# Patient Record
Sex: Male | Born: 1945
Health system: Southern US, Community
[De-identification: ages and names within clinical notes are randomized; demographics above are authoritative.]

## PROBLEM LIST (undated history)

## (undated) DIAGNOSIS — Z8719 Personal history of other diseases of the digestive system: Secondary | ICD-10-CM

## (undated) DIAGNOSIS — E662 Morbid (severe) obesity with alveolar hypoventilation: Principal | ICD-10-CM

## (undated) DIAGNOSIS — M1990 Unspecified osteoarthritis, unspecified site: Secondary | ICD-10-CM

## (undated) DIAGNOSIS — H269 Unspecified cataract: Secondary | ICD-10-CM

## (undated) DIAGNOSIS — K5900 Constipation, unspecified: Secondary | ICD-10-CM

## (undated) DIAGNOSIS — Z87898 Personal history of other specified conditions: Secondary | ICD-10-CM

## (undated) DIAGNOSIS — IMO0002 Reserved for concepts with insufficient information to code with codable children: Secondary | ICD-10-CM

## (undated) DIAGNOSIS — G8929 Other chronic pain: Secondary | ICD-10-CM

## (undated) DIAGNOSIS — D649 Anemia, unspecified: Secondary | ICD-10-CM

## (undated) DIAGNOSIS — T4145XA Adverse effect of unspecified anesthetic, initial encounter: Secondary | ICD-10-CM

## (undated) DIAGNOSIS — M5136 Other intervertebral disc degeneration, lumbar region: Secondary | ICD-10-CM

## (undated) DIAGNOSIS — I10 Essential (primary) hypertension: Secondary | ICD-10-CM

## (undated) DIAGNOSIS — M51369 Other intervertebral disc degeneration, lumbar region without mention of lumbar back pain or lower extremity pain: Secondary | ICD-10-CM

## (undated) DIAGNOSIS — K219 Gastro-esophageal reflux disease without esophagitis: Secondary | ICD-10-CM

## (undated) DIAGNOSIS — I4510 Unspecified right bundle-branch block: Secondary | ICD-10-CM

## (undated) DIAGNOSIS — E1140 Type 2 diabetes mellitus with diabetic neuropathy, unspecified: Secondary | ICD-10-CM

## (undated) DIAGNOSIS — C801 Malignant (primary) neoplasm, unspecified: Secondary | ICD-10-CM

## (undated) DIAGNOSIS — I440 Atrioventricular block, first degree: Secondary | ICD-10-CM

## (undated) DIAGNOSIS — R112 Nausea with vomiting, unspecified: Secondary | ICD-10-CM

## (undated) DIAGNOSIS — G4730 Sleep apnea, unspecified: Secondary | ICD-10-CM

## (undated) DIAGNOSIS — Z9989 Dependence on other enabling machines and devices: Secondary | ICD-10-CM

## (undated) DIAGNOSIS — T7840XA Allergy, unspecified, initial encounter: Secondary | ICD-10-CM

## (undated) DIAGNOSIS — N529 Male erectile dysfunction, unspecified: Secondary | ICD-10-CM

## (undated) DIAGNOSIS — R351 Nocturia: Secondary | ICD-10-CM

## (undated) DIAGNOSIS — M549 Dorsalgia, unspecified: Secondary | ICD-10-CM

## (undated) DIAGNOSIS — T8859XA Other complications of anesthesia, initial encounter: Secondary | ICD-10-CM

## (undated) DIAGNOSIS — K760 Fatty (change of) liver, not elsewhere classified: Secondary | ICD-10-CM

## (undated) DIAGNOSIS — M479 Spondylosis, unspecified: Secondary | ICD-10-CM

## (undated) DIAGNOSIS — H8110 Benign paroxysmal vertigo, unspecified ear: Secondary | ICD-10-CM

## (undated) DIAGNOSIS — R0683 Snoring: Secondary | ICD-10-CM

## (undated) DIAGNOSIS — G4733 Obstructive sleep apnea (adult) (pediatric): Secondary | ICD-10-CM

## (undated) DIAGNOSIS — E119 Type 2 diabetes mellitus without complications: Secondary | ICD-10-CM

## (undated) HISTORY — PX: TONSILLECTOMY: SUR1361

## (undated) HISTORY — DX: Sleep apnea, unspecified: G47.30

## (undated) HISTORY — PX: WISDOM TOOTH EXTRACTION: SHX21

## (undated) HISTORY — DX: Unspecified cataract: H26.9

## (undated) HISTORY — DX: Essential (primary) hypertension: I10

## (undated) HISTORY — DX: Dependence on other enabling machines and devices: Z99.89

## (undated) HISTORY — DX: Snoring: R06.83

## (undated) HISTORY — DX: Morbid (severe) obesity with alveolar hypoventilation: E66.2

## (undated) HISTORY — PX: CARDIOVASCULAR STRESS TEST: SHX262

## (undated) HISTORY — PX: OTHER SURGICAL HISTORY: SHX169

## (undated) HISTORY — DX: Nocturia: R35.1

## (undated) HISTORY — DX: Allergy, unspecified, initial encounter: T78.40XA

## (undated) HISTORY — DX: Dorsalgia, unspecified: M54.9

## (undated) HISTORY — DX: Unspecified osteoarthritis, unspecified site: M19.90

## (undated) HISTORY — PX: BACK SURGERY: SHX140

## (undated) HISTORY — DX: Morbid (severe) obesity due to excess calories: E66.01

## (undated) HISTORY — DX: Obstructive sleep apnea (adult) (pediatric): G47.33

## (undated) HISTORY — DX: Type 2 diabetes mellitus without complications: E11.9

---

## 2004-05-21 HISTORY — PX: COLONOSCOPY: SHX174

## 2005-03-05 ENCOUNTER — Ambulatory Visit: Payer: Self-pay | Admitting: Gastroenterology

## 2005-03-13 ENCOUNTER — Ambulatory Visit: Payer: Self-pay | Admitting: Gastroenterology

## 2005-03-13 ENCOUNTER — Encounter (INDEPENDENT_AMBULATORY_CARE_PROVIDER_SITE_OTHER): Payer: Self-pay | Admitting: Specialist

## 2005-08-20 ENCOUNTER — Encounter: Payer: Self-pay | Admitting: Vascular Surgery

## 2005-08-20 ENCOUNTER — Ambulatory Visit (HOSPITAL_COMMUNITY): Admission: RE | Admit: 2005-08-20 | Discharge: 2005-08-20 | Payer: Self-pay | Admitting: Internal Medicine

## 2010-01-18 ENCOUNTER — Encounter (INDEPENDENT_AMBULATORY_CARE_PROVIDER_SITE_OTHER): Payer: Self-pay | Admitting: *Deleted

## 2010-03-23 ENCOUNTER — Encounter (INDEPENDENT_AMBULATORY_CARE_PROVIDER_SITE_OTHER): Payer: Self-pay | Admitting: *Deleted

## 2010-05-01 ENCOUNTER — Encounter (INDEPENDENT_AMBULATORY_CARE_PROVIDER_SITE_OTHER): Payer: Self-pay | Admitting: *Deleted

## 2010-05-04 ENCOUNTER — Ambulatory Visit: Payer: Self-pay | Admitting: Gastroenterology

## 2010-05-25 ENCOUNTER — Ambulatory Visit
Admission: RE | Admit: 2010-05-25 | Discharge: 2010-05-25 | Payer: Self-pay | Source: Home / Self Care | Attending: Gastroenterology | Admitting: Gastroenterology

## 2010-05-25 ENCOUNTER — Encounter: Payer: Self-pay | Admitting: Gastroenterology

## 2010-05-28 ENCOUNTER — Encounter: Payer: Self-pay | Admitting: Gastroenterology

## 2010-06-22 NOTE — Miscellaneous (Signed)
Summary: [facility] previsit  Clinical Lists Changes  Medications: Added new medication of MOVIPREP 100 GM  SOLR (PEG-KCL-NACL-NASULF-NA ASC-C) As per prep instructions. - Signed Rx of MOVIPREP 100 GM  SOLR (PEG-KCL-NACL-NASULF-NA ASC-C) As per prep instructions.;  #1 x 0;  Signed;  Entered by: Karl Bales RN;  Authorized by: Meryl Dare MD [facility];  Method used: Electronically to [facility] [address], [place], [place], [place]  [address], Ph: [phone], Fax: [phone] Observations: Added new observation of NKA: T (05/04/2010 9:01)    Prescriptions: MOVIPREP 100 GM  SOLR (PEG-KCL-NACL-NASULF-NA ASC-C) As per prep instructions.  #1 x 0   Entered by:   Karl Bales RN   Authorized by:   Meryl Dare MD [facility]   Signed by:   Karl Bales RN on 05/04/2010   Method used:   Electronically to        [facility] (retail)       [address]       [place], [place]  [address]       Ph: [phone]       Fax: [phone]   RxID:   [id number]

## 2010-06-22 NOTE — Letter (Signed)
Summary: Colonoscopy Letter  [facility]  [address] [place], [place] [address]   Phone: [phone]  Fax: [phone]      January 18, 2010 MRN: [id number]   Tommy [name] [address] [place], [place]  [address]   Dear [name],   According to your medical record, it is time for you to schedule a Colonoscopy. The American Cancer Society recommends this procedure as a method to detect early colon cancer. Patients with a family history of colon cancer, or a personal history of colon polyps or inflammatory bowel disease are at increased risk.  This letter has beeen generated based on the recommendations made at the time of your procedure. If you feel that in your particular situation this may no longer apply, please contact our office.  Please call our office at [phone] to schedule this appointment or to update your records at your earliest convenience.  Thank you for cooperating with [place] to provide you with the very best care possible.   Sincerely,  Judie Petit T. Russella Dar, M.D.  [facility] Gastroenterology Division [phone]

## 2010-06-22 NOTE — Letter (Signed)
Summary: Patient Notice- Colon Biospy Results  [facility]  [address] [place], [place] [address]   Phone: [phone]  Fax: [phone]        May 28, 2010 MRN: [id number]    Tommy [name] [address] [place], [place]  [address]    Dear [name],  I am pleased to inform you that the biopsies taken during your recent colonoscopy did not show any evidence of cancer upon pathologic examination. The biopies showed reactive changes.  Continue with the treatment plan as outlined on the day of your      exam.  You should have a repeat colonoscopy examination in 5 years.  Please call us if you are having persistent problems or have questions about your condition that have not been fully answered at this time.  Sincerely,  Meryl Dare MD [facility]  This letter has been electronically signed by your physician.  Appended Document: Patient Notice- Colon Biospy Results Letter mailed

## 2010-06-22 NOTE — Procedures (Signed)
Summary: Colonoscopy  Patient: Tommy [name] Note: All result statuses are Final unless otherwise noted.  Tests: (1) Colonoscopy (COL)   COL Colonoscopy           DONE (C)     [facility]     [address]     [place], [place]  [address]           COLONOSCOPY PROCEDURE REPORT           PATIENT:  Tommy [name], Tommy [name]  MR#:  [id number]     BIRTHDATE:  30-Jul-1945, 64 yrs. old  GENDER:  male     ENDOSCOPIST:  Judie Petit T. Russella Dar, MD, [facility]           PROCEDURE DATE:  05/25/2010     PROCEDURE:  Colonoscopy with biopsy     ASA CLASS:  Class II     INDICATIONS:  1) surveillance and high-risk screening  2) history     of adenomatous colon polyps: 10/2001     MEDICATIONS:   Fentanyl 125 mcg IV, Versed 10 mg IV, Benadryl 25     mg IV           DESCRIPTION OF PROCEDURE:   After the risks benefits and     alternatives of the procedure were thoroughly explained, informed     consent was obtained.  Digital rectal exam was performed and     revealed no abnormalities.   The LB CF-[id number] endoscope     was introduced through the anus and advanced to the cecum, which     was identified by both the appendix and ileocecal valve, without     limitations.  The quality of the prep was good, using MoviPrep.     The instrument was then slowly withdrawn as the colon was fully     examined.     <<PROCEDUREIMAGES>>     FINDINGS:  Several small ulcers at the ileocecal valve. They were     benign appearing. Multiple biopsies were obtained and sent to     pathology.  Moderate diverticulosis was found in the sigmoid     colon. This was otherwise a normal examination of the colon.     Retroflexed views in the rectum revealed no abnormalities. The     time to cecum =  9  minutes. The scope was then withdrawn (time =     12.75  min) from the patient and the procedure completed.           COMPLICATIONS:  None           ENDOSCOPIC IMPRESSION:     1) Ulcers at the ileocecal valve     2)  Moderate diverticulosis in the sigmoid colon           RECOMMENDATIONS:     1) Await pathology results     2) High fiber diet with liberal fluid intake.     3) Repeat Colonoscopy in 5 years.     4) Consider MAC sedation for future procedures           Amira Podolak T. Russella Dar, MD, [facility]           CC: Creola Corn, MD           n.     REVISED:  05/25/2010 09:43 AM     eSIGNED:   Judie Petit T. Surie Suchocki at 05/25/2010 09:43 AM           Hyacinth Meeker, [id number]  Note:  An exclamation mark (!) indicates a result that was not dispersed into the flowsheet. Document Creation Date: 05/25/2010 9:43 AM _______________________________________________________________________  (1) Order result status: Final Collection or observation date-time: 05/25/2010 09:30 Requested date-time:  Receipt date-time:  Reported date-time:  Referring Physician:   Ordering Physician: Claudette Head [phone]) Specimen Source:  Source: Launa Grill Order Number: [id number] Lab site:   Appended Document: Colonoscopy     Procedures Next Due Date:    Colonoscopy: 05/2015

## 2010-06-22 NOTE — Letter (Signed)
Summary: [facility] Instructions  [facility]  [address] [place], [place] [address]   Phone: [phone]  Fax: [phone]       Tommy [name]    [birth year]    MRN: [id number]        Procedure Day /Date:  Thursday 05/25/2010     Arrival Time: 8:30 am     Procedure Time: 9:30 am     Location of Procedure:                    _x_  [facility] (4th Floor)                        PREPARATION FOR COLONOSCOPY WITH MOVIPREP   Starting 5 days prior to your procedure Saturday 12/31 do not eat nuts, seeds, popcorn, corn, beans, peas,  salads, or any raw vegetables.  Do not take any fiber supplements (e.g. Metamucil, Citrucel, and Benefiber).  THE DAY BEFORE YOUR PROCEDURE         DATE: Wednesday 1/4  1.  Drink clear liquids the entire day-NO SOLID FOOD  2.  Do not drink anything colored red or purple.  Avoid juices with pulp.  No orange juice.  3.  Drink at least 64 oz. (8 glasses) of fluid/clear liquids during the day to prevent dehydration and help the prep work efficiently.  CLEAR LIQUIDS INCLUDE: Water Jello Ice Popsicles Tea (sugar ok, no milk/cream) Powdered fruit flavored drinks Coffee (sugar ok, no milk/cream) Gatorade Juice: apple, white grape, white cranberry  Lemonade Clear bullion, consomm, broth Carbonated beverages (any kind) Strained chicken noodle soup Hard Candy                             4.  In the morning, mix first dose of MoviPrep solution:    Empty 1 Pouch A and 1 Pouch B into the disposable container    Add lukewarm drinking water to the top line of the container. Mix to dissolve    Refrigerate (mixed solution should be used within 24 hrs)  5.  Begin drinking the prep at 5:00 p.m. The MoviPrep container is divided by 4 marks.   Every 15 minutes drink the solution down to the next mark (approximately 8 oz) until the full liter is complete.   6.  Follow completed prep with 16 oz of clear liquid of your choice  (Nothing red or purple).  Continue to drink clear liquids until bedtime.  7.  Before going to bed, mix second dose of MoviPrep solution:    Empty 1 Pouch A and 1 Pouch B into the disposable container    Add lukewarm drinking water to the top line of the container. Mix to dissolve    Refrigerate  THE DAY OF YOUR PROCEDURE      DATE: Thursday 1/5  Beginning at 4:30 a.m. (5 hours before procedure):         1. Every 15 minutes, drink the solution down to the next mark (approx 8 oz) until the full liter is complete.  2. Follow completed prep with 16 oz. of clear liquid of your choice.    3. You may drink clear liquids until 7:30 am (2 HOURS BEFORE PROCEDURE).   MEDICATION INSTRUCTIONS  Unless otherwise instructed, you should take regular prescription medications with a small sip of water   as early as possible the morning of your procedure.  OTHER INSTRUCTIONS  You will need a responsible adult at least 65 years of age to accompany you and drive you home.   This person must remain in the waiting room during your procedure.  Wear loose fitting clothing that is easily removed.  Leave jewelry and other valuables at home.  However, you may wish to bring a book to read or  an iPod/MP3 player to listen to music as you wait for your procedure to start.  Remove all body piercing jewelry and leave at home.  Total time from sign-in until discharge is approximately 2-3 hours.  You should go home directly after your procedure and rest.  You can resume normal activities the  day after your procedure.  The day of your procedure you should not:   Drive   Make legal decisions   Operate machinery   Drink alcohol   Return to work  You will receive specific instructions about eating, activities and medications before you leave.    The above instructions have been reviewed and explained to me by   Karl Bales RN  May 04, 2010 9:24 AM    I fully understand  and can verbalize these instructions _____________________________ Date _________

## 2010-06-22 NOTE — Letter (Signed)
Summary: Pre Visit Letter Revised  [facility]  [address] [place], [place] [address]   Phone: [phone]  Fax: [phone]        03/23/2010 MRN: [id number] Tommy [name] [address] [place], [place]  [address]             Procedure Date:  05-25-10   Welcome to the Gastroenterology Division at [facility].    You are scheduled to see a nurse for your pre-procedure visit on 05-04-10 at 9:00a.m. on the 3rd floor at [facility], [address]  We ask that you try to arrive at our office 15 minutes prior to your appointment time to allow for check-in.  Please take a minute to review the attached form.  If you answer "Yes" to one or more of the questions on the first page, we ask that you call the person listed at your earliest opportunity.  If you answer "No" to all of the questions, please complete the rest of the form and bring it to your appointment.    Your nurse visit will consist of discussing your medical and surgical history, your immediate family medical history, and your medications.   If you are unable to list all of your medications on the form, please bring the medication bottles to your appointment and we will list them.  We will need to be aware of both prescribed and over the counter drugs.  We will need to know exact dosage information as well.    Please be prepared to read and sign documents such as consent forms, a financial agreement, and acknowledgement forms.  If necessary, and with your consent, a friend or relative is welcome to sit-in on the nurse visit with you.  Please bring your insurance card so that we may make a copy of it.  If your insurance requires a referral to see a specialist, please bring your referral form from your primary care physician.  No co-pay is required for this nurse visit.     If you cannot keep your appointment, please call [phone] to cancel or reschedule prior to your appointment date.  This allows [place]  the opportunity to schedule an appointment for another patient in need of care.    Thank you for choosing [facility] for your medical needs.  We appreciate the opportunity to care for you.  Please visit [place] at our website  to learn more about our practice.  Sincerely, The Gastroenterology Division

## 2010-06-29 ENCOUNTER — Encounter (HOSPITAL_BASED_OUTPATIENT_CLINIC_OR_DEPARTMENT_OTHER)
Admission: RE | Admit: 2010-06-29 | Discharge: 2010-06-29 | Disposition: A | Discharge status: Home / Self Care | Payer: Medicare Other | Source: Ambulatory Visit | Attending: General Surgery | Admitting: General Surgery

## 2010-06-29 ENCOUNTER — Other Ambulatory Visit: Payer: Self-pay | Admitting: General Surgery

## 2010-06-29 ENCOUNTER — Ambulatory Visit
Admission: RE | Admit: 2010-06-29 | Discharge: 2010-06-29 | Disposition: A | Discharge status: Home / Self Care | Payer: Medicare Other | Source: Ambulatory Visit | Attending: General Surgery | Admitting: General Surgery

## 2010-06-29 DIAGNOSIS — Z01811 Encounter for preprocedural respiratory examination: Secondary | ICD-10-CM

## 2010-06-29 DIAGNOSIS — Z01812 Encounter for preprocedural laboratory examination: Secondary | ICD-10-CM | POA: Insufficient documentation

## 2010-06-29 DIAGNOSIS — Z01810 Encounter for preprocedural cardiovascular examination: Secondary | ICD-10-CM | POA: Insufficient documentation

## 2010-06-29 LAB — BASIC METABOLIC PANEL
BUN: 13 mg/dL (ref 6–23)
Chloride: 103 mEq/L (ref 96–112)
GFR calc Af Amer: >60 mL/min (ref >60)
Potassium: 3.4 mEq/L — ABNORMAL LOW (ref 3.5–5.1)

## 2010-06-29 LAB — DIFFERENTIAL
Eosinophils Absolute: 0.2 10*3/uL (ref 0.0–0.7)
Lymphs Abs: 1.4 10*3/uL (ref 0.7–4.0)
Neutrophils Relative %: 66 % (ref 43–77)

## 2010-06-29 LAB — CBC
MCV: 88.6 fL (ref 78.0–100.0)
Platelets: 252 10*3/uL (ref 150–400)
RBC: 4.74 MIL/uL (ref 4.22–5.81)
WBC: 7.1 10*3/uL (ref 4.0–10.5)

## 2010-07-04 ENCOUNTER — Ambulatory Visit (HOSPITAL_BASED_OUTPATIENT_CLINIC_OR_DEPARTMENT_OTHER)
Admission: RE | Admit: 2010-07-04 | Discharge: 2010-07-04 | Disposition: A | Discharge status: Home / Self Care | Payer: Medicare Other | Attending: General Surgery | Admitting: General Surgery

## 2010-07-04 ENCOUNTER — Other Ambulatory Visit: Payer: Self-pay | Admitting: General Surgery

## 2010-07-04 DIAGNOSIS — I10 Essential (primary) hypertension: Secondary | ICD-10-CM | POA: Insufficient documentation

## 2010-07-04 DIAGNOSIS — Z01810 Encounter for preprocedural cardiovascular examination: Secondary | ICD-10-CM | POA: Insufficient documentation

## 2010-07-04 DIAGNOSIS — D1739 Benign lipomatous neoplasm of skin and subcutaneous tissue of other sites: Secondary | ICD-10-CM | POA: Insufficient documentation

## 2010-07-04 DIAGNOSIS — K219 Gastro-esophageal reflux disease without esophagitis: Secondary | ICD-10-CM | POA: Insufficient documentation

## 2010-07-04 DIAGNOSIS — Z01812 Encounter for preprocedural laboratory examination: Secondary | ICD-10-CM | POA: Insufficient documentation

## 2010-07-04 LAB — POCT HEMOGLOBIN-HEMACUE: Hemoglobin: 13.9 g/dL (ref 13.0–17.0)

## 2010-07-10 NOTE — Op Note (Addendum)
  NAME:  ODA, LANSDOWNE NO.:  [id number]  MEDICAL RECORD NO.:  [id number]          PATIENT TYPE:  AMB  LOCATION:  DSC                          FACILITY:  [facility]  PHYSICIAN:  Cherylynn Ridges, M.D.    DATE OF BIRTH:  11-28-1945  DATE OF PROCEDURE:  07/04/2010 DATE OF DISCHARGE:                              OPERATIVE REPORT   PREOPERATIVE DIAGNOSIS:  Large left posterior thigh mass.  POSTOPERATIVE DIAGNOSIS:  Interdigitating large greater 10 cm left posterior thigh lipoma.  PROCEDURE:  Excision of a left posterior thigh lipoma.  SURGEON:  Cherylynn Ridges, MD  ANESTHESIA:  General with laryngeal airway.  ESTIMATED BLOOD LOSS:  Less than 20 mL.  COMPLICATIONS:  None.  CONDITION:  Stable.  FINDINGS:  Interdigitating large greater than 10 cm posterior thigh lipoma.  OPERATION:  The patient was taken to the operating room, placed on table in supine position.  After an adequate general laryngeal airway anesthetic was administered, he was prepped and draped in usual sterile manner in the lithotomy position.  After proper time-out was done identifying the patient and the procedure to be performed, we went ahead and marked the area of the large lipoma that was lateral to the scrotum on the left side.  The scrotum was kept away with a scrotal sling.  Prep was adequate.  Once we marked the area, used a 15 blade to make an oval incision, sort of diagonally from the medial aspect of the thigh laterally and posteriorly.  This oval incision had a maximal width of about 2 cm.  We took with it a significant amount of redundant skin.  In the subcutaneous tissue, this large interdigitating multifaceted lipoma was excised from the surrounding tissue using hemostat clamps in order to dissect that of little pocket that went throughout the subcu.  We removed all the inner digitation there were obvious and noted.  We obtained hemostasis with electrocautery.  We removed the  specimen and sent it separately.  We then reapproximated the area using a deep subcutaneous stitch of 2-0 Vicryl, more superficial layer of 3-0 Vicryl then the skin was closed using stainless steel staples.  A sort of an S-shaped incision headed towards the scrotum, measured approximately 12 cm long. All counts were correct including needles, sponges, and instruments. Sterile dressing was applied.     Cherylynn Ridges, M.D.     JOW/MEDQ  D:  07/04/2010  T:  07/05/2010  Job:  [id number]  Electronically Signed by Jimmye Norman M.D. on 07/10/2010 12:50:14 PM

## 2012-05-21 DIAGNOSIS — M479 Spondylosis, unspecified: Secondary | ICD-10-CM

## 2012-05-21 HISTORY — DX: Spondylosis, unspecified: M47.9

## 2012-05-26 ENCOUNTER — Ambulatory Visit
Admission: RE | Admit: 2012-05-26 | Discharge: 2012-05-26 | Disposition: A | Discharge status: Home / Self Care | Payer: Medicare Other | Source: Ambulatory Visit | Attending: Orthopedic Surgery | Admitting: Orthopedic Surgery

## 2012-05-26 ENCOUNTER — Other Ambulatory Visit: Payer: Self-pay | Admitting: Orthopedic Surgery

## 2012-05-26 DIAGNOSIS — M545 Low back pain, unspecified: Secondary | ICD-10-CM

## 2012-05-26 MED ORDER — GADOBENATE DIMEGLUMINE 529 MG/ML IV SOLN
20.0000 mL | Freq: Once | INTRAVENOUS | Status: AC | PRN
  Administered 2012-05-26: 20 mL via INTRAVENOUS

## 2012-05-27 ENCOUNTER — Other Ambulatory Visit: Payer: Medicare Other

## 2012-06-10 ENCOUNTER — Other Ambulatory Visit: Payer: Self-pay

## 2012-06-13 ENCOUNTER — Encounter (HOSPITAL_COMMUNITY): Payer: Self-pay | Admitting: Pharmacy Technician

## 2012-06-13 DIAGNOSIS — M5116 Intervertebral disc disorders with radiculopathy, lumbar region: Secondary | ICD-10-CM | POA: Diagnosis present

## 2012-06-13 NOTE — H&P (Signed)
History of Present Illness The patient is a 67 year old male who presents today for follow up of their back. The patient is being followed for their left-sided (worse) back pain. The patient states that they are doing poorly. The following medication has been used for pain control: Hydrocodone (5/325 PCP). The patient presents today following MRI. Note for "Follow-up back": Fell to knees in therapy this morning.    Subjective Transcription  Tommy [name] returns today for follow up with his MRI. He continues to have significant back pain, numbness, tingling and severe left leg pain. He states that his symptoms have gotten worse since Thanksgiving. The remainder of his past medical, surgical, family, social history is unchanged.    Allergies No Known Drug Allergies.   Social History Tobacco use. never smoker   Medication History TraMADol HCl (50MG  Tablet, 1 (one) Tablet Oral q 8hrs prn pain, Taken starting 05/23/2012) Active. (ddb/lawl via escribe) Diovan HCT (320-25MG  Tablet, 1 Oral daily) Active. Norvasc (10MG  Tablet, 1 Oral daily) Active. Singulair (10MG  Tablet, Oral daily) Active. Nasonex (50MCG/ACT Suspension, Nasal) Active. Atrovent (0.06% Solution, Nasal as needed) Active. Naproxen DR (500MG  Tablet DR, Oral two times daily) Active. Aspirin (81MG  Tablet, 1 (one) Oral) Active. Glucosamine Complex (1 (one) Oral) Active. Fiber Capsule ( daily) Active. Multiple Vitamin (1 (one) Oral) Active. Methocarbamol (750MG  Tablet, 1 Oral q 6-8 hrs prn) Active. Hydrocodone-Acetaminophen (10-650MG  Tablet, 1 Oral q 6-8 hrs prn pain) Active.   Objective Transcription  He is a pleasant gentleman who appears his stated age in no acute distress. He is alert and oriented times three. He has no shortness of breath or chest pain. Lungs clear to auscultation. Heart regular rate and rhythm, no rubs, gallops or murmurs on my exam. Abdomen is soft, nontender. No history of incontinence of  bowel or bladder. Peripheral pulses are 2+ and intact. Compartments soft, nontender. He has significant left quad and hip flexor weakness both being about 3+ to 4-/5. Tibialis anterior, EHL, gastrocnemius are grossly intact. Right lower extremity is asymptomatic with 5/5 strength. He has no real hip, knee or ankle pain with joint range of motion.    RADIOGRAPHS:  MRI from 05/26/12 demonstrates significant degenerative discs at L4-5 with Modic endplate changes causing moderate spinal stenosis. He also has a disc herniation at L2-3 on the left side causing compression of the traversing L3 nerve roots.    Plans Transcription  At this point in time we have had a long discussion about his disc herniation and his degenerative disc disease. He states that the left thigh pain and weakness is his major source of problem. At this point while his degenerated disc can cause problems for him clinically he is having and I believe the L2-3 disc herniation to the left is his principle source of pathology. We have discussed performing a lumbar decompression discectomy at L2-3 to alleviate the pressure on the L3 nerve root which I think is the best treatment plan. He may require a fusion surgery later on at that L4-5 level but at this point I do think his principle source of disability is the new disc herniation at L2-3. We reviewed the risks which include infection, bleeding, nerve damage, death, stroke, paralysis, failure to heal, need for further surgery, ongoing or worse pain, need for fusion surgery, no improvements in his quality of life or pain. He has expressed an understanding of the risks and benefits and the surgical procedure itself and rationale for said procedure. We will proceed with this  in a timely fashion.  Medical Clearance provided by Dr Thornell Sartorius, MD

## 2012-06-17 ENCOUNTER — Ambulatory Visit (HOSPITAL_COMMUNITY)
Admission: RE | Admit: 2012-06-17 | Discharge: 2012-06-17 | Disposition: A | Discharge status: Home / Self Care | Payer: Medicare Other | Source: Ambulatory Visit | Attending: Anesthesiology | Admitting: Anesthesiology

## 2012-06-17 ENCOUNTER — Encounter (HOSPITAL_COMMUNITY)
Admission: RE | Admit: 2012-06-17 | Discharge: 2012-06-17 | Disposition: A | Discharge status: Home / Self Care | Payer: Medicare Other | Source: Ambulatory Visit | Attending: Orthopedic Surgery | Admitting: Orthopedic Surgery

## 2012-06-17 ENCOUNTER — Encounter (HOSPITAL_COMMUNITY): Payer: Self-pay

## 2012-06-17 HISTORY — DX: Anemia, unspecified: D64.9

## 2012-06-17 HISTORY — DX: Essential (primary) hypertension: I10

## 2012-06-17 HISTORY — DX: Personal history of other diseases of the digestive system: Z87.19

## 2012-06-17 HISTORY — DX: Malignant (primary) neoplasm, unspecified: C80.1

## 2012-06-17 HISTORY — DX: Reserved for concepts with insufficient information to code with codable children: IMO0002

## 2012-06-17 HISTORY — DX: Dorsalgia, unspecified: M54.9

## 2012-06-17 HISTORY — DX: Other chronic pain: G89.29

## 2012-06-17 HISTORY — DX: Gastro-esophageal reflux disease without esophagitis: K21.9

## 2012-06-17 LAB — BASIC METABOLIC PANEL
BUN: 17 mg/dL (ref 6–23)
Chloride: 100 mEq/L (ref 96–112)
Creatinine, Ser: 0.79 mg/dL (ref 0.50–1.35)
GFR calc Af Amer: >90 mL/min (ref >90)
GFR calc non Af Amer: >90 mL/min (ref >90)

## 2012-06-17 LAB — CBC
HCT: 41.8 % (ref 39.0–52.0)
MCHC: 33.3 g/dL (ref 30.0–36.0)
Platelets: 256 10*3/uL (ref 150–400)
RDW: 14.4 % (ref 11.5–15.5)
WBC: 10 10*3/uL (ref 4.0–10.5)

## 2012-06-17 LAB — SURGICAL PCR SCREEN
MRSA, PCR: NEGATIVE
Staphylococcus aureus: NEGATIVE

## 2012-06-17 NOTE — Progress Notes (Signed)
06/17/12 1421  OBSTRUCTIVE SLEEP APNEA  Have you ever been diagnosed with sleep apnea through a sleep study? No  Do you snore loudly (loud enough to be heard through closed doors)?  0  Do you often feel tired, fatigued, or sleepy during the daytime? 1  Has anyone observed you stop breathing during your sleep? 1  Do you have, or are you being treated for high blood pressure? 1  BMI more than 35 kg/m2? 1  Age over 67 years old? 1  Neck circumference greater than 40 cm/18 inches? 0 (20 inches)  Obstructive Sleep Apnea Score 5   Score 4 or greater  Results sent to PCP

## 2012-06-17 NOTE — Progress Notes (Signed)
Contacted Dr. Shon Baton office, left message with surgery scheduler, Lavonna Rua, unable to request order for TED hose, do not carry patients size.

## 2012-06-17 NOTE — Consult Note (Signed)
Anesthesia consult:  Patient is a 67 year old male scheduled for L2-3 left diskectomy on 06/19/12 by Dr. Shon Baton  History includes HTN, GERD, hiatal hernia, anemia, melanoma, morbid obesity (BMI 46), fatty liver, remote history of prior back surgery, excision of left thigh lipoma on 07/04/10.  OSA screening score was a 5. PCP is Dr. Timothy Lasso.  Patient reports that Dr. Timothy Lasso is aware of planned procedure.  EKG on 06/17/12 showed NSR, right BBB, LAFB, bifascicular block.  Right BBB appears new since 06/29/10.  Patient denies recent cardiac testing.  His last stress test was ~ 10 years ago. (No previous EKGs received from PCP - "no cardio" records per office.)  CXR showed no acute disease of the chest.  Pre-operative CBC, BMET noted.  Patient denies chest pain, SOB.  He has not been very active since Thanksgiving due to severe back and LLE pain.  Prior to that he was able to walk one mile and was able to due some digging and heavy lifting when enclosing his carport.    Exam shows a pleasant, obese male in no acute distress.  Heart RRR, no murmurs noted.  Lungs clear.  Trace pre-tibial edema.  No carotid bruits noted.  I reviewed history and EKG findings with anesthesiologist Dr. Michelle Piper.  Patient is currently asymptomatic from a CV standpoint and was walking up to a mile prior to his back pain two months ago.  Anticipate he can proceed if no significant change in his status.  Shonna Chock, PA-C 06/18/12 1555

## 2012-06-17 NOTE — Pre-Procedure Instructions (Signed)
NATTHEW MARLATT  06/17/2012   Your procedure is scheduled on:  Thursday June 19, 2012  Report to [facility] at 1:30PM.  Call this number if you have problems the morning of surgery: [phone]   Remember:   Do not eat food or drink liquids after midnight.   Take these medicines the morning of surgery with A SIP OF WATER: amlodipine, nasonex, singulair, oxycodone,    Do not wear jewelry, make-up or nail polish.  Do not wear lotions, powders, or perfumes.  Do not shave 48 hours prior to surgery. Men may shave face and neck.  Do not bring valuables to the hospital.  Contacts, dentures or bridgework may not be worn into surgery.  Leave suitcase in the car. After surgery it may be brought to your room.  For patients admitted to the hospital, checkout time is 11:00 AM the day of  discharge.   Patients discharged the day of surgery will not be allowed to drive  home.  Name and phone number of your driver: family / friend  Special Instructions: Shower using CHG 2 nights before surgery and the night before surgery.  If you shower the day of surgery use CHG.  Use special wash - you have one bottle of CHG for all showers.  You should use approximately 1/3 of the bottle for each shower.   Please read over the following fact sheets that you were given: Pain Booklet, Coughing and Deep Breathing, MRSA Information and Surgical Site Infection Prevention

## 2012-06-18 MED ORDER — DEXTROSE 5 % IV SOLN
3.0000 g | INTRAVENOUS | Status: DC
  Filled 2012-06-18: qty 3000

## 2012-06-18 MED ORDER — ACETAMINOPHEN 10 MG/ML IV SOLN
1000.0000 mg | Freq: Once | INTRAVENOUS | Status: DC
  Filled 2012-06-18: qty 100

## 2012-06-19 ENCOUNTER — Encounter (HOSPITAL_COMMUNITY): Payer: Self-pay | Admitting: Vascular Surgery

## 2012-06-19 ENCOUNTER — Encounter (HOSPITAL_COMMUNITY)
Admission: RE | Disposition: A | Discharge status: Home / Self Care | Payer: Self-pay | Source: Ambulatory Visit | Attending: Orthopedic Surgery

## 2012-06-19 ENCOUNTER — Encounter (HOSPITAL_COMMUNITY): Payer: Self-pay | Admitting: *Deleted

## 2012-06-19 ENCOUNTER — Observation Stay (HOSPITAL_COMMUNITY)
Admission: RE | Admit: 2012-06-19 | Discharge: 2012-06-20 | DRG: 490 | Disposition: A | Discharge status: Home / Self Care | Payer: Medicare Other | Source: Ambulatory Visit | Attending: Orthopedic Surgery | Admitting: Orthopedic Surgery

## 2012-06-19 ENCOUNTER — Ambulatory Visit (HOSPITAL_COMMUNITY): Payer: Medicare Other

## 2012-06-19 ENCOUNTER — Ambulatory Visit (HOSPITAL_COMMUNITY): Payer: Medicare Other | Admitting: Vascular Surgery

## 2012-06-19 DIAGNOSIS — M51379 Other intervertebral disc degeneration, lumbosacral region without mention of lumbar back pain or lower extremity pain: Secondary | ICD-10-CM | POA: Insufficient documentation

## 2012-06-19 DIAGNOSIS — Z01810 Encounter for preprocedural cardiovascular examination: Secondary | ICD-10-CM | POA: Insufficient documentation

## 2012-06-19 DIAGNOSIS — M5137 Other intervertebral disc degeneration, lumbosacral region: Secondary | ICD-10-CM | POA: Insufficient documentation

## 2012-06-19 DIAGNOSIS — Z01818 Encounter for other preprocedural examination: Secondary | ICD-10-CM | POA: Insufficient documentation

## 2012-06-19 DIAGNOSIS — M5126 Other intervertebral disc displacement, lumbar region: Principal | ICD-10-CM | POA: Insufficient documentation

## 2012-06-19 DIAGNOSIS — Z01812 Encounter for preprocedural laboratory examination: Secondary | ICD-10-CM | POA: Insufficient documentation

## 2012-06-19 DIAGNOSIS — M5116 Intervertebral disc disorders with radiculopathy, lumbar region: Secondary | ICD-10-CM | POA: Diagnosis present

## 2012-06-19 DIAGNOSIS — I10 Essential (primary) hypertension: Secondary | ICD-10-CM | POA: Insufficient documentation

## 2012-06-19 HISTORY — PX: LUMBAR LAMINECTOMY/DECOMPRESSION MICRODISCECTOMY: SHX5026

## 2012-06-19 SURGERY — LUMBAR LAMINECTOMY/DECOMPRESSION MICRODISCECTOMY 1 LEVEL
Anesthesia: General | Site: Spine Lumbar | Laterality: Left | Wound class: Clean

## 2012-06-19 MED ORDER — PROPOFOL 10 MG/ML IV BOLUS
INTRAVENOUS | Status: DC | PRN
  Administered 2012-06-19: 280 mg via INTRAVENOUS
  Administered 2012-06-19: 50 mg via INTRAVENOUS

## 2012-06-19 MED ORDER — HEMOSTATIC AGENTS (NO CHARGE) OPTIME
TOPICAL | Status: DC | PRN
  Administered 2012-06-19: 1 via TOPICAL

## 2012-06-19 MED ORDER — VALSARTAN 160 MG PO TABS
320.0000 mg | ORAL_TABLET | Freq: Every day | ORAL | Status: DC
  Administered 2012-06-20: 320 mg via ORAL
  Filled 2012-06-19: qty 2

## 2012-06-19 MED ORDER — SODIUM CHLORIDE 0.9 % IV SOLN
10.0000 mg | INTRAVENOUS | Status: DC | PRN
  Administered 2012-06-19: 50 ug/min via INTRAVENOUS

## 2012-06-19 MED ORDER — ACETAMINOPHEN 10 MG/ML IV SOLN
INTRAVENOUS | Status: AC
  Administered 2012-06-19: 1000 mg via INTRAVENOUS
  Filled 2012-06-19: qty 100

## 2012-06-19 MED ORDER — BUPIVACAINE-EPINEPHRINE PF 0.25-1:200000 % IJ SOLN
INTRAMUSCULAR | Status: AC
  Filled 2012-06-19: qty 30

## 2012-06-19 MED ORDER — METHOCARBAMOL 500 MG PO TABS
500.0000 mg | ORAL_TABLET | Freq: Four times a day (QID) | ORAL | Status: DC | PRN
  Administered 2012-06-20: 500 mg via ORAL
  Filled 2012-06-19 (×2): qty 1

## 2012-06-19 MED ORDER — SODIUM CHLORIDE 0.9 % IJ SOLN: 3.0000 mL | INTRAMUSCULAR | Status: DC | PRN

## 2012-06-19 MED ORDER — MIDAZOLAM HCL 2 MG/2ML IJ SOLN
INTRAMUSCULAR | Status: AC
  Filled 2012-06-19: qty 2

## 2012-06-19 MED ORDER — THROMBIN 20000 UNITS EX SOLR
CUTANEOUS | Status: DC | PRN
  Administered 2012-06-19: 18:00:00

## 2012-06-19 MED ORDER — DEXAMETHASONE SODIUM PHOSPHATE 4 MG/ML IJ SOLN
4.0000 mg | Freq: Four times a day (QID) | INTRAMUSCULAR | Status: DC
  Filled 2012-06-19 (×6): qty 1

## 2012-06-19 MED ORDER — BUPIVACAINE-EPINEPHRINE 0.25% -1:200000 IJ SOLN
INTRAMUSCULAR | Status: DC | PRN
  Administered 2012-06-19: 10 mL

## 2012-06-19 MED ORDER — GLYCOPYRROLATE 0.2 MG/ML IJ SOLN
INTRAMUSCULAR | Status: DC | PRN
  Administered 2012-06-19: .8 mg via INTRAVENOUS

## 2012-06-19 MED ORDER — MIDAZOLAM HCL 2 MG/2ML IJ SOLN
2.0000 mg | Freq: Once | INTRAMUSCULAR | Status: AC
  Administered 2012-06-19: 2 mg via INTRAVENOUS

## 2012-06-19 MED ORDER — OXYCODONE HCL 5 MG/5ML PO SOLN: 5.0000 mg | Freq: Once | ORAL | Status: DC | PRN

## 2012-06-19 MED ORDER — ONDANSETRON HCL 4 MG/2ML IJ SOLN
INTRAMUSCULAR | Status: AC
  Filled 2012-06-19: qty 2

## 2012-06-19 MED ORDER — MORPHINE SULFATE 2 MG/ML IJ SOLN
1.0000 mg | INTRAMUSCULAR | Status: DC | PRN
  Administered 2012-06-20: 2 mg via INTRAVENOUS
  Filled 2012-06-19: qty 1

## 2012-06-19 MED ORDER — FENTANYL CITRATE 0.05 MG/ML IJ SOLN
50.0000 ug | Freq: Once | INTRAMUSCULAR | Status: AC
  Administered 2012-06-19: 100 ug via INTRAVENOUS

## 2012-06-19 MED ORDER — THROMBIN 20000 UNITS EX SOLR
CUTANEOUS | Status: AC
  Filled 2012-06-19: qty 20000

## 2012-06-19 MED ORDER — 0.9 % SODIUM CHLORIDE (POUR BTL) OPTIME
TOPICAL | Status: DC | PRN
  Administered 2012-06-19: 1000 mL

## 2012-06-19 MED ORDER — DEXTROSE 5 % IV SOLN
3.0000 g | INTRAVENOUS | Status: DC
  Filled 2012-06-19: qty 3000

## 2012-06-19 MED ORDER — OXYCODONE HCL 5 MG PO TABS: 5.0000 mg | ORAL_TABLET | Freq: Once | ORAL | Status: DC | PRN

## 2012-06-19 MED ORDER — OXYCODONE HCL 5 MG PO TABS: 10.0000 mg | ORAL_TABLET | ORAL | Status: DC | PRN

## 2012-06-19 MED ORDER — METHOCARBAMOL 100 MG/ML IJ SOLN
500.0000 mg | Freq: Four times a day (QID) | INTRAMUSCULAR | Status: DC | PRN
  Filled 2012-06-19: qty 5

## 2012-06-19 MED ORDER — HYDROMORPHONE HCL PF 1 MG/ML IJ SOLN
0.2500 mg | INTRAMUSCULAR | Status: DC | PRN
  Administered 2012-06-19 (×2): 0.5 mg via INTRAVENOUS

## 2012-06-19 MED ORDER — AMLODIPINE BESYLATE 10 MG PO TABS
10.0000 mg | ORAL_TABLET | Freq: Every morning | ORAL | Status: DC
  Administered 2012-06-20: 10 mg via ORAL
  Filled 2012-06-19: qty 1

## 2012-06-19 MED ORDER — PHENYLEPHRINE HCL 10 MG/ML IJ SOLN
INTRAMUSCULAR | Status: DC | PRN
  Administered 2012-06-19 (×2): 120 ug via INTRAVENOUS
  Administered 2012-06-19: 160 ug via INTRAVENOUS

## 2012-06-19 MED ORDER — FENTANYL CITRATE 0.05 MG/ML IJ SOLN
INTRAMUSCULAR | Status: AC
  Administered 2012-06-19: 100 ug via INTRAVENOUS
  Filled 2012-06-19: qty 2

## 2012-06-19 MED ORDER — ACETAMINOPHEN 10 MG/ML IV SOLN
1000.0000 mg | Freq: Four times a day (QID) | INTRAVENOUS | Status: DC
  Administered 2012-06-19 – 2012-06-20 (×3): 1000 mg via INTRAVENOUS
  Filled 2012-06-19 (×4): qty 100

## 2012-06-19 MED ORDER — DEXAMETHASONE 4 MG PO TABS
4.0000 mg | ORAL_TABLET | Freq: Four times a day (QID) | ORAL | Status: DC
  Administered 2012-06-19 – 2012-06-20 (×3): 4 mg via ORAL
  Filled 2012-06-19 (×6): qty 1

## 2012-06-19 MED ORDER — LACTATED RINGERS IV SOLN: INTRAVENOUS | Status: DC

## 2012-06-19 MED ORDER — HYDROMORPHONE HCL PF 1 MG/ML IJ SOLN
INTRAMUSCULAR | Status: AC
  Administered 2012-06-19: 0.5 mg via INTRAVENOUS
  Filled 2012-06-19: qty 1

## 2012-06-19 MED ORDER — HYDROCHLOROTHIAZIDE 25 MG PO TABS
25.0000 mg | ORAL_TABLET | Freq: Every day | ORAL | Status: DC
  Administered 2012-06-20: 25 mg via ORAL
  Filled 2012-06-19: qty 1

## 2012-06-19 MED ORDER — NEOSTIGMINE METHYLSULFATE 1 MG/ML IJ SOLN
INTRAMUSCULAR | Status: DC | PRN
  Administered 2012-06-19: 5 mg via INTRAVENOUS

## 2012-06-19 MED ORDER — PROMETHAZINE HCL 25 MG/ML IJ SOLN: 6.2500 mg | INTRAMUSCULAR | Status: DC | PRN

## 2012-06-19 MED ORDER — CEFAZOLIN SODIUM 1-5 GM-% IV SOLN
1.0000 g | Freq: Three times a day (TID) | INTRAVENOUS | Status: AC
  Administered 2012-06-19 – 2012-06-20 (×2): 1 g via INTRAVENOUS
  Filled 2012-06-19 (×3): qty 50

## 2012-06-19 MED ORDER — MIDAZOLAM HCL 5 MG/5ML IJ SOLN
INTRAMUSCULAR | Status: DC | PRN
  Administered 2012-06-19: 2 mg via INTRAVENOUS

## 2012-06-19 MED ORDER — FENTANYL CITRATE 0.05 MG/ML IJ SOLN
INTRAMUSCULAR | Status: DC | PRN
  Administered 2012-06-19: 100 ug via INTRAVENOUS
  Administered 2012-06-19 (×2): 50 ug via INTRAVENOUS
  Administered 2012-06-19: 100 ug via INTRAVENOUS
  Administered 2012-06-19: 50 ug via INTRAVENOUS

## 2012-06-19 MED ORDER — ZOLPIDEM TARTRATE 5 MG PO TABS: 5.0000 mg | ORAL_TABLET | Freq: Every evening | ORAL | Status: DC | PRN

## 2012-06-19 MED ORDER — MIDAZOLAM HCL 2 MG/2ML IJ SOLN
1.0000 mg | INTRAMUSCULAR | Status: DC | PRN
  Administered 2012-06-19: 2 mg via INTRAVENOUS

## 2012-06-19 MED ORDER — ONDANSETRON HCL 4 MG/2ML IJ SOLN: 4.0000 mg | INTRAMUSCULAR | Status: DC | PRN

## 2012-06-19 MED ORDER — ARTIFICIAL TEARS OP OINT
TOPICAL_OINTMENT | OPHTHALMIC | Status: DC | PRN
  Administered 2012-06-19: 1 via OPHTHALMIC

## 2012-06-19 MED ORDER — SUCCINYLCHOLINE CHLORIDE 20 MG/ML IJ SOLN
INTRAMUSCULAR | Status: DC | PRN
  Administered 2012-06-19: 160 mg via INTRAVENOUS

## 2012-06-19 MED ORDER — ONDANSETRON HCL 4 MG/2ML IJ SOLN
INTRAMUSCULAR | Status: DC | PRN
  Administered 2012-06-19: 4 mg via INTRAVENOUS

## 2012-06-19 MED ORDER — SODIUM CHLORIDE 0.9 % IV SOLN: 250.0000 mL | INTRAVENOUS | Status: DC

## 2012-06-19 MED ORDER — MENTHOL 3 MG MT LOZG: 1.0000 | LOZENGE | OROMUCOSAL | Status: DC | PRN

## 2012-06-19 MED ORDER — DOCUSATE SODIUM 100 MG PO CAPS
100.0000 mg | ORAL_CAPSULE | Freq: Two times a day (BID) | ORAL | Status: DC
  Administered 2012-06-19 – 2012-06-20 (×2): 100 mg via ORAL
  Filled 2012-06-19 (×2): qty 1

## 2012-06-19 MED ORDER — ROCURONIUM BROMIDE 100 MG/10ML IV SOLN
INTRAVENOUS | Status: DC | PRN
  Administered 2012-06-19: 10 mg via INTRAVENOUS
  Administered 2012-06-19: 50 mg via INTRAVENOUS
  Administered 2012-06-19 (×2): 10 mg via INTRAVENOUS

## 2012-06-19 MED ORDER — SODIUM CHLORIDE 0.9 % IJ SOLN: 3.0000 mL | Freq: Two times a day (BID) | INTRAMUSCULAR | Status: DC

## 2012-06-19 MED ORDER — VALSARTAN-HYDROCHLOROTHIAZIDE 320-25 MG PO TABS: 1.0000 | ORAL_TABLET | Freq: Every morning | ORAL | Status: DC

## 2012-06-19 MED ORDER — LIDOCAINE HCL (CARDIAC) 20 MG/ML IV SOLN
INTRAVENOUS | Status: DC | PRN
  Administered 2012-06-19: 100 mg via INTRAVENOUS

## 2012-06-19 MED ORDER — PHENOL 1.4 % MT LIQD: 1.0000 | OROMUCOSAL | Status: DC | PRN

## 2012-06-19 MED ORDER — LACTATED RINGERS IV SOLN
INTRAVENOUS | Status: DC
  Administered 2012-06-19 (×3): via INTRAVENOUS

## 2012-06-19 SURGICAL SUPPLY — 59 items
BUR EGG ELITE 4.0 (BURR) IMPLANT
BUR MATCHSTICK NEURO 3.0 LAGG (BURR) ×2 IMPLANT
CANISTER SUCTION 2500CC (MISCELLANEOUS) ×2 IMPLANT
CLOTH BEACON ORANGE TIMEOUT ST (SAFETY) ×2 IMPLANT
CLSR STERI-STRIP ANTIMIC 1/2X4 (GAUZE/BANDAGES/DRESSINGS) ×2 IMPLANT
CORDS BIPOLAR (ELECTRODE) ×2 IMPLANT
COVER SURGICAL LIGHT HANDLE (MISCELLANEOUS) ×2 IMPLANT
DERMABOND ADVANCED (GAUZE/BANDAGES/DRESSINGS)
DERMABOND ADVANCED .7 DNX12 (GAUZE/BANDAGES/DRESSINGS) IMPLANT
DRAIN CHANNEL 15F RND FF W/TCR (WOUND CARE) ×2 IMPLANT
DRAPE POUCH INSTRU U-SHP 10X18 (DRAPES) ×2 IMPLANT
DRAPE SURG 17X23 STRL (DRAPES) ×2 IMPLANT
DRAPE U-SHAPE 47X51 STRL (DRAPES) ×2 IMPLANT
DRSG MEPILEX BORDER 4X4 (GAUZE/BANDAGES/DRESSINGS) ×2 IMPLANT
DRSG MEPILEX BORDER 4X8 (GAUZE/BANDAGES/DRESSINGS) ×2 IMPLANT
DURAPREP 26ML APPLICATOR (WOUND CARE) ×2 IMPLANT
ELECT BLADE 4.0 EZ CLEAN MEGAD (MISCELLANEOUS) ×2
ELECT CAUTERY BLADE 6.4 (BLADE) ×2 IMPLANT
ELECT REM PT RETURN 9FT ADLT (ELECTROSURGICAL) ×2
ELECTRODE BLDE 4.0 EZ CLN MEGD (MISCELLANEOUS) ×1 IMPLANT
ELECTRODE REM PT RTRN 9FT ADLT (ELECTROSURGICAL) ×1 IMPLANT
EVACUATOR 1/8 PVC DRAIN (DRAIN) IMPLANT
EVACUATOR SILICONE 100CC (DRAIN) ×2 IMPLANT
GLOVE BIOGEL PI IND STRL 6.5 (GLOVE) IMPLANT
GLOVE BIOGEL PI IND STRL 8.5 (GLOVE) ×1 IMPLANT
GLOVE BIOGEL PI INDICATOR 6.5 (GLOVE)
GLOVE BIOGEL PI INDICATOR 8.5 (GLOVE) ×1
GLOVE ECLIPSE 6.0 STRL STRAW (GLOVE) IMPLANT
GLOVE ECLIPSE 8.5 STRL (GLOVE) ×2 IMPLANT
GOWN PREVENTION PLUS XXLARGE (GOWN DISPOSABLE) ×2 IMPLANT
GOWN STRL NON-REIN LRG LVL3 (GOWN DISPOSABLE) ×4 IMPLANT
KIT BASIN OR (CUSTOM PROCEDURE TRAY) ×2 IMPLANT
KIT ROOM TURNOVER OR (KITS) ×2 IMPLANT
NEEDLE 22X1 1/2 (OR ONLY) (NEEDLE) ×2 IMPLANT
NEEDLE SPNL 18GX3.5 QUINCKE PK (NEEDLE) ×4 IMPLANT
NS IRRIG 1000ML POUR BTL (IV SOLUTION) ×2 IMPLANT
PACK LAMINECTOMY ORTHO (CUSTOM PROCEDURE TRAY) ×2 IMPLANT
PACK UNIVERSAL I (CUSTOM PROCEDURE TRAY) ×2 IMPLANT
PAD ARMBOARD 7.5X6 YLW CONV (MISCELLANEOUS) ×4 IMPLANT
PATTIES SURGICAL .5 X.5 (GAUZE/BANDAGES/DRESSINGS) IMPLANT
PATTIES SURGICAL .5 X1 (DISPOSABLE) ×2 IMPLANT
SPONGE GAUZE 4X4 12PLY (GAUZE/BANDAGES/DRESSINGS) ×2 IMPLANT
SPONGE SURGIFOAM ABS GEL 100 (HEMOSTASIS) ×2 IMPLANT
STRIP CLOSURE SKIN 1/2X4 (GAUZE/BANDAGES/DRESSINGS) ×2 IMPLANT
SURGIFLO TRUKIT (HEMOSTASIS) IMPLANT
SUT ETHILON 2 0 FS 18 (SUTURE) ×2 IMPLANT
SUT MNCRL AB 3-0 PS2 18 (SUTURE) ×2 IMPLANT
SUT VIC AB 0 CT1 27 (SUTURE) ×1
SUT VIC AB 0 CT1 27XBRD ANBCTR (SUTURE) ×1 IMPLANT
SUT VIC AB 1 CTX 36 (SUTURE) ×2
SUT VIC AB 1 CTX36XBRD ANBCTR (SUTURE) ×2 IMPLANT
SUT VIC AB 2-0 CT1 18 (SUTURE) ×2 IMPLANT
SYR BULB IRRIGATION 50ML (SYRINGE) ×2 IMPLANT
SYR CONTROL 10ML LL (SYRINGE) ×2 IMPLANT
TAPE CLOTH SURG 6X10 WHT LF (GAUZE/BANDAGES/DRESSINGS) ×2 IMPLANT
TOWEL OR 17X24 6PK STRL BLUE (TOWEL DISPOSABLE) ×2 IMPLANT
TOWEL OR 17X26 10 PK STRL BLUE (TOWEL DISPOSABLE) ×2 IMPLANT
WATER STERILE IRR 1000ML POUR (IV SOLUTION) IMPLANT
YANKAUER SUCT BULB TIP NO VENT (SUCTIONS) ×2 IMPLANT

## 2012-06-19 NOTE — Preoperative (Signed)
Beta Blockers   Reason not to administer Beta Blockers:Not Applicable 

## 2012-06-19 NOTE — Brief Op Note (Signed)
06/19/2012  7:30 PM  PATIENT:  Tommy [name]  68 y.o. male  PRE-OPERATIVE DIAGNOSIS:  L2-3 LEFT DISC HERNEATION  POST-OPERATIVE DIAGNOSIS:  L2-3 LEFT DISC HERNEATION  PROCEDURE:  Procedure(s) (LRB) with comments: LUMBAR LAMINECTOMY/DECOMPRESSION MICRODISCECTOMY 1 LEVEL (Left) - L2-3 LEFT MICRODISCECTOMY  SURGEON:  Surgeon(s) and Role:    * Venita Lick, MD - Primary  PHYSICIAN ASSISTANT:   ASSISTANTS: none   ANESTHESIA:   general  EBL:  Total I/O In: 1000 [I.V.:1000] Out: 150 [Blood:150]  BLOOD ADMINISTERED:none  DRAINS: Penrose drain in the back   LOCAL MEDICATIONS USED:  MARCAINE     SPECIMEN:  No Specimen  DISPOSITION OF SPECIMEN:  N/A  COUNTS:  YES  TOURNIQUET:  * No tourniquets in log *  DICTATION: .Other Dictation: Dictation Number [id number]  PLAN OF CARE: Admit to inpatient   PATIENT DISPOSITION:  PACU - hemodynamically stable.

## 2012-06-19 NOTE — Anesthesia Procedure Notes (Signed)
Procedure Name: Intubation Date/Time: 06/19/2012 5:03 PM Performed by: Jefm Miles E Pre-anesthesia Checklist: Patient identified, Timeout performed, Emergency Drugs available, Suction available and Patient being monitored Patient Re-evaluated:Patient Re-evaluated prior to inductionOxygen Delivery Method: Circle system utilized Preoxygenation: Pre-oxygenation with 100% oxygen Intubation Type: IV induction and Rapid sequence Laryngoscope Size: Mac Tube type: Oral Tube size: 7.5 mm Airway Equipment and Method: Video-laryngoscopy Placement Confirmation: ETT inserted through vocal cords under direct vision,  breath sounds checked- equal and bilateral and positive ETCO2 Secured at: 23 cm Tube secured with: Tape Dental Injury: Teeth and Oropharynx as per pre-operative assessment  Difficulty Due To: Difficulty was anticipated and Difficult Airway- due to large tongue Future Recommendations: Recommend- induction with short-acting agent, and alternative techniques readily available

## 2012-06-19 NOTE — Anesthesia Preprocedure Evaluation (Addendum)
Anesthesia Evaluation  Patient identified by MRN, date of birth, ID band Patient awake    Reviewed: Allergy & Precautions, H&P , NPO status , Patient's Chart, lab work & pertinent test results  Airway Mallampati: II TM Distance: >3 FB Neck ROM: Full    Dental  (+) Teeth Intact and Dental Advisory Given   Pulmonary  breath sounds clear to auscultation        Cardiovascular hypertension, Rhythm:Regular Rate:Normal     Neuro/Psych  Neuromuscular disease    GI/Hepatic hiatal hernia, GERD-  ,  Endo/Other  Morbid obesity  Renal/GU      Musculoskeletal   Abdominal (+) + obese,   Peds  Hematology   Anesthesia Other Findings   Reproductive/Obstetrics                          Anesthesia Physical Anesthesia Plan  ASA: III  Anesthesia Plan: General   Post-op Pain Management:    Induction: Intravenous  Airway Management Planned: Oral ETT and Video Laryngoscope Planned  Additional Equipment:   Intra-op Plan:   Post-operative Plan: Extubation in OR  Informed Consent: I have reviewed the patients History and Physical, chart, labs and discussed the procedure including the risks, benefits and alternatives for the proposed anesthesia with the patient or authorized representative who has indicated his/her understanding and acceptance.     Plan Discussed with: CRNA and Surgeon  Anesthesia Plan Comments:         Anesthesia Quick Evaluation

## 2012-06-19 NOTE — Anesthesia Postprocedure Evaluation (Signed)
  Anesthesia Post-op Note  Patient: Tommy [name]  Procedure(s) Performed: Procedure(s) (LRB) with comments: LUMBAR LAMINECTOMY/DECOMPRESSION MICRODISCECTOMY 1 LEVEL (Left) - L2-3 LEFT MICRODISCECTOMY  Patient Location: PACU  Anesthesia Type:General  Level of Consciousness: awake and alert   Airway and Oxygen Therapy: Patient Spontanous Breathing and Patient connected to face mask oxygen  Post-op Pain: mild  Post-op Assessment: Post-op Vital signs reviewed  Post-op Vital Signs: Reviewed  Complications: No apparent anesthesia complications

## 2012-06-19 NOTE — Transfer of Care (Signed)
Immediate Anesthesia Transfer of Care Note  Patient: Tommy [name]  Procedure(s) Performed: Procedure(s) (LRB) with comments: LUMBAR LAMINECTOMY/DECOMPRESSION MICRODISCECTOMY 1 LEVEL (Left) - L2-3 LEFT MICRODISCECTOMY  Patient Location: PACU  Anesthesia Type:General  Level of Consciousness: awake and patient cooperative  Airway & Oxygen Therapy: Patient Spontanous Breathing and Patient connected to face mask oxygen  Post-op Assessment: Report given to PACU RN, Post -op Vital signs reviewed and stable and Patient moving all extremities X 4  Post vital signs: Reviewed and stable  Complications: No apparent anesthesia complications

## 2012-06-19 NOTE — Progress Notes (Signed)
Pt remains dystonic and confused after Versed.  Pt. Uncooperative and will not keep on O2. Combative at times.  Family called and will come from waiting room.  Discussed with Dr. Ivin Booty. Time needed for anesthesia effects to wear off. No new orders.

## 2012-06-19 NOTE — H&P (Signed)
No change in clinical exam H+P reviewed  

## 2012-06-19 NOTE — Op Note (Signed)
NAMETRAVEZ, STANCIL NO.:  [id number]  MEDICAL RECORD NO.:  [id number]  LOCATION:  5N01C                        FACILITY:  [facility]  PHYSICIAN:  Alvy Beal, MD    DATE OF BIRTH:  [birth year]  DATE OF PROCEDURE:  06/19/2012 DATE OF DISCHARGE:                              OPERATIVE REPORT   PREOPERATIVE DIAGNOSIS:  L2-3 left-sided lumbar disk herniation.  POSTOPERATIVE DIAGNOSIS:  L2-3 left-sided lumbar disk herniation.  OPERATIVE PROCEDURE:  Lumbar laminectomy for diskectomy.  COMPLICATIONS:  None.  CONDITION:  Stable.  HISTORY:  This is a very pleasant gentleman who presented to my office with an ongoing distending low back pain with acute onset of severe left thigh pain.  Clinical and radiographic analysis confirmed with diagnosis of a lumbar disk herniation at L2-3.  Attempts at conservative management have failed to alleviate his symptoms.  As a result, the patient was taken to the operating room for a lumbar diskectomy.  All appropriate risks, benefits, and alternatives were discussed with the patient and consent was obtained.  OPERATIVE NOTE:  The patient was brought to the operating room, placed supine on the operating table.  After successful induction of general anesthesia and endotracheal intubation, TEDs, SCDs were applied.  He was turned prone onto the [place] frame.  All bony prominences were well padded.  The back was prepped and draped in a standard fashion.  Time- out was done, confirming patient, procedure, and all other pertinent important data.  At this point in time, a fluoro was brought into the field sterilely and we identified the L2-L3 disk space.  I infiltrated the skin incision site, and I made a midline skin incision.  Sharp dissection was carried out down to the deep fascia.  Deep fascia was sharply incised, and I stripped the paraspinal muscles to expose the L2 and L3 spinous process and lamina.  I then placed a  self-retaining retractor device (Thompson retractor and extended to the appropriate width).  At this point, I could clearly visualize the left side of the posterior aspect.  I placed a Penfield 4 underneath the L2 lamina and took an intraoperative x-ray and confirm that I was at the appropriate level.  Once this was done, I then used a 2 and 3 mm Kerrison to perform a laminotomy of L2.  I then released the ligamentum flavum from the leading edge of the L3 lamina and removed the ligamentum flavum and exposed the underlying thecal sac.  I then protected the thecal sac with neural patty, and then resected some of the superior aspect of the L3 lamina as well.  At this point, I had a good indwelling to the thecal sac.  I then dissected laterally sweeping the nerve root and thecal sac medially.  I then dissected in the axilla between the thecal sac in the L2 nerve root and used a nerve hook to palpate.  I then delivered the fragmented disk material and removed it with a micro pituitary rongeur. At this point, I had a significant disk fragment removed consistent with what was seen on the preoperative MRI.  I then took my [facility] and palpated inferiorly to the lateral recess superiorly and circumferentially at the  level of the disk space.  There was no other free fragments of disk and I had excellent decompression of the thecal sac and neuro elements.  I then irrigated copiously with normal saline, obtained hemostasis using bipolar electrocautery and Floseal. Once this was done, I then placed a thrombin-soaked Gelfoam patty over the exposed thecal sac.  Because of his morbid obesity, I was concerned about postoperative bleeding from the soft tissue from the musculature so I placed a drain.  This was brought out through a stab incision through the skin.  I then closed the deep fascia with a #1 Vicryl suture over the drain superficial 2-0 Vicryl sutures, and 3-0 Monocryl.  Steri-Strips and  dry dressing were applied.  The patient was extubated and transferred to the PACU without incident.  At the end of the case, all needle and sponge counts were correct.  There was no adverse intraoperative events.     Alvy Beal, MD     DDB/MEDQ  D:  06/19/2012  T:  06/19/2012  Job:  [id number]

## 2012-06-20 ENCOUNTER — Encounter (HOSPITAL_COMMUNITY): Payer: Self-pay | Admitting: Orthopedic Surgery

## 2012-06-20 MED ORDER — POLYETHYLENE GLYCOL 3350 17 GM/SCOOP PO POWD: 17.0000 g | Freq: Every day | ORAL | Status: DC

## 2012-06-20 MED ORDER — HYDROCODONE-ACETAMINOPHEN 10-325 MG PO TABS: 1.0000 | ORAL_TABLET | Freq: Four times a day (QID) | ORAL | Status: DC | PRN

## 2012-06-20 MED ORDER — ONDANSETRON HCL 4 MG PO TABS: 4.0000 mg | ORAL_TABLET | Freq: Three times a day (TID) | ORAL | Status: DC | PRN

## 2012-06-20 MED ORDER — DOCUSATE SODIUM 100 MG PO CAPS: 100.0000 mg | ORAL_CAPSULE | Freq: Three times a day (TID) | ORAL | Status: DC | PRN

## 2012-06-20 MED ORDER — CEFAZOLIN SODIUM 1-5 GM-% IV SOLN
1.0000 g | Freq: Three times a day (TID) | INTRAVENOUS | Status: DC
  Administered 2012-06-20: 1 g via INTRAVENOUS
  Filled 2012-06-20 (×2): qty 50

## 2012-06-20 MED ORDER — OXYCODONE-ACETAMINOPHEN 10-325 MG PO TABS: 1.0000 | ORAL_TABLET | ORAL | Status: DC | PRN

## 2012-06-20 MED ORDER — METHOCARBAMOL 500 MG PO TABS: 500.0000 mg | ORAL_TABLET | Freq: Three times a day (TID) | ORAL | Status: DC | PRN

## 2012-06-20 NOTE — Evaluation (Signed)
Physical Therapy Evaluation Patient Details Name: Tommy [name] MRN: [id number] DOB: [birth year] Today's Date: 06/20/2012 Time: 4098-1191 PT Time Calculation (min): 26 min  PT Assessment / Plan / Recommendation Clinical Impression  67 y.o. male POD#1 for L2-L3 lumbar laminectomy with decompression microdiscectomy. Pt doing very well with mobility. He ambulated 200' with RW independently. Instructed pt in back precautions. Pt has needed DME, no further acute PT needs. OK to DC home from PT standpoint.    PT Assessment  Patent does not need any further PT services    Follow Up Recommendations  No PT follow up    Does the patient have the potential to tolerate intense rehabilitation      Barriers to Discharge        Equipment Recommendations  None recommended by PT    Recommendations for Other Services OT consult   Frequency      Precautions / Restrictions Precautions Precautions: Back Required Braces or Orthoses: Spinal Brace Spinal Brace: Lumbar corset Restrictions Weight Bearing Restrictions: No   Pertinent Vitals/Pain **pt reports "just pulling" at incision site, no pain SaO2 89-91% on RA at rest and with walking*      Mobility  Bed Mobility Bed Mobility: Rolling Right;Right Sidelying to Sit Rolling Right: 7: Independent Right Sidelying to Sit: 7: Independent;HOB flat Transfers Transfers: Sit to Stand;Stand to Sit Sit to Stand: 6: Modified independent (Device/Increase time);With upper extremity assist;From bed Stand to Sit: 6: Modified independent (Device/Increase time);With upper extremity assist;To chair/3-in-1 Ambulation/Gait Ambulation/Gait Assistance: 6: Modified independent (Device/Increase time) Ambulation Distance (Feet): 200 Feet Assistive device: Rolling walker Gait Pattern: Within Functional Limits General Gait Details: no LOB    Shoulder Instructions     Exercises     PT Diagnosis:    PT Problem List:   PT Treatment Interventions:      PT Goals    Visit Information  Last PT Received On: 06/20/12 Assistance Needed: +1    Subjective Data  Subjective: I'm really ready to get out of bed. I love to walk.  Patient Stated Goal: to return to walking several miles daily   Prior Functioning  Home Living Lives With: Spouse Available Help at Discharge: Family Type of Home: House Home Access: Level entry Home Layout: One level Home Adaptive Equipment: Bedside commode/3-in-1;Walker - rolling;Raised toilet seat with rails Prior Function Level of Independence: Independent Vocation: Retired Musician: No difficulties    Cognition  Overall Cognitive Status: Appears within functional limits for tasks assessed/performed Arousal/Alertness: Awake/alert Orientation Level: Appears intact for tasks assessed Behavior During Session: [facility] for tasks performed    Extremity/Trunk Assessment Right Upper Extremity Assessment RUE ROM/Strength/Tone: [facility] for tasks assessed Left Upper Extremity Assessment LUE ROM/Strength/Tone: WFL for tasks assessed Right Lower Extremity Assessment RLE ROM/Strength/Tone: Within functional levels RLE Sensation: WFL - Light Touch RLE Coordination: WFL - gross/fine motor Left Lower Extremity Assessment LLE ROM/Strength/Tone: Within functional levels LLE Sensation: Deficits LLE Sensation Deficits: decreased to light touch L knee LLE Coordination: WFL - gross/fine motor Trunk Assessment Trunk Assessment: Normal   Balance Balance Balance Assessed: Yes Static Sitting Balance Static Sitting - Balance Support: No upper extremity supported;Feet supported Static Sitting - Comment/# of Minutes: 2  End of Session PT - End of Session Equipment Utilized During Treatment: Back brace Patient left: in chair;with call bell/phone within reach;with family/visitor present  GP     Tamala Ser 06/20/2012, 12:03 PM  [phone]

## 2012-06-20 NOTE — Progress Notes (Signed)
Utilization review completed. Amantha Sklar, RN, BSN. 

## 2012-06-20 NOTE — Discharge Summary (Signed)
Patient ID: Tommy [name] MRN: [id number] DOB/AGE: 67-Oct-1947 67 y.o.  Admit date: 06/19/2012 Discharge date: 06/20/2012  Admission Diagnoses:  Principal Problem:  *Lumbar disc herniation with radiculopathy   Discharge Diagnoses:  Principal Problem:  *Lumbar disc herniation with radiculopathy  status post Procedure(s): LUMBAR LAMINECTOMY/DECOMPRESSION MICRODISCECTOMY 1 LEVEL  Past Medical History  Diagnosis Date  . Hypertension     sees Dr. Timothy Lasso  . GERD (gastroesophageal reflux disease)   . H/O hiatal hernia   . Cancer     "melanoma removed from leg years ago"  . Anemia     "as teenager"  . Ulcer     stomach ulcer "as teenager"  . Chronic back pain     Surgeries: Procedure(s): LUMBAR LAMINECTOMY/DECOMPRESSION MICRODISCECTOMY 1 LEVEL on 06/19/2012   Consultants:  none Discharged Condition: Improved  Hospital Course: Tommy [name] is an 67 y.o. male who was admitted 06/19/2012 for operative treatment of Lumbar disc herniation with radiculopathy. Patient failed conservative treatments (please see the history and physical for the specifics) and had severe unremitting pain that affects sleep, daily activities and work/hobbies. After pre-op clearance, the patient was taken to the operating room on 06/19/2012 and underwent  Procedure(s): LUMBAR LAMINECTOMY/DECOMPRESSION MICRODISCECTOMY 1 LEVEL.    Patient was given perioperative antibiotics: Anti-infectives     Start     Dose/Rate Route Frequency Ordered Stop   06/20/12 0715   ceFAZolin (ANCEF) IVPB 1 g/50 mL premix        1 g 100 mL/hr over 30 Minutes Intravenous Every 8 hours 06/20/12 0712 06/20/12 2314   06/19/12 2245   ceFAZolin (ANCEF) IVPB 1 g/50 mL premix        1 g 100 mL/hr over 30 Minutes Intravenous Every 8 hours 06/19/12 2238 06/20/12 0700   06/19/12 1700   ceFAZolin (ANCEF) 3 g in dextrose 5 % 50 mL IVPB  Status:  Discontinued        3 g 160 mL/hr over 30 Minutes Intravenous To Surgery  06/19/12 1650 06/19/12 2233   06/18/12 1258   ceFAZolin (ANCEF) 3 g in dextrose 5 % 50 mL IVPB  Status:  Discontinued        3 g 160 mL/hr over 30 Minutes Intravenous 30 min pre-op 06/18/12 1258 06/19/12 1650           Patient was given sequential compression devices and early ambulation to prevent DVT.   Patient benefited maximally from hospital stay and there were no complications. At the time of discharge, the patient was urinating/moving their bowels without difficulty, tolerating a regular diet, pain is controlled with oral pain medications and they have been cleared by PT/OT.   Recent vital signs: Patient Vitals for the past 24 hrs:  BP Temp Temp src Pulse Resp SpO2  06/20/12 0716 120/76 mmHg 97.6 F (36.4 C) - 70  20  99 %  06/19/12 2253 112/68 mmHg 98.2 F (36.8 C) - 69  20  99 %  06/19/12 2215 108/70 mmHg 97.7 F (36.5 C) - 79  22  91 %  06/19/12 2200 109/65 mmHg - - 82  20  86 %  06/19/12 2145 115/68 mmHg - - 84  18  87 %  06/19/12 2130 115/66 mmHg - - 78  19  89 %  06/19/12 2115 98/65 mmHg - - 85  17  95 %  06/19/12 2100 98/55 mmHg - - 81  11  73 %  06/19/12 2045 130/112 mmHg - - 82  18  79 %  06/19/12 2030 - - - 97  18  94 %  06/19/12 2015 107/60 mmHg - - 89  16  100 %  06/19/12 2000 95/64 mmHg 98.1 F (36.7 C) - 83  13  94 %  06/19/12 1500 - - - - - 88 %  06/19/12 1318 123/87 mmHg 98 F (36.7 C) Oral 89  18  93 %     Recent laboratory studies:  Basename 06/17/12 1439  WBC 10.0  HGB 13.9  HCT 41.8  PLT 256  NA 141  K 3.9  CL 100  CO2 31  BUN 17  CREATININE 0.79  GLUCOSE 106*  INR --  CALCIUM 9.7     Discharge Medications:     Medication List     As of 06/20/2012  7:17 AM    ASK your doctor about these medications         amLODipine 10 MG tablet   Commonly known as: NORVASC   Take 10 mg by mouth every morning.      aspirin 325 MG EC tablet   Take 325 mg by mouth daily.      FIBER PO   Take 2 tablets by mouth every morning.       Glucosamine-Chondroitin 750-600 MG Tabs   Take 2 tablets by mouth every morning.      mometasone 50 MCG/ACT nasal spray   Commonly known as: NASONEX   Place 2 sprays into the nose daily.      montelukast 10 MG tablet   Commonly known as: SINGULAIR   Take 10 mg by mouth every morning.      oxycodone 5 MG capsule   Commonly known as: OXY-IR   Take 10 mg by mouth every 6 (six) hours as needed. As needed for pain.      valsartan-hydrochlorothiazide 320-25 MG per tablet   Commonly known as: DIOVAN-HCT   Take 1 tablet by mouth every morning.        Diagnostic Studies: Dg Chest 2 View  06/17/2012  *RADIOLOGY REPORT*  Clinical Data: Lumbar disc protrusion.  Preoperative respiratory exam.  CHEST - 2 VIEW  Comparison: 06/29/2010  Findings: Heart size and pulmonary vascularity are normal.  There is tortuosity and calcification of the thoracic aorta.  The lungs are clear.  Chronic slight elevation of the right hemidiaphragm, unchanged.  Chronic slight prominence of the superior mediastinum, most likely due to tortuous brachiocephalic vessels.  No acute osseous abnormality.  IMPRESSION: No acute disease in the chest.   Original Report Authenticated By: Francene Boyers, M.D.    Dg Lumbar Spine 2-3 Views  06/17/2012  *RADIOLOGY REPORT*  Clinical Data: Low back pain  LUMBAR SPINE - 2-3 VIEW  Comparison: MRI 05/26/2012.  Findings: Five lumbar type vertebral bodies are present.  There is advanced disc space narrowing at L4-L5.  No worrisome osseous lesions.  IMPRESSION: As above.   Original Report Authenticated By: Davonna Belling, M.D.    Mr Lumbar Spine W Wo Contrast  05/27/2012  *RADIOLOGY REPORT*  Clinical Data: Low back pain and left hip and leg pain.  MRI LUMBAR SPINE WITHOUT AND WITH CONTRAST  Technique:  Multiplanar and multiecho pulse sequences of the lumbar spine were obtained without and with intravenous contrast.  Contrast: 20mL MULTIHANCE GADOBENATE DIMEGLUMINE 529 MG/ML IV SOLN  Comparison: None.   Findings: Tip of the conus is at T12-L1 and appears normal.  Normal paraspinal soft tissues.  T11-12 and T12-L1:  No significant  abnormalities.  L1-2:  Small disc protrusion into the left lateral recess without impingement.  L2-3: 18 x 8 x 6 mm extruded disc fragment extending inferiorly behind the body of L3 in the left lateral recess severely compressing the left L3 nerve and the left ventral aspect of the thecal sac.  Small broad-based bulge of the remainder of the disc.  L3-4:  1.5 mm retrolisthesis.  Small disc bulge and osteophytes extend far laterally to the right immediately adjacent to the L3 and nerve after it has exited the neural foramen.  Slight narrowing of the spinal canal.  L4-5:  Marked disc space narrowing with prominent endplate osteophytes as well as hypertrophy of the facet joints and ligamenta flava create mild spinal stenosis and narrowing of both lateral recesses. Previous right laminotomy.  Minimal enhancing scar tissue around the thecal sac and nerve roots after contrast administration.  L5-S1:  Tiny broad-based disc bulge with no neural impingement. Moderate bilateral facet arthritis.  IMPRESSION: 1.  Prominent soft disc extrusion at L2-3 on the left extending inferiorly behind the body of L3 markedly compressing the left L3 nerve. 2.  Tiny of focal disc protrusion in the left lateral recess at L1- 2 without impingement. 3.  Mild narrowing of the spinal canal L3-4 and L4-5 without focal impingement.   Original Report Authenticated By: Francene Boyers, M.D.    Dg Lumbar Spine 1 View  06/19/2012  *RADIOLOGY REPORT*  Clinical Data: L2-3 decompression.  DG C-ARM 1-60 MIN,LUMBAR SPINE - 1 VIEW  Comparison: 06/17/2012.  Findings: Single intraoperative C-arm views submitted for review after surgery.  Metallic probe posterior to the L2-3 disc space.  IMPRESSION: Localization L2-3.   Original Report Authenticated By: Lacy Duverney, M.D.    Dg C-arm 1-60 Min  06/19/2012  *RADIOLOGY REPORT*   Clinical Data: L2-3 decompression.  DG C-ARM 1-60 MIN,LUMBAR SPINE - 1 VIEW  Comparison: 06/17/2012.  Findings: Single intraoperative C-arm views submitted for review after surgery.  Metallic probe posterior to the L2-3 disc space.  IMPRESSION: Localization L2-3.   Original Report Authenticated By: Lacy Duverney, M.D.           Follow-up Information    Follow up with Alvy Beal, MD. Call in 2 weeks. (As needed if symptoms worsen)    Contact information:   [address] [place] [address] [phone]          Discharge Plan:  discharge to home  Disposition:  Drain output low - cleared by PT Ok for d/c to home F/u 2 weeks   Signed: Venita Lick D for Dr. Venita Lick [facility] [phone] 06/20/2012, 7:17 AM

## 2012-06-20 NOTE — Progress Notes (Signed)
CARE MANAGEMENT NOTE 06/20/2012  Patient:  Tommy [name], Tommy [name]   Account Number:  [id number]  Date Initiated:  06/20/2012  Documentation initiated by:  Vance Peper  Subjective/Objective Assessment:   67 yr old male s/p decompression microdiscectomy.     Action/Plan:   No home health needs identified by Physical therapist.  No DME needs.   Anticipated DC Date:  06/20/2012   Anticipated DC Plan:  HOME/SELF CARE      DC Planning Services  CM consult      Choice offered to / List presented to:             Status of service:  Completed, signed off Medicare Important Message given?   (If response is "NO", the following Medicare IM given date fields will be blank) Date Medicare IM given:   Date Additional Medicare IM given:    Discharge Disposition:  HOME/SELF CARE  Per UR Regulation:    If discussed at Long Length of Stay Meetings, dates discussed:    Comments:

## 2012-06-20 NOTE — Progress Notes (Signed)
    Subjective: Procedure(s) (LRB): LUMBAR LAMINECTOMY/DECOMPRESSION MICRODISCECTOMY 1 LEVEL (Left) 1 Day Post-Op  Patient reports pain as 2 on 0-10 scale.  Reports decreased leg pain reports incisional back pain   Positive void Negative bowel movement Positive flatus Negative chest pain or shortness of breath  Objective: Vital signs in last 24 hours: Temp:  [97.7 F (36.5 C)-98.2 F (36.8 C)] 98.2 F (36.8 C) (01/30 2253) Pulse Rate:  [69-97] 69  (01/30 2253) Resp:  [11-22] 20  (01/30 2253) BP: (95-130)/(55-112) 112/68 mmHg (01/30 2253) SpO2:  [73 %-100 %] 99 % (01/30 2253)  Intake/Output from previous day: 01/30 0701 - 01/31 0700 In: 3040 [P.O.:240; I.V.:2800] Out: 450 [Urine:200; Drains:100; Blood:150]  Labs:  [facility] 06/17/12 1439  WBC 10.0  RBC 4.63  HCT 41.8  PLT 256    Basename 06/17/12 1439  NA 141  K 3.9  CL 100  CO2 31  BUN 17  CREATININE 0.79  GLUCOSE 106*  CALCIUM 9.7   No results found for this basename: LABPT:2,INR:2 in the last 72 hours  Physical Exam: Neurologically intact ABD soft Neurovascular intact Intact pulses distally Dorsiflexion/Plantar flexion intact Incision: dressing C/D/I and no drainage No cellulitis present Compartment soft  Assessment/Plan: Patient stable  xrays n/a Continue mobilization with physical therapy Continue care  Advance diet Up with therapy Plan for discharge tomorrow  Maybe today Continue ABX as drain will remain in.  If drainage remains low over AM shift then will remove.  If cleared by PT and drain output low will consider late afternoon d/c to home. Otherwise plan on d/c Saturday.  Venita Lick, MD [facility] [phone]

## 2012-07-05 ENCOUNTER — Other Ambulatory Visit: Payer: Self-pay

## 2012-10-24 ENCOUNTER — Other Ambulatory Visit: Payer: Self-pay | Admitting: Orthopedic Surgery

## 2012-10-24 DIAGNOSIS — Z9889 Other specified postprocedural states: Secondary | ICD-10-CM

## 2012-11-03 ENCOUNTER — Ambulatory Visit
Admission: RE | Admit: 2012-11-03 | Discharge: 2012-11-03 | Disposition: A | Discharge status: Home / Self Care | Payer: Medicare Other | Source: Ambulatory Visit | Attending: Orthopedic Surgery | Admitting: Orthopedic Surgery

## 2012-11-03 DIAGNOSIS — Z9889 Other specified postprocedural states: Secondary | ICD-10-CM

## 2012-11-03 MED ORDER — GADOBENATE DIMEGLUMINE 529 MG/ML IV SOLN
20.0000 mL | Freq: Once | INTRAVENOUS | Status: AC | PRN
  Administered 2012-11-03: 20 mL via INTRAVENOUS

## 2012-12-24 ENCOUNTER — Other Ambulatory Visit: Payer: Self-pay

## 2013-03-26 ENCOUNTER — Other Ambulatory Visit: Payer: Self-pay

## 2013-12-21 ENCOUNTER — Encounter: Payer: Self-pay | Admitting: Neurology

## 2013-12-21 ENCOUNTER — Ambulatory Visit (INDEPENDENT_AMBULATORY_CARE_PROVIDER_SITE_OTHER): Payer: Medicare Other | Admitting: Neurology

## 2013-12-21 VITALS — BP 139/83 | HR 65 | Resp 16 | Ht 72.5 in | Wt 362.0 lb

## 2013-12-21 DIAGNOSIS — R0609 Other forms of dyspnea: Secondary | ICD-10-CM

## 2013-12-21 DIAGNOSIS — R0683 Snoring: Secondary | ICD-10-CM

## 2013-12-21 DIAGNOSIS — J45909 Unspecified asthma, uncomplicated: Secondary | ICD-10-CM | POA: Insufficient documentation

## 2013-12-21 DIAGNOSIS — R0602 Shortness of breath: Secondary | ICD-10-CM

## 2013-12-21 DIAGNOSIS — R0989 Other specified symptoms and signs involving the circulatory and respiratory systems: Secondary | ICD-10-CM

## 2013-12-21 DIAGNOSIS — R351 Nocturia: Secondary | ICD-10-CM | POA: Insufficient documentation

## 2013-12-21 HISTORY — DX: Morbid (severe) obesity due to excess calories: E66.01

## 2013-12-21 HISTORY — DX: Snoring: R06.83

## 2013-12-21 HISTORY — DX: Nocturia: R35.1

## 2013-12-21 NOTE — Patient Instructions (Signed)
Sleep Apnea  Sleep apnea is a sleep disorder characterized by abnormal pauses in breathing while you sleep. When your breathing pauses, the level of oxygen in your blood decreases. This causes you to move out of deep sleep and into light sleep. As a result, your quality of sleep is poor, and the system that carries your blood throughout your body (cardiovascular system) experiences stress. If sleep apnea remains untreated, the following conditions can develop:  High blood pressure (hypertension).  Coronary artery disease.  Inability to achieve or maintain an erection (impotence).  Impairment of your thought process (cognitive dysfunction). There are three types of sleep apnea: 1. Obstructive sleep apnea--Pauses in breathing during sleep because of a blocked airway. 2. Central sleep apnea--Pauses in breathing during sleep because the area of the brain that controls your breathing does not send the correct signals to the muscles that control breathing. 3. Mixed sleep apnea--A combination of both obstructive and central sleep apnea. RISK FACTORS The following risk factors can increase your risk of developing sleep apnea:  Being overweight.  Smoking.  Having narrow passages in your nose and throat.  Being of older age.  Being male.  Alcohol use.  Sedative and tranquilizer use.  Ethnicity. Among individuals younger than 35 years, African Americans are at increased risk of sleep apnea. SYMPTOMS   Difficulty staying asleep.  Daytime sleepiness and fatigue.  Loss of energy.  Irritability.  Loud, heavy snoring.  Morning headaches.  Trouble concentrating.  Forgetfulness.  Decreased interest in sex. DIAGNOSIS  In order to diagnose sleep apnea, your caregiver will perform a physical examination. Your caregiver may suggest that you take a home sleep test. Your caregiver may also recommend that you spend the night in a sleep lab. In the sleep lab, several monitors record  information about your heart, lungs, and brain while you sleep. Your leg and arm movements and blood oxygen level are also recorded. TREATMENT The following actions may help to resolve mild sleep apnea:  Sleeping on your side.   Using a decongestant if you have nasal congestion.   Avoiding the use of depressants, including alcohol, sedatives, and narcotics.   Losing weight and modifying your diet if you are overweight. There also are devices and treatments to help open your airway:  Oral appliances. These are custom-made mouthpieces that shift your lower jaw forward and slightly open your bite. This opens your airway.  Devices that create positive airway pressure. This positive pressure "splints" your airway open to help you breathe better during sleep. The following devices create positive airway pressure:  Continuous positive airway pressure (CPAP) device. The CPAP device creates a continuous level of air pressure with an air pump. The air is delivered to your airway through a mask while you sleep. This continuous pressure keeps your airway open.  Nasal expiratory positive airway pressure (EPAP) device. The EPAP device creates positive air pressure as you exhale. The device consists of single-use valves, which are inserted into each nostril and held in place by adhesive. The valves create very little resistance when you inhale but create much more resistance when you exhale. That increased resistance creates the positive airway pressure. This positive pressure while you exhale keeps your airway open, making it easier to breath when you inhale again.  Bilevel positive airway pressure (BPAP) device. The BPAP device is used mainly in patients with central sleep apnea. This device is similar to the CPAP device because it also uses an air pump to deliver continuous air pressure   through a mask. However, with the BPAP machine, the pressure is set at two different levels. The pressure when you  exhale is lower than the pressure when you inhale.  Surgery. Typically, surgery is only done if you cannot comply with less invasive treatments or if the less invasive treatments do not improve your condition. Surgery involves removing excess tissue in your airway to create a wider passage way. Document Released: 04/27/2002 Document Revised: 09/01/2012 Document Reviewed: 09/13/2011 ExitCare Patient Information 2015 [employer]. This information is not intended to replace advice given to you by your health care provider. Make sure you discuss any questions you have with your health care provider.  

## 2013-12-21 NOTE — Progress Notes (Signed)
[facility] SLEEP MEDICINE CONSULT  Provider:  Larey Seat, M D  Referring Provider: Precious Reel, MD Primary Care Physician:  Precious Reel, MD    HPI:  Tommy [name] is a 68 y.o.caucasian , right handed  male , who is seen here as a referral from Dr. Virgina Jock for sleep evaluation.   The patient  Is a retired Designer, jewellery, Music therapist. He has sleep problems of nocturia, snoring and GERD in sleep, EDS . He also has a history of osteoarthritis allergic rhinitis, GERD, hypertension, fatty liver disease. He has a family history of heart disease or hypertension.   Mr. Fischel reports that she had back surgery ( laminectomy in  level L4-L5  ) and had reported to his orthopedists that he was excessively daytime sleepy and had trouble staying awake when driving longer distances. Dr. Rolena Infante asked him to make his primary care physician aware of this problem.  Prior to his back surgery in 2014 the patient was used to sleep in a recliner mainly because he could not find a comfortable position in bed. Since his back surgery this has improved tremendously and he is now sleeping in a regular bed. He usually goes to bed around 11:30 PM until fall asleep within 10 minutes. He'll stay asleep for 3-4 hours until his first bathroom break. He sleeps on the side, but his shoulder hurts , and he needs a pillow beneath the knee. He wakes up , rarely finding himself on the back.  He likes prone sleep, but had GERD in that position. He is a light sleeper. He shares a bathroom with his wife, or is concerned about a hearing and snoring and to hearing and stopping to breathe at night. The patient wakes up spontaneously with alarm at about 6:30 AM and feels usually that his sleep duration is  6 hours or more. He is not usually refreshed and restored in the morning but she also cannot get more sleep even if he would like to. He quit drinking sodas. He has one cup of coffee in AM , diet green tea,  6-8 a day.   He states that his aches and pains are over his if he sleeps longer.  He has aches and pain when exercising, gained weight even after back surgery, he gained 60 pounds before surgery in only 3 years. He is frequently short of breath when just speaking, not exercising.    He has a sister with heart disease, a brother with obstructive sleep apnea ,  mother but was diagnosed as obstructive sleep apnea and his father is deceased with congestive heart failure.   Review of Systems: Out of a complete 14 system review, the patient complains of only the following symptoms, and all other reviewed systems are negative. He endorsed today an Epworth sleepiness score of 16 points, which is highly elevated and her fatigue severity of 47 points which is equally elevated. His geriatric depression scale was endorsed at 2 points.  This is normal.   History   Social History  . Marital Status: Married    Spouse Name: Tommy [name]    Number of Children: 2  . Years of Education: Masters   Occupational History  .     Social History Main Topics  . Smoking status: Never Smoker   . Smokeless tobacco: Never Used  . Alcohol Use: No  . Drug Use: No  . Sexual Activity: Not on file   Other Topics Concern  . Not  on file   Social History Narrative   Patient is married Tommy [name]) and lives at home with his wife.   Patient has two adult children.   Patient is retired.   Patient has a Master's degree.   Patient is right-handed   Patient does not drink any caffeine.    Family History  Problem Relation Age of Onset  . Breast cancer Sister   . Heart attack Sister   . Dementia Mother   . Thyroid disease Sister     Past Medical History  Diagnosis Date  . Hypertension     sees Dr. Virgina Jock  . GERD (gastroesophageal reflux disease)   . H/O hiatal hernia   . Cancer     "melanoma removed from leg years ago"  . Anemia     "as teenager"  . Ulcer     stomach ulcer "as teenager"  . Chronic  back pain     Past Surgical History  Procedure Laterality Date  . Cardiovascular stress test      "done approx 10 years ago, Dr. Virgina Jock"  . Lipoma removed      from leg  . Back surgery      "done approx. 24 years ago"  . Tonsillectomy    . Colonoscopy      hx of "with removal of polyps"  . Lumbar laminectomy/decompression microdiscectomy  06/19/2012    Procedure: LUMBAR LAMINECTOMY/DECOMPRESSION MICRODISCECTOMY 1 LEVEL;  Surgeon: Melina Schools, MD;  Location: [facility];  Service: Orthopedics;  Laterality: Left;  L2-3 LEFT MICRODISCECTOMY    Current Outpatient Prescriptions  Medication Sig Dispense Refill  . amLODipine (NORVASC) 10 MG tablet Take 10 mg by mouth every morning.      Marland Kitchen FIBER PO Take 2 tablets by mouth every morning.      . Glucosamine-Chondroitin 750-600 MG TABS Take 2 tablets by mouth every morning.      Marland Kitchen HYDROcodone-acetaminophen (NORCO) 10-325 MG per tablet Take 1 tablet by mouth every 6 (six) hours as needed for pain.  60 tablet  0  . ipratropium (ATROVENT) 0.03 % nasal spray As needed      . mometasone (NASONEX) 50 MCG/ACT nasal spray Place 2 sprays into the nose daily.      . montelukast (SINGULAIR) 10 MG tablet Take 10 mg by mouth every morning.      . naproxen (NAPROSYN) 500 MG tablet As needed      . oxyCODONE-acetaminophen (PERCOCET) 10-325 MG per tablet Take 1 tablet by mouth every 4 (four) hours as needed for pain.  60 tablet  0  . polyethylene glycol powder (GLYCOLAX) powder Take 17 g by mouth daily.  255 g  1  . valsartan-hydrochlorothiazide (DIOVAN-HCT) 320-25 MG per tablet Take 1 tablet by mouth every morning.       No current facility-administered medications for this visit.    Allergies as of 12/21/2013 - Review Complete 12/21/2013  Allergen Reaction Noted  . Micardis [telmisartan] Hives 06/19/2012    Vitals: BP 139/83  Pulse 65  Resp 16  Ht 6' 0.5" (1.842 m)  Wt 362 lb (164.202 kg)  BMI 48.39 kg/m2 Last Weight:  Wt Readings from Last 1  Encounters:  12/21/13 362 lb (164.202 kg)   Last Height:   Ht Readings from Last 1 Encounters:  12/21/13 6' 0.5" (1.842 m)    Physical exam:  General: The patient is awake, alert and appears not in acute distress. The patient is well groomed. Head: Normocephalic, atraumatic. Neck is supple. Mallampati  3 , neck circumference: 20 , full facial hair . Cardiovascular:  Regular rate and rhythm , without  murmurs or carotid bruit, and without distended neck veins. Respiratory: Lungs are clear to auscultation. Tachypnea. Skin:  Without evidence of edema, or rash Trunk: BMI is  elevated and patient  has normal posture.  Neurologic exam : The patient is awake and alert, oriented to place and time.  Memory subjective described as intact. There is a normal attention span & concentration ability.  Speech is fluent without dysarthria, dysphonia or aphasia. Mood and affect are appropriate.  Cranial nerves: Pupils are equal and briskly reactive to light. Funduscopic exam without  evidence of pallor or edema.  Extraocular movements  in vertical and horizontal planes intact and without nystagmus. Visual fields by finger perimetry are intact. Hearing to finger rub intact.  Facial sensation intact to fine touch. Facial motor strength is symmetric and tongue and uvula move midline.  Motor exam: Normal tone ,muscle bulk and symmetric normal strength in all extremities.  Sensory:  Fine touch, pinprick and vibration were tested in all extremities.  Proprioception is normal.  Coordination: Rapid alternating movements in the fingers/hands is tested and normal.  Finger-to-nose maneuver tested and normal without evidence of ataxia, dysmetria or tremor.  Gait and station: Patient walks without assistive device . Deep tendon reflexes: in the  upper and lower extremities are symmetric and intact. Babinski maneuver response is  downgoing.   Assessment:  After physical and neurologic examination, review of  laboratory studies, imaging, neurophysiology testing and pre-existing records, assessment is :   Sleep apnea is a risk factor for CVA and MI, atrial fibrillation, discussed REM dependent forms of apnea and treatment with CPAP or mandibular devices.     1) strong suspicion of OSA in morbidly obese and inactive elderly male, with snoring, fragmented sleep , nocturia 3-4 times at night. Naps in a recliner for 30 - 49 minutes daily . Epworth 16. Lots of aches , OA related. Weight related. Needs to be checked for obesity hypoventilation.    Plan:  Treatment plan and additional workup : CO2. Split  at  AHI 20 , 4% score,  [employer] insurance.

## 2014-02-01 ENCOUNTER — Encounter: Payer: Self-pay | Admitting: Neurology

## 2014-02-01 ENCOUNTER — Ambulatory Visit (INDEPENDENT_AMBULATORY_CARE_PROVIDER_SITE_OTHER): Payer: Medicare Other

## 2014-02-01 DIAGNOSIS — R0683 Snoring: Secondary | ICD-10-CM

## 2014-02-01 DIAGNOSIS — G4733 Obstructive sleep apnea (adult) (pediatric): Secondary | ICD-10-CM

## 2014-02-01 DIAGNOSIS — R0902 Hypoxemia: Secondary | ICD-10-CM

## 2014-02-01 DIAGNOSIS — R0602 Shortness of breath: Secondary | ICD-10-CM

## 2014-02-01 DIAGNOSIS — R0989 Other specified symptoms and signs involving the circulatory and respiratory systems: Secondary | ICD-10-CM

## 2014-02-01 DIAGNOSIS — R0609 Other forms of dyspnea: Secondary | ICD-10-CM

## 2014-02-10 ENCOUNTER — Encounter: Payer: Self-pay | Admitting: Neurology

## 2014-02-10 ENCOUNTER — Other Ambulatory Visit: Payer: Self-pay | Admitting: Neurology

## 2014-02-10 ENCOUNTER — Telehealth: Payer: Self-pay | Admitting: *Deleted

## 2014-02-10 DIAGNOSIS — G4733 Obstructive sleep apnea (adult) (pediatric): Secondary | ICD-10-CM

## 2014-02-10 NOTE — Telephone Encounter (Signed)
Patient was contacted and informed of the results of his overnight split night sleep study.  Patient was informed that his test revealed severe obstructive sleep apnea and that CPAP therapy was recommended by the doctor.  Patient was informed that [facility] had been selected to be his DME and they would be in contact with him to schedule a time to set him up on CPAP and speak with regards to any out of pocket expenses.  Patient was informed of the importance of CPAP therapy and  Compliance given the severity of his case.  Patient was informed that a copy would be mailed to him and a copy would be sent to the referring provider; Dr. Shon Baton.

## 2014-02-22 ENCOUNTER — Encounter: Payer: Self-pay | Admitting: Neurology

## 2014-03-04 ENCOUNTER — Encounter: Payer: Self-pay | Admitting: Neurology

## 2014-03-04 ENCOUNTER — Telehealth: Payer: Self-pay | Admitting: Neurology

## 2014-03-04 DIAGNOSIS — R0902 Hypoxemia: Secondary | ICD-10-CM

## 2014-03-04 DIAGNOSIS — Z9989 Dependence on other enabling machines and devices: Principal | ICD-10-CM

## 2014-03-04 DIAGNOSIS — G4733 Obstructive sleep apnea (adult) (pediatric): Secondary | ICD-10-CM

## 2014-03-05 ENCOUNTER — Other Ambulatory Visit: Payer: Self-pay | Admitting: Neurology

## 2014-03-05 DIAGNOSIS — R0902 Hypoxemia: Secondary | ICD-10-CM

## 2014-03-05 DIAGNOSIS — G4733 Obstructive sleep apnea (adult) (pediatric): Secondary | ICD-10-CM

## 2014-03-05 NOTE — Telephone Encounter (Signed)
I called the patient to review the results from his recent overnight pulse oximetry.  He is aware that his test results showed the need for nocturnal O2.  Pt needed additional understanding as he was under the impression that there would be a change to his current usage for CPAP.  An order has been submitted to [facility] for processing.

## 2014-03-05 NOTE — Telephone Encounter (Signed)
   Dear Mr. Mccleod,   It looks that you had a long time of low oxygen on the study from October 6th. I understand your disire to get more comfy with the CPAP first- but h your CPAP set up would not be changed, same machine , same mask , only oxygen would be pumped into CPAP and that mix of room air enriched with O2 would be delivered  by the same tubing and mask-  Let's hold off for 14 days - because your test results "expire " in the eyes of your insurance once older than 30 days.   Are you OK with that plan? Your, Larey Seat, MD

## 2014-03-19 IMAGING — CR DG CHEST 2V
3 series · 3 of 3 positions shown · non-contrast
Comparison: 06/29/2010

CLINICAL DATA: Lumbar disc protrusion.  Preoperative respiratory
exam.

CHEST - 2 VIEW

[view not recorded (1 of 3)]
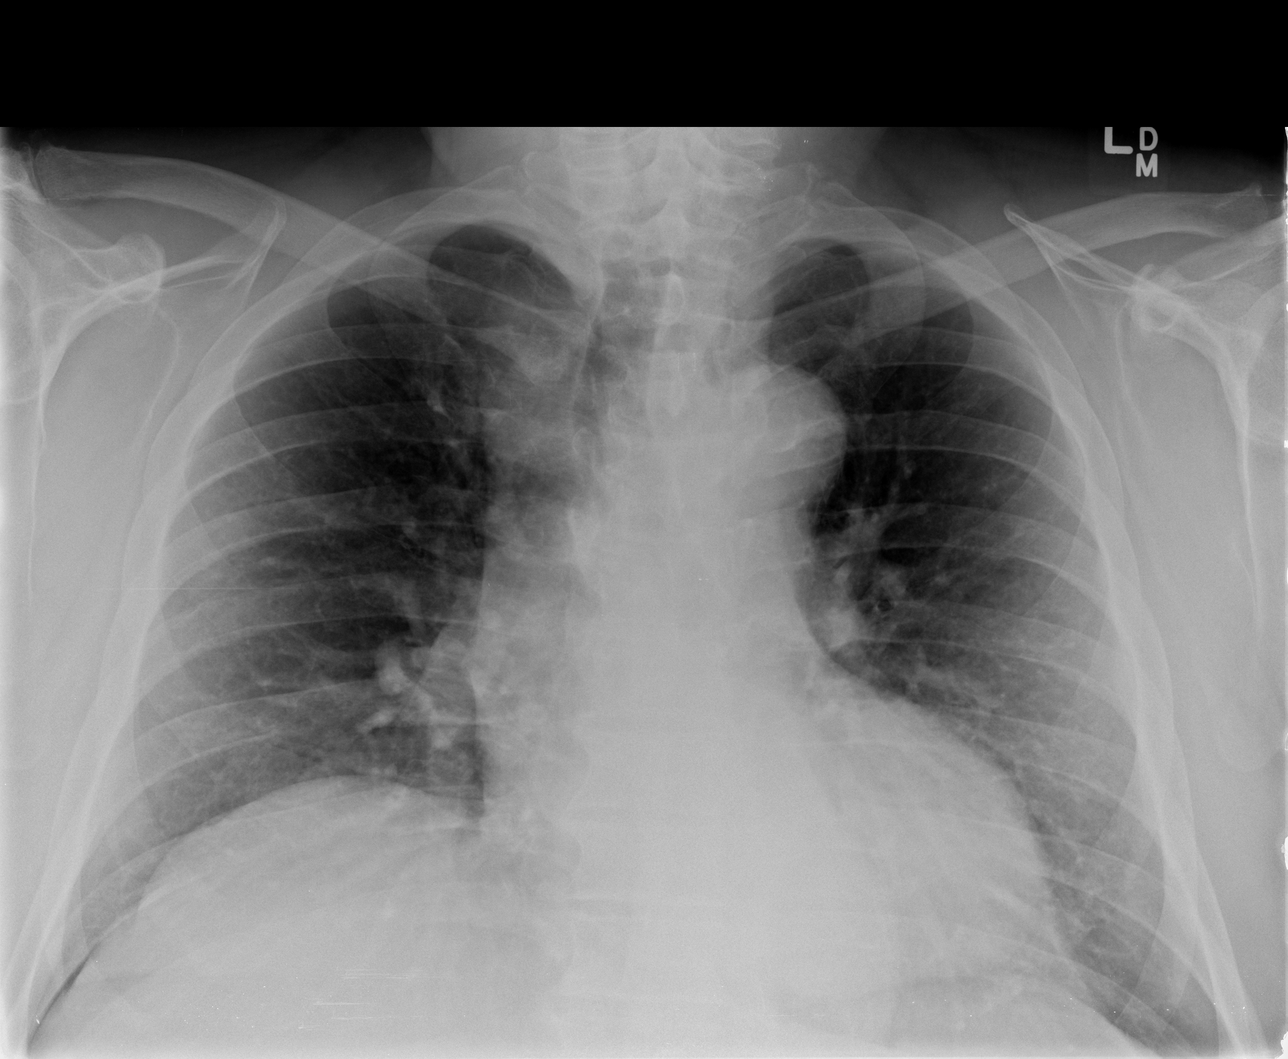

[view not recorded (2 of 3)]
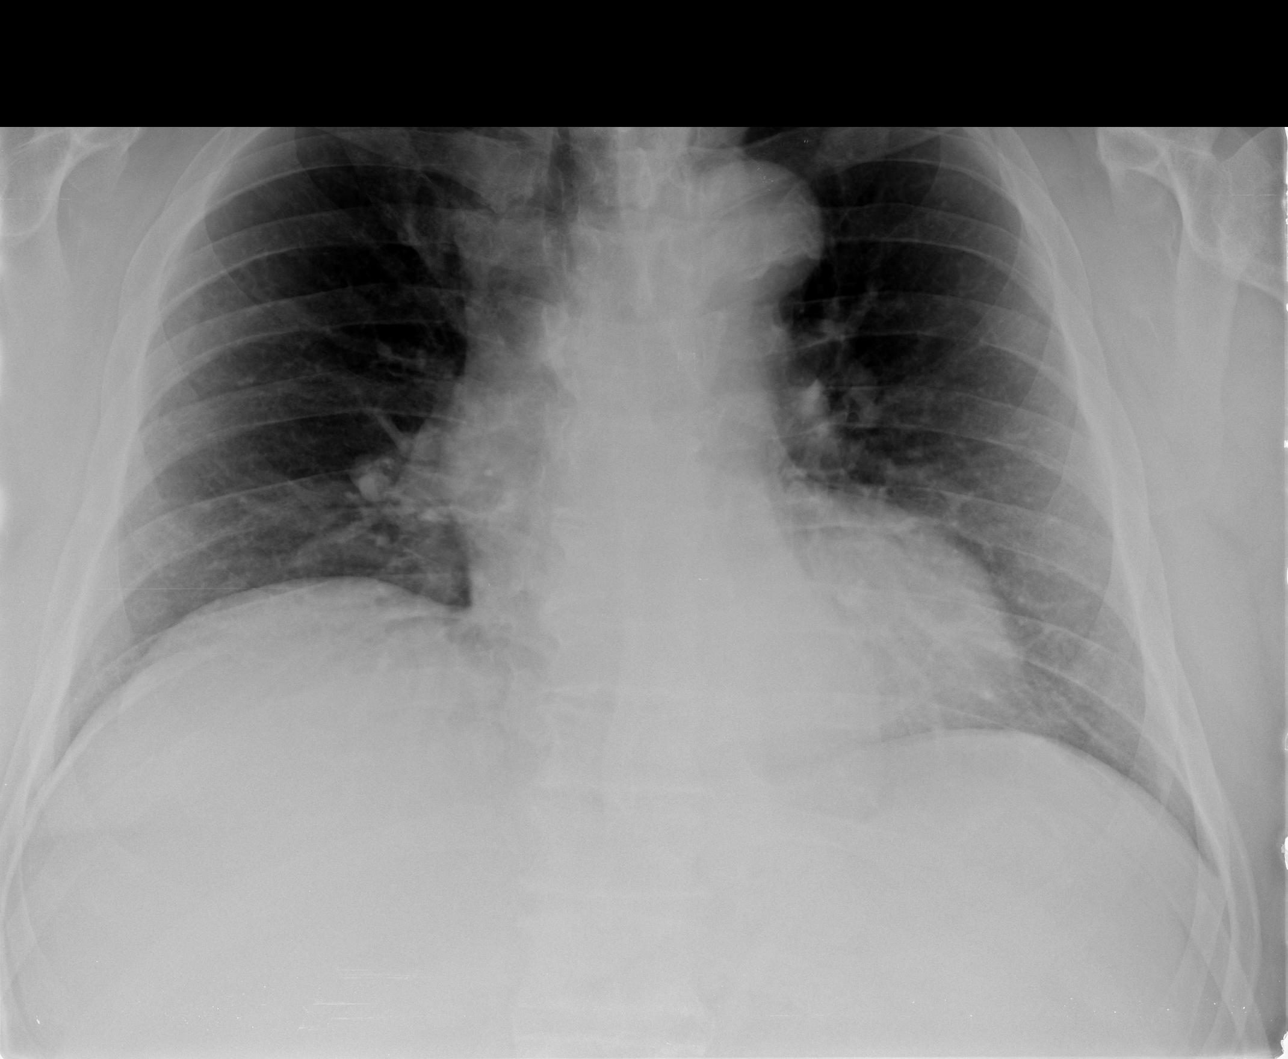

[view not recorded (3 of 3)]
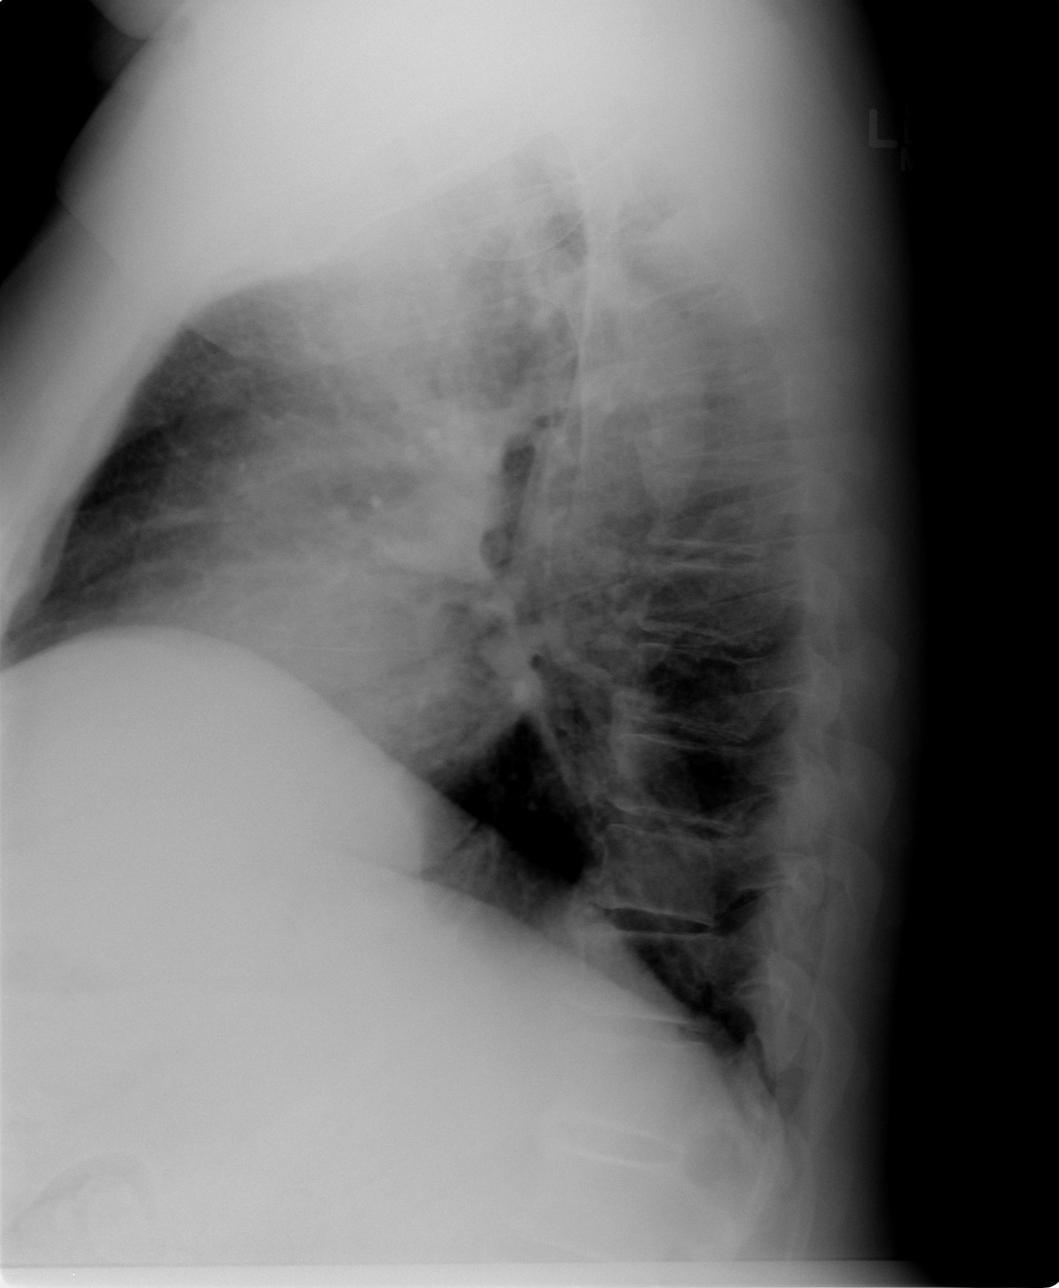

[3 of 3 positions shown; findings below may reference images not displayed]

FINDINGS: Heart size and pulmonary vascularity are normal.  There
is tortuosity and calcification of the thoracic aorta.  The lungs
are clear.  Chronic slight elevation of the right hemidiaphragm,
unchanged.  Chronic slight prominence of the superior mediastinum,
most likely due to tortuous brachiocephalic vessels.  No acute
osseous abnormality.
IMPRESSION: No acute disease in the chest.

## 2014-03-22 ENCOUNTER — Encounter: Payer: Self-pay | Admitting: Neurology

## 2014-03-25 ENCOUNTER — Encounter: Payer: Self-pay | Admitting: Neurology

## 2014-03-25 DIAGNOSIS — R0902 Hypoxemia: Secondary | ICD-10-CM

## 2014-03-25 DIAGNOSIS — Z9989 Dependence on other enabling machines and devices: Secondary | ICD-10-CM

## 2014-03-25 DIAGNOSIS — G4733 Obstructive sleep apnea (adult) (pediatric): Secondary | ICD-10-CM

## 2014-03-26 NOTE — Telephone Encounter (Signed)
Need 02 , ordered through DME. Can be bled into CPAP.  He requested a machine that is easier portable, to travel with.  Can he see [facility] for both?

## 2014-04-06 ENCOUNTER — Encounter: Payer: Self-pay | Admitting: Neurology

## 2014-04-07 ENCOUNTER — Encounter: Payer: Self-pay | Admitting: Neurology

## 2014-04-07 ENCOUNTER — Ambulatory Visit (INDEPENDENT_AMBULATORY_CARE_PROVIDER_SITE_OTHER): Payer: Medicare Other | Admitting: Neurology

## 2014-04-07 VITALS — BP 158/95 | HR 77 | Temp 98.4°F | Resp 16 | Ht 73.5 in | Wt 370.0 lb

## 2014-04-07 DIAGNOSIS — G4733 Obstructive sleep apnea (adult) (pediatric): Secondary | ICD-10-CM

## 2014-04-07 DIAGNOSIS — Z9989 Dependence on other enabling machines and devices: Secondary | ICD-10-CM

## 2014-04-07 DIAGNOSIS — R0902 Hypoxemia: Secondary | ICD-10-CM

## 2014-04-07 DIAGNOSIS — E662 Morbid (severe) obesity with alveolar hypoventilation: Secondary | ICD-10-CM

## 2014-04-07 HISTORY — DX: Obstructive sleep apnea (adult) (pediatric): G47.33

## 2014-04-07 HISTORY — DX: Morbid (severe) obesity with alveolar hypoventilation: E66.2

## 2014-04-07 NOTE — Telephone Encounter (Signed)
Patient needs a portable oxygen -bottle.  keep at 2 liters oxygen.

## 2014-04-07 NOTE — Patient Instructions (Signed)
Hypoxemia Hypoxemia occurs when your blood does not contain enough oxygen. The body cannot work well when it does not have enough oxygen because every part of your body needs oxygen. Oxygen travels to all parts of the body through your blood. Hypoxemia can develop suddenly or can come on slowly. CAUSES Some common causes of hypoxemia include:  Long-term (chronic) lung diseases, such as chronic obstructive pulmonary disease (COPD) or interstitial lung disease.  Disorders that affect breathing at night, such as sleep apnea.  Fluid buildup in your lungs (pulmonary edema).  Lung infection (pneumonia).  Lung or throat cancer.  Abnormal blood flow that bypasses the lungs (shunt).  Certain diseasesthat affect nerves or muscles.  A collapsed lung (pneumothorax).  A blood clot in the lungs (pulmonary embolus).  Certain types of heart disease.  Slow or shallow breathing (hypoventilation).  Certain medicines.  High altitudes.  Toxic chemicals and gases. SIGNS AND SYMPTOMS Not everyone who has hypoxemia will develop symptoms. If the hypoxemia developed quickly, you will likely have symptoms such as shortness of breath. If the hypoxemia came on slowly over months or years, you may not notice any symptoms. Symptoms can include:  Shortness of breath (dyspnea).  Bluish color of the skin, lips, or nail beds.  Breathing that is fast, noisy, or shallow.  A fast heartbeat.  Feeling tired or sleepy.  Being confused or feeling anxious. DIAGNOSIS To determine if you have hypoxemia, your health care provider may perform:  A physical exam.  Blood tests.  A pulse oximetry. A sensor will be put on your finger, toe, or earlobe to measure the percent of oxygen in your blood. TREATMENT You will likely be treated with oxygen therapy. Depending on the cause of your hypoxemia, you may need oxygen for a short time (weeks or months), or you may need it indefinitely. Your health care provider  may also recommend other therapies to treat the underlying cause of your hypoxemia. HOME CARE INSTRUCTIONS  Only take over-the-counter or prescription medicines as directed by your health care provider.  Follow oxygen safety measures if you are on oxygen therapy. These may include:  Always having a backup supply of oxygen.  Not allowing anyone to smoke around oxygen.  Handling the oxygen tanks carefully and as instructed.  If you smoke, quit. Stay away from people who smoke.  Follow up with your health care provider as directed. SEEK MEDICAL CARE IF:  You have any concerns about your oxygen therapy.  You still have trouble breathing.  You become short of breath when you exercise.  You are tired when you wake up.  You have a headache when you wake up. SEEK IMMEDIATE MEDICAL CARE IF:   Your breathing gets worse.  You have new shortness of breath with normal activity.  You have a bluish color of the skin, lips, or nail beds.  You have confusion or cloudy thinking.  You cough up dark mucus.  You have chest pain.  You have a fever. MAKE SURE YOU:  Understand these instructions.  Will watch your condition.  Will get help right away if you are not doing well or get worse. Document Released: 11/20/2010 Document Revised: 05/12/2013 Document Reviewed: 12/04/2012 Exit[employer] Patient Information 2015 [employer]. This information is not intended to replace advice given to you by your health care provider. Make sure you discuss any questions you have with your health care provider.  

## 2014-04-07 NOTE — Progress Notes (Signed)
[facility] SLEEP MEDICINE CONSULT  Provider:  Larey Seat, M D  Referring Provider: Precious Reel, MD Primary Care Physician:  Precious Reel, MD    HPI:  Tommy [name] is a 68 y.o.caucasian , right handed  male , who is seen here as a referral from Dr. Virgina Jock for sleep evaluation.   The patient  Is a retired Designer, jewellery, Music therapist. He has sleep problems of nocturia, snoring and GERD in sleep, EDS . He also has a history of osteoarthritis allergic rhinitis, GERD, hypertension, fatty liver disease. He has a family history of heart disease or hypertension.   [name] reports that she had back surgery ( laminectomy in  level L4-L5  ) and had reported to his orthopedists that he was excessively daytime sleepy and had trouble staying awake when driving longer distances. Dr. Rolena Infante asked him to make his primary care physician aware of this problem.  Prior to his back surgery in 2014 the patient was used to sleep in a recliner mainly because he could not find a comfortable position in bed. Since his back surgery this has improved tremendously and he is now sleeping in a regular bed. He usually goes to bed around 11:30 PM until fall asleep within 10 minutes. He'll stay asleep for 3-4 hours until his first bathroom break. He sleeps on the side, but his shoulder hurts , and he needs a pillow beneath the knee. He wakes up , rarely finding himself on the back.  He likes prone sleep, but had GERD in that position. He is a light sleeper. He shares a bathroom with his wife, or is concerned about a hearing and snoring and to hearing and stopping to breathe at night. The patient wakes up spontaneously with alarm at about 6:30 AM and feels usually that his sleep duration is  6 hours or more. He is not usually refreshed and restored in the morning but she also cannot get more sleep even if he would like to. He quit drinking sodas. He has one cup of coffee in AM , diet green tea,  6-8 a day.   He states that his aches and pains are over his if he sleeps longer.  He has aches and pain when exercising, gained weight even after back surgery, he gained 60 pounds before surgery in only 3 years. He is frequently short of breath when just speaking, not exercising. He has a sister with heart disease, a brother with obstructive sleep apnea ,  mother but was diagnosed as obstructive sleep apnea and his father is deceased with congestive heart failure.    Interval history 04-07-14   Mr. Reif underwent a split night polysomnography on [birth year]-01-49 after endorsing the Epworth Sleepiness Scale at 16 points and presenting with a neck circumference of 20 inches and a BMI of 48.5. The patient's AHI was 43.1 he did not have any REM sleep, he did not sleep supine for these 2 hours of diagnostic study - his CO2 increased to 54.44. His oxygen nadir was 84% for 43.1 minutes the patient was titrated and interestingly his oxygen saturations actually decreased further 164 minutes of desaturations were found during the CPAP titration with a nadir at 71% [name]. And he had nocturia several times at night. He was fitted with an O2 pressure window between 10 and 16 cm water. We also recommended possible oxygen therapy should his hypoxemia continue on CPAP. Today is the  patient's first visit after sleep study and he has noted some remarkable differences for example his Epworth Sleepiness Scale is now 10 and his fatigue severity score 41 points. The first 30 days of use he struggled with his CPAP and mostly slept in a recliner. He now has adjusted quite well at 30 day download dated 04-05-16 chills 5 hours and 33 minutes of daily use 100% compliance of full-time EPR level of 3 cm water and a pressure of 12.9 cm water at the 91st percentile. His residual AHI is 1.3. There were no Cheyne-Stokes respirations noted. An adjustment of settings is not necessary but the patient  wishes to not have a ramp function that. He has likely to use some additional oxygen. ONO showed on CPAP low oxygenation.  He has taken off the RAMP function and reduced the humidity.   He has not fallen asleep in church since being on CPAP.     Review of Systems: Out of a complete 14 system review, the patient complains of only the following symptoms, and all other reviewed systems are negative. He endorsed today an Epworth sleepiness score of 10 points, which is highly elevated and her fatigue severity of 47 points which is equally elevated. His geriatric depression scale was endorsed at 2 points.  This is normal.   History   Social History  . Marital Status: Married    Spouse Name: Tommy [name]    Number of Children: 2  . Years of Education: Masters   Occupational History  .     Social History Main Topics  . Smoking status: Never Smoker   . Smokeless tobacco: Never Used  . Alcohol Use: No  . Drug Use: No  . Sexual Activity: Not on file   Other Topics Concern  . Not on file   Social History Narrative   Patient is married Tommy [name]) and lives at home with his wife.   Patient has two adult children.   Patient is retired.   Patient has a Master's degree.   Patient is right-handed   Patient does not drink any caffeine.    Family History  Problem Relation Age of Onset  . Breast cancer Sister   . Heart attack Sister   . Dementia Mother   . Thyroid disease Sister     Past Medical History  Diagnosis Date  . Hypertension     sees Dr. Virgina Jock  . GERD (gastroesophageal reflux disease)   . H/O hiatal hernia   . Cancer     "melanoma removed from leg years ago"  . Anemia     "as teenager"  . Ulcer     stomach ulcer "as teenager"  . Chronic back pain   . Obesity, morbid 12/21/2013  . Snoring 12/21/2013  . Nocturia more than twice per night 12/21/2013  . Back pain   . Obesity hypoventilation syndrome 04/07/2014  . OSA on CPAP 04/07/2014    Past Surgical History   Procedure Laterality Date  . Cardiovascular stress test      "done approx 10 years ago, Dr. Virgina Jock"  . Lipoma removed      from leg  . Back surgery      "done approx. 24 years ago"  . Tonsillectomy    . Colonoscopy      hx of "with removal of polyps"  . Lumbar laminectomy/decompression microdiscectomy  06/19/2012    Procedure: LUMBAR LAMINECTOMY/DECOMPRESSION MICRODISCECTOMY 1 LEVEL;  Surgeon: Melina Schools, MD;  Location: [place];  Service: Orthopedics;  Laterality: Left;  L2-3 LEFT MICRODISCECTOMY    Current Outpatient Prescriptions  Medication Sig Dispense Refill  . amLODipine (NORVASC) 10 MG tablet Take 10 mg by mouth every morning.    Marland Kitchen FIBER PO Take 2 tablets by mouth every morning.    . Glucosamine-Chondroitin 750-600 MG TABS Take 2 tablets by mouth every morning.    Marland Kitchen HYDROcodone-acetaminophen (NORCO) 10-325 MG per tablet Take 1 tablet by mouth every 6 (six) hours as needed for pain. 60 tablet 0  . ipratropium (ATROVENT) 0.03 % nasal spray As needed    . methylPREDNIsolone (MEDROL DOSPACK) 4 MG tablet     . mometasone (NASONEX) 50 MCG/ACT nasal spray Place 2 sprays into the nose daily.    . montelukast (SINGULAIR) 10 MG tablet Take 10 mg by mouth every morning.    . naproxen (NAPROSYN) 500 MG tablet As needed    . oxyCODONE-acetaminophen (PERCOCET) 10-325 MG per tablet Take 1 tablet by mouth every 4 (four) hours as needed for pain. 60 tablet 0  . polyethylene glycol powder (GLYCOLAX) powder Take 17 g by mouth daily. 255 g 1  . valsartan-hydrochlorothiazide (DIOVAN-HCT) 320-25 MG per tablet Take 1 tablet by mouth every morning.     No current facility-administered medications for this visit.    Allergies as of 04/07/2014 - Review Complete 04/07/2014  Allergen Reaction Noted  . Micardis [telmisartan] Hives 06/19/2012    Vitals: BP 158/95 mmHg  Pulse 77  Temp(Src) 98.4 F (36.9 C) (Oral)  Resp 16  Ht 6' 1.5" (1.867 m)  Wt 370 lb (167.831 kg)  BMI 48.15 kg/m2 Last  Weight:  Wt Readings from Last 1 Encounters:  04/07/14 370 lb (167.831 kg)   Last Height:   Ht Readings from Last 1 Encounters:  04/07/14 6' 1.5" (1.867 m)    Physical exam:  General: The patient is awake, alert and appears not in acute distress. The patient is well groomed. Head: Normocephalic, atraumatic. Neck is supple. Mallampati 3 , neck circumference: 20 , full facial hair . Cardiovascular:  Regular rate and rhythm , without  murmurs or carotid bruit, and without distended neck veins. Respiratory: Lungs are clear to auscultation. Tachypnea. Skin:  Without evidence of edema, or rash Trunk: BMI is  elevated and patient  has normal posture.  Neurologic exam : The patient is awake and alert, oriented to place and time.  Memory subjective described as intact. There is a normal attention span & concentration ability.  Speech is fluent without dysarthria, dysphonia or aphasia. Mood and affect are appropriate.  Cranial nerves: Pupils are equal and briskly reactive to light. Funduscopic exam without  evidence of pallor or edema.  Extraocular movements  in vertical and horizontal planes intact and without nystagmus. Visual fields by finger perimetry are intact. Hearing to finger rub intact.  Facial sensation intact to fine touch. Facial motor strength is symmetric and tongue and uvula move midline.  Gait and station: Patient walks without assistive device . Wide based.  Deep tendon reflexes: in the  upper and lower extremities are symmetric and intact. Babinski maneuver response is  downgoing.   Assessment:  After physical and neurologic examination, review of laboratory studies, imaging, neurophysiology testing and pre-existing records, assessment is :   Sleep apnea on auto PAP 10-15 cm water , at this time without oxygen . ONO confirmed oxygen need.   1) obesity hypoventilation, with CO2 retention and apnea AHI 45 , and prolonged hypoxemia. .    Plan:  Treatment plan  and additional  workup : continue CPAP starting at 10 cm ( no ramp) and increasing to 15 cm water.  Oxygen was ordered, not yet delivered.

## 2014-04-07 NOTE — Addendum Note (Signed)
Addended by: Larey Seat on: 04/07/2014 09:57 AM   Modules accepted: Orders

## 2014-04-12 NOTE — Telephone Encounter (Signed)
Dear [name],   I can happily attest to your shortness of breath with exercise and need for  Portable oxygen unit for this reason.  That's a need you have. I ordered a portable 02 again.  [name].

## 2014-05-05 NOTE — Telephone Encounter (Signed)
Dear Mr pink, maye am sorry it took this long , but i have asked  A pulmonologist to see you and get 02 need documented for  Coverage. Shanon Brow Plemmons got a CC of this note.  CD

## 2014-05-05 NOTE — Telephone Encounter (Signed)
----- Message from Gilda Crease sent at 04/20/2014  4:07 PM EST ----- Regarding: RE: O2 If he reaches back out to you all, I would suggest that he go see a cardiologist/pulmologist to get tested for daytime O2 and a portable O2 system that will suit him if he qualifies. Thank you Gwinda Passe  ----- Message -----    From: Larey Seat, MD    Sent: 04/20/2014   1:56 PM      To: Gilda Crease, Virl Diamond Plemmons Subject: RE: O2                                         i don't have that equipment. CD ----- Message -----    From: Gilda Crease    Sent: 04/13/2014   4:28 PM      To: Larey Seat, MD Subject: RE: O2                                         SOB would not be a covered Dx for oxygen.  He also would need daytime O2 sats checked (resting on room air, with ambulation and on O2).  Could you all have him come in for an office visit and check his sats to see if he qualifies for continuous O2?  Thanks Gwinda Passe  ----- Message -----    From: Larey Seat, MD    Sent: 04/12/2014   3:33 PM      To: Gilda Crease Subject: RE: O2                                          Dear Gwinda Passe,   he is SOB in daytime , too. I will change him to need 02 in daytime for exertion.  Will this allow him to get a portable oxygen tank?  CD ----- Message -----    From: Gilda Crease    Sent: 04/07/2014   3:37 PM      To: Larey Seat, MD, [place] Subject: O2                                             Update: We spoke with patient about his order for a portable O2 system.  Since he is only on nocturnal O2 we discussed our portable system ([employer]) that he can be given anytime that he goes out of town.  This is a system that you get when you call us to notify us that you are going out of town, then we have one available for pickup at any of our retail locations.    Patient did not like this and we then discussed private paying for a unit he could own.  He did not like this option either.  He is  only wanting this unit for when he goes to the beach etc.  He frequently goes on vacation.  If he was on daytime also, he would be able to get a portable system through his insurance.  As it stands now, he is only on nocturnal O2.  He was not happy  with the choices that he has and I wanted to make you all aware. Thanks [employer]

## 2014-06-03 ENCOUNTER — Encounter: Payer: Self-pay | Admitting: Neurology

## 2014-10-06 ENCOUNTER — Encounter: Payer: Self-pay | Admitting: Nurse Practitioner

## 2014-10-06 ENCOUNTER — Ambulatory Visit (INDEPENDENT_AMBULATORY_CARE_PROVIDER_SITE_OTHER): Payer: Medicare Other | Admitting: Nurse Practitioner

## 2014-10-06 VITALS — BP 129/77 | HR 80 | Resp 20 | Ht 73.5 in | Wt 366.8 lb

## 2014-10-06 DIAGNOSIS — E662 Morbid (severe) obesity with alveolar hypoventilation: Secondary | ICD-10-CM | POA: Diagnosis not present

## 2014-10-06 DIAGNOSIS — G4733 Obstructive sleep apnea (adult) (pediatric): Secondary | ICD-10-CM

## 2014-10-06 DIAGNOSIS — Z9989 Dependence on other enabling machines and devices: Principal | ICD-10-CM

## 2014-10-06 NOTE — Progress Notes (Signed)
I agree with the assessment and plan as directed by NP .The patient is known to me .   Lerone Onder, MD  

## 2014-10-06 NOTE — Progress Notes (Signed)
[facility]  PATIENT: Tommy [name] DOB: Sep 16, 1945   REASON FOR VISIT:  Follow up for obstructive sleep apnea on CPAP HISTORY FROM: patient    HISTORY OF PRESENT ILLNESS: Tommy [name] , 69 year old male returns for follow-up. He was last seen in this office 04/07/2014 by Dr. Brett Fairy.  He has a history of morbid obesity and obstructive sleep apnea. He is currently on CPAP.between 10-16 cm of H20, ESS score is 5.  FSS is 32. He returns for reevaluation.  HISTORY: Tommy [name] is a 69 y.o.caucasian , right handed male , who is seen here as a referral from Dr. Virgina Jock for sleep evaluation.   The patient Is a retired Designer, jewellery, Music therapist. He has sleep problems of nocturia, snoring and GERD in sleep, EDS . He also has a history of osteoarthritis allergic rhinitis, GERD, hypertension, fatty liver disease. He has a family history of heart disease or hypertension.   Tommy [name] reports that she had back surgery ( laminectomy in level L4-L5 ) and had reported to his orthopedists that he was excessively daytime sleepy and had trouble staying awake when driving longer distances. Dr. Rolena Infante asked him to make his primary care physician aware of this problem.  Prior to his back surgery in 2014 the patient was used to sleep in a recliner mainly because he could not find a comfortable position in bed. Since his back surgery this has improved tremendously and he is now sleeping in a regular bed. He usually goes to bed around 11:30 PM until fall asleep within 10 minutes. He'll stay asleep for 3-4 hours until his first bathroom break. He sleeps on the side, but his shoulder hurts , and he needs a pillow beneath the knee. He wakes up , rarely finding himself on the back. He likes prone sleep, but had GERD in that position. He is a light sleeper. He shares a bathroom with his wife, or is concerned about a hearing and snoring and to hearing and stopping to breathe at  night. The patient wakes up spontaneously with alarm at about 6:30 AM and feels usually that his sleep duration is 6 hours or more. He is not usually refreshed and restored in the morning but she also cannot get more sleep even if he would like to. He quit drinking sodas. He has one cup of coffee in AM , diet green tea, 6-8 a day.  He states that his aches and pains are over his if he sleeps longer.  He has aches and pain when exercising, gained weight even after back surgery, he gained 60 pounds before surgery in only 3 years. He is frequently short of breath when just speaking, not exercising. He has a sister with heart disease, a brother with obstructive sleep apnea , mother but was diagnosed as obstructive sleep apnea and his father is deceased with congestive heart failure.    Interval history 04-07-14 Tommy [name] underwent a split night polysomnography on 02-01-49 after endorsing the Epworth Sleepiness Scale at 16 points and presenting with a neck circumference of 20 inches and a BMI of 48.5. The patient's AHI was 43.1 he did not have any REM sleep, he did not sleep supine for these 2 hours of diagnostic study - his CO2 increased to 54.44. His oxygen nadir was 84% for 43.1 minutes the patient was titrated and interestingly his oxygen saturations actually decreased further 164 minutes of desaturations were found during the CPAP titration with a nadir at 71%  [name]. And he had nocturia several times at night. He was fitted with an O2 pressure window between 10 and 16 cm water. We also recommended possible oxygen therapy should his hypoxemia continue on CPAP. Today is the patient's first visit after sleep study and he has noted some remarkable differences for example his Epworth Sleepiness Scale is now 10 and his fatigue severity score 41 points. The first 30 days of use he struggled with his CPAP and mostly slept in a recliner. He now has adjusted quite  well at 30 day download dated 04-05-16 chills 5 hours and 33 minutes of daily use 100% compliance of full-time EPR level of 3 cm water and a pressure of 12.9 cm water at the 91st percentile. His residual AHI is 1.3. There were no Cheyne-Stokes respirations noted. An adjustment of settings is not necessary but the patient wishes to not have a ramp function that. He has likely to use some additional oxygen. ONO showed on CPAP low oxygenation. He has taken off the RAMP function and reduced the humidity.  He has not fallen asleep in church since being on CPAP.   REVIEW OF SYSTEMS: Full 14 system review of systems performed and notable only for those listed, all others are neg:  Constitutional: neg  Cardiovascular: neg Ear/Nose/Throat: neg  Skin: neg Eyes: neg Respiratory: neg Gastroitestinal: neg  Hematology/Lymphatic: neg  Endocrine: neg Musculoskeletal:aching muscles Allergy/Immunology: neg Neurological: neg Psychiatric: neg Sleep : neg   ALLERGIES: Allergies  Allergen Reactions  . Micardis [Telmisartan] Hives    Rash/hives     HOME MEDICATIONS: Outpatient Prescriptions Prior to Visit  Medication Sig Dispense Refill  . amLODipine (NORVASC) 10 MG tablet Take 10 mg by mouth every morning.    Marland Kitchen FIBER PO Take 2 tablets by mouth every morning.    . Glucosamine-Chondroitin 750-600 MG TABS Take 2 tablets by mouth every morning.    Marland Kitchen ipratropium (ATROVENT) 0.03 % nasal spray As needed    . mometasone (NASONEX) 50 MCG/ACT nasal spray Place 2 sprays into the nose daily.    . montelukast (SINGULAIR) 10 MG tablet Take 10 mg by mouth every morning.    . naproxen (NAPROSYN) 500 MG tablet As needed    . valsartan-hydrochlorothiazide (DIOVAN-HCT) 320-25 MG per tablet Take 1 tablet by mouth every morning.    Marland Kitchen HYDROcodone-acetaminophen (NORCO) 10-325 MG per tablet Take 1 tablet by mouth every 6 (six) hours as needed for pain. 60 tablet 0  . methylPREDNIsolone (MEDROL DOSPACK) 4 MG tablet     .  oxyCODONE-acetaminophen (PERCOCET) 10-325 MG per tablet Take 1 tablet by mouth every 4 (four) hours as needed for pain. 60 tablet 0  . polyethylene glycol powder (GLYCOLAX) powder Take 17 g by mouth daily. 255 g 1   No facility-administered medications prior to visit.    PAST MEDICAL HISTORY: Past Medical History  Diagnosis Date  . Hypertension     sees Dr. Virgina Jock  . GERD (gastroesophageal reflux disease)   . H/O hiatal hernia   . Cancer     "melanoma removed from leg years ago"  . Anemia     "as teenager"  . Ulcer     stomach ulcer "as teenager"  . Chronic back pain   . Obesity, morbid 12/21/2013  . Snoring 12/21/2013  . Nocturia more than twice per night 12/21/2013  . Back pain   . Obesity hypoventilation syndrome 04/07/2014  . OSA on CPAP 04/07/2014  PAST SURGICAL HISTORY: Past Surgical History  Procedure Laterality Date  . Cardiovascular stress test      "done approx 10 years ago, Dr. Virgina Jock"  . Lipoma removed      from leg  . Back surgery      "done approx. 24 years ago"  . Tonsillectomy    . Colonoscopy      hx of "with removal of polyps"  . Lumbar laminectomy/decompression microdiscectomy  06/19/2012    Procedure: LUMBAR LAMINECTOMY/DECOMPRESSION MICRODISCECTOMY 1 LEVEL;  Surgeon: Melina Schools, MD;  Location: [place];  Service: Orthopedics;  Laterality: Left;  L2-3 LEFT MICRODISCECTOMY    FAMILY HISTORY: Family History  Problem Relation Age of Onset  . Breast cancer Sister   . Heart attack Sister   . Dementia Mother   . Thyroid disease Sister     SOCIAL HISTORY: History   Social History  . Marital Status: Married    Spouse Name: [name]  . Number of Children: 2  . Years of Education: Masters   Occupational History  .     Social History Main Topics  . Smoking status: Never Smoker   . Smokeless tobacco: Never Used  . Alcohol Use: No  . Drug Use: No  . Sexual Activity: Not on file   Other Topics Concern  . Not on file   Social History Narrative    Patient is married [name]) and lives at home with his wife.   Patient has two adult children.   Patient is retired.   Patient has a Master's degree.   Patient is right-handed   Patient does not drink any caffeine.     PHYSICAL EXAM  Filed Vitals:   10/06/14 1307  BP: 129/77  Pulse: 80  Resp: 20  Height: 6' 1.5" (1.867 m)  Weight: 366 lb 12.8 oz (166.379 kg)   Body mass index is 47.73 kg/(m^2). General: The patient is awake, alert and appears not in acute distress. The patient is well groomed. Head: Normocephalic, atraumatic.  Neck is supple. Mallampati 3 , neck circumference: 20 ,  Cardiovascular: Regular rate and rhythm , without murmurs  Respiratory: Lungs are clear to auscultation.  Skin: Without evidence of edema, or rash   Neurologic exam : The patient is awake and alert, oriented to place and time. Memory subjective described as intact. There is a normal attention span & concentration ability. Speech is fluent without dysarthria, dysphonia or aphasia. Mood and affect are appropriate.ESS 5. FSS 32.   Cranial nerves:Pupils are equal and briskly reactive to light. Funduscopic exam without evidence of pallor or edema. Extraocular movements in vertical and horizontal planes intact and without nystagmus. Visual fields by finger perimetry are intact.Hearing to finger rub intact. Facial sensation intact to fine touch. Facial motor strength is symmetric and tongue and uvula move midline. Motor full strength in the upper and lower extremities Gait and station: Patient walks without assistive device . Wide based.  Deep tendon reflexes: in the upper and lower extremities are symmetric and intact. Babinski maneuver response is downgoing.  ,DIAGNOSTIC DATA (LABS, IMAGING, TESTING) -   ASSESSMENT AND PLAN  69 y.o. year old male  has a past medical history of Hypertension; ; Anemia; Ulcer; Chronic back pain; Obesity, morbid (12/21/2013); Snoring (12/21/2013); Nocturia  more than twice per night (12/21/2013); Back pain; Obesity hypoventilation syndrome (04/07/2014); and OSA on CPAP (04/07/2014). here to follow-up. He is 100% compliant with his download 6 hours and 59 minutes. AHI 0.5  Excellent compliance with CPAP  continue same settings 100% compliant with his download 6 hours and 59 minutes. AHI 0.5, reviewed by Dr. Brett Fairy Follow-up yearly and when necessary for download of [place], [facility], [facility], APRN  [facility] [address], [place], [place] [address] [phone]

## 2014-10-06 NOTE — Patient Instructions (Signed)
Excellent compliance with CPAP continue same settings Follow-up yearly and when necessary for download of CPAP machine

## 2015-03-08 ENCOUNTER — Encounter: Payer: Self-pay | Admitting: Gastroenterology

## 2015-06-13 ENCOUNTER — Encounter: Payer: Self-pay | Admitting: Gastroenterology

## 2015-06-27 ENCOUNTER — Encounter: Payer: Self-pay | Admitting: Podiatry

## 2015-06-27 ENCOUNTER — Ambulatory Visit (INDEPENDENT_AMBULATORY_CARE_PROVIDER_SITE_OTHER): Payer: Medicare Other | Admitting: Podiatry

## 2015-06-27 VITALS — BP 180/103 | HR 77 | Resp 16 | Ht 73.0 in | Wt 370.0 lb

## 2015-06-27 DIAGNOSIS — M79676 Pain in unspecified toe(s): Secondary | ICD-10-CM | POA: Diagnosis not present

## 2015-06-27 DIAGNOSIS — B351 Tinea unguium: Secondary | ICD-10-CM | POA: Diagnosis not present

## 2015-06-27 DIAGNOSIS — L600 Ingrowing nail: Secondary | ICD-10-CM

## 2015-06-27 NOTE — Progress Notes (Signed)
   Subjective:    Patient ID: Tommy [name], male    DOB: 24-Apr-1946, 71 y.o.   MRN: [id number]  HPI Patient presents with bilateral nail problem. Pt stated, "had pedicure 2 weeks ago and nails have hurt since"; pt wants all nails looked at today.  He states that since his pedicure he did not believe that they cut the ingrown toenails out quite enough niece continued to have pain. Denies any surrounding redness or drainage. No recent injury or trauma. No swelling. No other complaints.   Review of Systems  Constitutional: Positive for fatigue.  All other systems reviewed and are negative.      Objective:   Physical Exam General: AAO x3, NAD  Dermatological:  Nails appear to be hypertrophic, dystrophic, discolored and slightly elongated and there is evidence of incurvation of both the medial and lateral nail borders of bilateral hallux and to the lesser digit toenails. There is tenderness in nails 1-5 bilaterally. There is no surrounding erythema or drainage or any edema. No open sores.  Vascular: Dorsalis Pedis artery and Posterior Tibial artery pedal pulses are 2/4 bilateral with immedate capillary fill time. Pedal hair growth present. No varicosities and no lower extremity edema present bilateral. There is no pain with calf compression, swelling, warmth, erythema.   Neruologic: Grossly intact via light touch bilateral. Vibratory intact via tuning fork bilateral. Protective threshold with Semmes Wienstein monofilament intact to all pedal sites bilateral. Patellar and Achilles deep tendon reflexes 2+ bilateral. No Babinski or clonus noted bilateral.   Musculoskeletal: No gross boney pedal deformities bilateral. No pain, crepitus, or limitation noted with foot and ankle range of motion bilateral. Muscular strength 5/5 in all groups tested bilateral.  Gait: Unassisted, Nonantalgic.      Assessment & Plan:   70 year old male with symptomatic onychodystrophy/onychomycosis /ingrown  toenail. -Treatment options discussed including all alternatives, risks, and complications -Etiology of symptoms were discussed -The nails are sharply debrided 10 without complications or bleeding. After debridement there was resolution of symptoms. -Daily foot inspection. -Follow-up as needed. In the meantime, encouraged to call the office with any questions, concerns, change in symptoms.   Celesta Gentile, DPM

## 2015-06-30 ENCOUNTER — Encounter: Payer: Self-pay | Admitting: Gastroenterology

## 2015-07-01 ENCOUNTER — Encounter: Payer: Self-pay | Admitting: Podiatry

## 2015-07-07 ENCOUNTER — Other Ambulatory Visit: Payer: Self-pay

## 2015-07-07 ENCOUNTER — Telehealth: Payer: Self-pay | Admitting: *Deleted

## 2015-07-07 DIAGNOSIS — Z1211 Encounter for screening for malignant neoplasm of colon: Secondary | ICD-10-CM

## 2015-07-07 NOTE — Telephone Encounter (Signed)
Direct schedule at [facility] on one of my Tuesday morning block session.

## 2015-07-07 NOTE — Telephone Encounter (Signed)
Patient is scheduled for 09/06/15 8:30.  Will need to arrive at 7:00 am [facility]

## 2015-07-07 NOTE — Telephone Encounter (Signed)
Patient is scheduled for PV tomorrow 07/08/15 for recall colonoscopy.  His current Weight as of 06/27/15 is 370 lbs.  Do you want to see him in the office or can he be direct at [facility]?  Please advise.

## 2015-07-08 ENCOUNTER — Ambulatory Visit (AMBULATORY_SURGERY_CENTER): Payer: Self-pay | Admitting: *Deleted

## 2015-07-08 VITALS — Ht 73.0 in | Wt 379.2 lb

## 2015-07-08 DIAGNOSIS — Z86010 Personal history of colonic polyps: Secondary | ICD-10-CM

## 2015-07-08 MED ORDER — SUPREP BOWEL PREP KIT 17.5-3.13-1.6 GM/177ML PO SOLN: 1.0000 | Freq: Once | ORAL | Status: DC

## 2015-07-08 NOTE — Progress Notes (Signed)
Patient denies any allergies to egg or soy products. Patient denies complications with anesthesia/sedation.  Patient uses CPAP nightly. Patient denies oxygen use at home and denies diet medications. Emmi instructions for colonoscopy explained but patient refused.

## 2015-07-22 ENCOUNTER — Encounter: Payer: Self-pay | Admitting: Gastroenterology

## 2015-08-23 ENCOUNTER — Encounter (HOSPITAL_COMMUNITY): Payer: Self-pay | Admitting: *Deleted

## 2015-09-06 ENCOUNTER — Ambulatory Visit (HOSPITAL_COMMUNITY): Payer: Medicare Other | Admitting: Anesthesiology

## 2015-09-06 ENCOUNTER — Encounter (HOSPITAL_COMMUNITY)
Admission: RE | Disposition: A | Discharge status: Home / Self Care | Payer: Self-pay | Source: Ambulatory Visit | Attending: Gastroenterology

## 2015-09-06 ENCOUNTER — Encounter (HOSPITAL_COMMUNITY): Payer: Self-pay | Admitting: Anesthesiology

## 2015-09-06 ENCOUNTER — Ambulatory Visit (HOSPITAL_COMMUNITY)
Admission: RE | Admit: 2015-09-06 | Discharge: 2015-09-06 | Disposition: A | Discharge status: Home / Self Care | Payer: Medicare Other | Source: Ambulatory Visit | Attending: Gastroenterology | Admitting: Gastroenterology

## 2015-09-06 DIAGNOSIS — Z85820 Personal history of malignant melanoma of skin: Secondary | ICD-10-CM | POA: Insufficient documentation

## 2015-09-06 DIAGNOSIS — D649 Anemia, unspecified: Secondary | ICD-10-CM | POA: Insufficient documentation

## 2015-09-06 DIAGNOSIS — I10 Essential (primary) hypertension: Secondary | ICD-10-CM | POA: Diagnosis not present

## 2015-09-06 DIAGNOSIS — K648 Other hemorrhoids: Secondary | ICD-10-CM | POA: Insufficient documentation

## 2015-09-06 DIAGNOSIS — Z79899 Other long term (current) drug therapy: Secondary | ICD-10-CM | POA: Insufficient documentation

## 2015-09-06 DIAGNOSIS — Z8371 Family history of colonic polyps: Secondary | ICD-10-CM | POA: Insufficient documentation

## 2015-09-06 DIAGNOSIS — Z7984 Long term (current) use of oral hypoglycemic drugs: Secondary | ICD-10-CM | POA: Insufficient documentation

## 2015-09-06 DIAGNOSIS — Z1211 Encounter for screening for malignant neoplasm of colon: Secondary | ICD-10-CM

## 2015-09-06 DIAGNOSIS — Z86010 Personal history of colonic polyps: Secondary | ICD-10-CM | POA: Insufficient documentation

## 2015-09-06 DIAGNOSIS — Z7982 Long term (current) use of aspirin: Secondary | ICD-10-CM | POA: Diagnosis not present

## 2015-09-06 DIAGNOSIS — D123 Benign neoplasm of transverse colon: Secondary | ICD-10-CM | POA: Diagnosis not present

## 2015-09-06 DIAGNOSIS — G4733 Obstructive sleep apnea (adult) (pediatric): Secondary | ICD-10-CM | POA: Insufficient documentation

## 2015-09-06 DIAGNOSIS — E119 Type 2 diabetes mellitus without complications: Secondary | ICD-10-CM | POA: Insufficient documentation

## 2015-09-06 DIAGNOSIS — K5730 Diverticulosis of large intestine without perforation or abscess without bleeding: Secondary | ICD-10-CM | POA: Insufficient documentation

## 2015-09-06 HISTORY — DX: Other complications of anesthesia, initial encounter: T88.59XA

## 2015-09-06 HISTORY — PX: COLONOSCOPY WITH PROPOFOL: SHX5780

## 2015-09-06 HISTORY — DX: Adverse effect of unspecified anesthetic, initial encounter: T41.45XA

## 2015-09-06 LAB — GLUCOSE, CAPILLARY: Glucose-Capillary: 130 mg/dL — ABNORMAL HIGH (ref 65–99)

## 2015-09-06 SURGERY — COLONOSCOPY WITH PROPOFOL
Anesthesia: Monitor Anesthesia Care

## 2015-09-06 MED ORDER — LACTATED RINGERS IV SOLN
INTRAVENOUS | Status: DC | PRN
  Administered 2015-09-06: 08:00:00 via INTRAVENOUS

## 2015-09-06 MED ORDER — SODIUM CHLORIDE 0.9 % IV SOLN: INTRAVENOUS | Status: DC

## 2015-09-06 MED ORDER — PROPOFOL 10 MG/ML IV BOLUS
INTRAVENOUS | Status: AC
  Filled 2015-09-06: qty 40

## 2015-09-06 MED ORDER — PROPOFOL 10 MG/ML IV BOLUS
INTRAVENOUS | Status: DC | PRN
  Administered 2015-09-06 (×2): 10 mg via INTRAVENOUS
  Administered 2015-09-06: 20 mg via INTRAVENOUS
  Administered 2015-09-06 (×2): 10 mg via INTRAVENOUS
  Administered 2015-09-06 (×5): 20 mg via INTRAVENOUS
  Administered 2015-09-06: 10 mg via INTRAVENOUS
  Administered 2015-09-06 (×2): 20 mg via INTRAVENOUS

## 2015-09-06 SURGICAL SUPPLY — 21 items

## 2015-09-06 NOTE — Anesthesia Postprocedure Evaluation (Signed)
Anesthesia Post Note  Patient: Tommy [name]  Procedure(s) Performed: Procedure(s) (LRB): COLONOSCOPY WITH PROPOFOL (N/A)  Patient location during evaluation: PACU Anesthesia Type: MAC Level of consciousness: awake and alert Pain management: pain level controlled Vital Signs Assessment: post-procedure vital signs reviewed and stable Respiratory status: spontaneous breathing, nonlabored ventilation, respiratory function stable and patient connected to nasal cannula oxygen Cardiovascular status: stable and blood pressure returned to baseline Anesthetic complications: no    Last Vitals:  Filed Vitals:   09/06/15 0924 09/06/15 0930  BP: 139/92 123/78  Pulse: 72 70  Temp: 36.6 C   Resp: 15 17    Last Pain: There were no vitals filed for this visit.               Alenna Russell J

## 2015-09-06 NOTE — H&P (Signed)
Primary Care Physician:  Precious Reel, MD Primary Gastroenterologist:  Lucio Edward, MD  CHIEF COMPLAINT:  history of adenomatous colon polyps  HPI: Tommy [name] is a 70 y.o. male with a history of adenomatous colon polyps due for a 5 year interval surveillance colonoscopy. He has OSA on CPAP, DM and morbid obesity. Denies weight loss, abdominal pain, constipation, diarrhea, change in stool caliber, melena, hematochezia, nausea, vomiting, dysphagia, reflux symptoms, chest pain.   Past Medical History  Diagnosis Date  . Hypertension     sees Dr. Virgina Jock  . H/O hiatal hernia   . Cancer ([place])     "melanoma removed from leg years ago"  . Anemia     "as teenager"  . Ulcer     stomach ulcer "as teenager"  . Chronic back pain   . Obesity, morbid ([place]) 12/21/2013  . Snoring 12/21/2013  . Nocturia more than twice per night 12/21/2013  . Back pain   . Obesity hypoventilation syndrome ([place]) 04/07/2014  . OSA on CPAP 04/07/2014  . Allergy   . GERD (gastroesophageal reflux disease)     diet controlled, no meds currently  . Sleep apnea     uses CPAP nightly  . Diabetes mellitus without complication ([place])   . Arthritis     hands  . Complication of anesthesia     woke up and was confused and combative after back surgery    Past Surgical History  Procedure Laterality Date  . Cardiovascular stress test      "done approx 10 years ago, Dr. Virgina Jock"  . Lipoma removed      from leg  . Back surgery      "done approx. 24 years ago"  . Tonsillectomy    . Lumbar laminectomy/decompression microdiscectomy  06/19/2012    Procedure: LUMBAR LAMINECTOMY/DECOMPRESSION MICRODISCECTOMY 1 LEVEL;  Surgeon: Melina Schools, MD;  Location: [facility];  Service: Orthopedics;  Laterality: Left;  L2-3 LEFT MICRODISCECTOMY  . Colonoscopy  2006    hx of "with removal of polyps"/[facility]  . Wisdom tooth extraction      Prior to Admission medications   Medication Sig Start Date End Date Taking? Authorizing  Provider  amLODipine (NORVASC) 10 MG tablet Take 10 mg by mouth every morning.    Yes Historical Provider, MD  aspirin 81 MG tablet Take 81 mg by mouth daily.   Yes Historical Provider, MD  FIBER PO Take 2 tablets by mouth every morning.   Yes Historical Provider, MD  Glucosamine-Chondroitin 750-600 MG TABS Take 2 tablets by mouth every morning.   Yes Historical Provider, MD  ipratropium (ATROVENT) 0.03 % nasal spray Place 1 spray into the nose 2 (two) times daily as needed for rhinitis. As needed 09/19/13  Yes Historical Provider, MD  metFORMIN (GLUCOPHAGE) 500 MG tablet Take 500 mg by mouth daily with breakfast.  05/10/15  Yes Historical Provider, MD  mometasone (NASONEX) 50 MCG/ACT nasal spray Place 2 sprays into the nose daily.   Yes Historical Provider, MD  montelukast (SINGULAIR) 10 MG tablet Take 10 mg by mouth every morning.   Yes Historical Provider, MD  Multiple Vitamin (MULTIVITAMIN) tablet Take 1 tablet by mouth daily.   Yes Historical Provider, MD  naproxen (NAPROSYN) 500 MG tablet Take 500 mg by mouth 2 (two) times daily with a meal. As needed 12/18/13  Yes Historical Provider, MD  [place] Take 1 kit by mouth once. Brand Name Only.  No Substitutions.  Suprep Bowel Kit.  07/08/15  Yes Ladene Artist, MD  valsartan-hydrochlorothiazide (DIOVAN-HCT) 320-25 MG per tablet Take 1 tablet by mouth every morning.   Yes Historical Provider, MD    Current Facility-Administered Medications  Medication Dose Route Frequency Provider Last Rate Last Dose  . 0.9 %  sodium chloride infusion   Intravenous Continuous Ladene Artist, MD       Facility-Administered Medications Ordered in Other Encounters  Medication Dose Route Frequency Provider Last Rate Last Dose  . lactated ringers infusion    Continuous PRN Ofilia Neas, CRNA        Allergies as of 07/07/2015 - Review Complete 06/27/2015  Allergen Reaction Noted  . Micardis [telmisartan] Hives 06/19/2012    Family History    Problem Relation Age of Onset  . Breast cancer Sister   . Heart attack Sister   . Dementia Mother   . Colon polyps Mother   . Thyroid disease Sister   . Colon cancer Neg Hx   . Esophageal cancer Neg Hx   . Rectal cancer Neg Hx   . Stomach cancer Neg Hx     Social History   Social History  . Marital Status: Married    Spouse Name: [name]  . Number of Children: 2  . Years of Education: Masters   Occupational History  .     Social History Main Topics  . Smoking status: Never Smoker   . Smokeless tobacco: Never Used  . Alcohol Use: No  . Drug Use: No  . Sexual Activity: Not on file   Other Topics Concern  . Not on file   Social History Narrative   Patient is married [name]) and lives at home with his wife.   Patient has two adult children.   Patient is retired.   Patient has a Master's degree.   Patient is right-handed   Patient does not drink any caffeine.    Review of Systems:  All systems reviewed an negative except where noted in HPI.  Gen: Denies any fever, chills, sweats, anorexia, fatigue, weakness, malaise, weight loss, and sleep disorder CV: Denies chest pain, angina, palpitations, syncope, orthopnea, PND, peripheral edema, and claudication. Resp: Denies dyspnea at rest, dyspnea with exercise, cough, sputum, wheezing, coughing up blood, and pleurisy. GI: Denies vomiting blood, jaundice, and fecal incontinence.   Denies dysphagia or odynophagia. GU : Denies urinary burning, blood in urine, urinary frequency, urinary hesitancy, nocturnal urination, and urinary incontinence. MS: Denies joint pain, limitation of movement, and swelling, stiffness, low back pain, extremity pain. Denies muscle weakness, cramps, atrophy.  Derm: Denies rash, itching, dry skin, hives, moles, warts, or unhealing ulcers.  Psych: Denies depression, anxiety, memory loss, suicidal ideation, hallucinations, paranoia, and confusion. Heme: Denies bruising, bleeding, and enlarged  lymph nodes. Neuro:  Denies any headaches, dizziness, paresthesias. Endo:  Denies any problems with DM, thyroid, adrenal function.  Physical Exam: Vital signs in last 24 hours: Temp:  [98.2 F (36.8 C)] 98.2 F (36.8 C) (04/18 0742) Pulse Rate:  [74] 74 (04/18 0742) Resp:  [17] 17 (04/18 0742) BP: (136)/(81) 136/81 mmHg (04/18 0742) SpO2:  [92 %] 92 % (04/18 0742)   General:  Alert, well-developed, morbidly obese, in NAD Head:  Normocephalic and atraumatic. Eyes:  Sclera clear, no icterus.   Conjunctiva pink. Ears:  Normal auditory acuity. Mouth:  No deformity or lesions.  Neck:  Supple; no masses . Lungs:  Clear throughout to auscultation.   No wheezes, crackles, or rhonchi. No acute distress. Heart:  Regular rate and rhythm; no murmurs. Abdomen:  Soft, nondistended, nontender. No masses, hepatomegaly. No obvious masses.  Normal bowel .    Rectal:  Deferred to colonoscopy   Msk:  Symmetrical without gross deformities.. Pulses:  Normal pulses noted. Extremities:  Without edema. Neurologic:  Alert and  oriented x4;  grossly normal neurologically. Skin:  Intact without significant lesions or rashes. Cervical Nodes:  No significant cervical adenopathy. Psych:  Alert and cooperative. Normal mood and affect.  Impression / Plan:  1. Personal history of adenomatous colon polyps for 5 year interval surveillance colonoscopy. The risks (including bleeding, perforation, infection, missed lesions, medication reactions and possible hospitalization or surgery if complications occur), benefits, and alternatives to colonoscopy with possible biopsy and possible polypectomy were discussed with the patient and they consent to proceed.      Pricilla Riffle. Fuller Plan MD 09/06/2015, 8:38 AM [phone] Mon-Fri 8a-5p  [phone] after 5p, weekends, holidays

## 2015-09-06 NOTE — Op Note (Signed)
[facility] Patient Name: Tommy [name] Procedure Date: 09/06/2015 MRN: [id number] Attending MD: Ladene Artist , MD Date of Birth: [birth year] CSN:  Age: 70 Admit Type: Outpatient Procedure:                Colonoscopy Indications:              Surveillance: Personal history of adenomatous                            polyps on last colonoscopy > 5 years ago Providers:                Pricilla Riffle. Fuller Plan, MD, Dustin Flock, RN, Cletis Athens, Technician Referring MD:             Shon Baton, MD Medicines:                Monitored Anesthesia Care Complications:            No immediate complications. Estimated Blood Loss:     Estimated blood loss was minimal. Procedure:                Pre-Anesthesia Assessment:                           - Prior to the procedure, a History and Physical                            was performed, and patient medications and                            allergies were reviewed. The patient's tolerance of                            previous anesthesia was also reviewed. The risks                            and benefits of the procedure and the sedation                            options and risks were discussed with the patient.                            All questions were answered, and informed consent                            was obtained. Prior Anticoagulants: The patient has                            taken no previous anticoagulant or antiplatelet                            agents. ASA Grade Assessment: III - A patient with  severe systemic disease. After reviewing the risks                            and benefits, the patient was deemed in                            satisfactory condition to undergo the procedure.                           After obtaining informed consent, the colonoscope                            was passed under direct vision. Throughout the                             procedure, the patient's blood pressure, pulse, and                            oxygen saturations were monitored continuously. The                            EC-[id number]) scope was introduced through                            the anus and advanced to the the cecum, identified                            by appendiceal orifice and ileocecal valve. The                            colonoscopy was somewhat difficult due to                            significant looping, a tortuous and elongated colon                            and the patient's body habitus. Successful                            completion of the procedure was aided by applying                            abdominal pressure. The patient tolerated the                            procedure fairly well. The quality of the bowel                            preparation was good. The ileocecal valve,                            appendiceal orifice, and rectum were photographed. Scope In: 8:51:03 AM Scope Out: 9:16:24 AM Scope Withdrawal Time: 0 hours 17 minutes 7 seconds  Total Procedure  Duration: 0 hours 25 minutes 21 seconds  Findings:      The digital rectal exam was normal.      Two sessile polyps were found in the transverse colon. The polyps were 6       to 8 mm in size. These polyps were removed with a cold snare. Resection       and retrieval were complete.      Many small-mouthed diverticula were found in the sigmoid colon and       descending colon. There was no evidence of diverticular bleeding.      Internal hemorrhoids were found during retroflexion. The hemorrhoids       were small and Grade I (internal hemorrhoids that do not prolapse).      The exam was otherwise normal throughout the examined colon.      No additional abnormalities were found on retroflexion. Impression:               - Two 6 to 8 mm polyps in the transverse colon,                            removed with a cold snare. Resected and retrieved.                            - Moderate diverticulosis in the sigmoid colon and                            in the descending colon.                           - Internal hemorrhoids. Moderate Sedation:      N/A- Per Anesthesia Care Recommendation:           - Patient has a contact number available for                            emergencies. The signs and symptoms of potential                            delayed complications were discussed with the                            patient. Return to normal activities tomorrow.                            Written discharge instructions were provided to the                            patient.                           - High fiber diet.                           - Continue present medications.                           - Await pathology results.                           -  Repeat colonoscopy in 5 years for surveillance if                            polyp(s) precancerous and health status appropriate                            for surveillance. Procedure Code(s):        --- Professional ---                           [phone], Colonoscopy, flexible; with removal of                            tumor(s), polyp(s), or other lesion(s) by snare                            technique Diagnosis Code(s):        --- Professional ---                           Z86.010, Personal history of colonic polyps                           D12.3, Benign neoplasm of transverse colon (hepatic                            flexure or splenic flexure)                           K64.0, First degree hemorrhoids                           K57.30, Diverticulosis of large intestine without                            perforation or abscess without bleeding CPT copyright 2016 American Medical Association. All rights reserved. The codes documented in this report are preliminary and upon coder review may  be revised to meet current compliance requirements. Ladene Artist, MD 09/06/2015 9:23:11  AM This report has been signed electronically. Number of Addenda: 0

## 2015-09-06 NOTE — Transfer of Care (Signed)
Immediate Anesthesia Transfer of Care Note  Patient: Tommy [name]  Procedure(s) Performed: Procedure(s): COLONOSCOPY WITH PROPOFOL (N/A)  Patient Location: PACU and Endoscopy Unit  Anesthesia Type:MAC  Level of Consciousness: awake, oriented, patient cooperative, lethargic and responds to stimulation  Airway & Oxygen Therapy: Patient connected to face mask oxygen  Post-op Assessment: Report given to RN, Post -op Vital signs reviewed and stable and Patient moving all extremities  Post vital signs: Reviewed and stable  Last Vitals:  Filed Vitals:   09/06/15 0742  BP: 136/81  Pulse: 74  Temp: 36.8 C  Resp: 17    Complications: No apparent anesthesia complications

## 2015-09-06 NOTE — Anesthesia Preprocedure Evaluation (Addendum)
Anesthesia Evaluation  Patient identified by MRN, date of birth, ID band Patient awake    Reviewed: Allergy & Precautions, NPO status , Patient's Chart, lab work & pertinent test results  History of Anesthesia Complications (+) history of anesthetic complications  Airway Mallampati: II  TM Distance: >3 FB Neck ROM: Full    Dental no notable dental hx.    Pulmonary asthma , sleep apnea and Continuous Positive Airway Pressure Ventilation ,    Pulmonary exam normal breath sounds clear to auscultation       Cardiovascular hypertension, Pt. on medications Normal cardiovascular exam Rhythm:Regular Rate:Normal     Neuro/Psych  Neuromuscular disease negative psych ROS   GI/Hepatic Neg liver ROS, hiatal hernia, GERD  ,  Endo/Other  diabetes, Type 2, Oral Hypoglycemic AgentsMorbid obesity  Renal/GU negative Renal ROS  negative genitourinary   Musculoskeletal  (+) Arthritis ,   Abdominal (+) + obese,   Peds negative pediatric ROS (+)  Hematology  (+) anemia ,   Anesthesia Other Findings   Reproductive/Obstetrics negative OB ROS                             Anesthesia Physical Anesthesia Plan  ASA: III  Anesthesia Plan: MAC   Post-op Pain Management:    Induction: Intravenous  Airway Management Planned: Natural Airway  Additional Equipment:   Intra-op Plan:   Post-operative Plan:   Informed Consent: I have reviewed the patients History and Physical, chart, labs and discussed the procedure including the risks, benefits and alternatives for the proposed anesthesia with the patient or authorized representative who has indicated his/her understanding and acceptance.   Dental advisory given  Plan Discussed with: CRNA  Anesthesia Plan Comments:         Anesthesia Quick Evaluation

## 2015-09-06 NOTE — Discharge Instructions (Signed)

## 2015-09-07 ENCOUNTER — Encounter (HOSPITAL_COMMUNITY): Payer: Self-pay | Admitting: Gastroenterology

## 2015-09-07 ENCOUNTER — Encounter: Payer: Self-pay | Admitting: Gastroenterology

## 2015-09-27 ENCOUNTER — Telehealth: Payer: Self-pay | Admitting: Cardiovascular Disease

## 2015-09-27 NOTE — Telephone Encounter (Signed)
5/092017 Received faxed referral packet from [facility] for upcoming appointment with Dr. Oval Linsey on 10/14/2015. Records given to [facility].  cbr

## 2015-10-06 ENCOUNTER — Ambulatory Visit (INDEPENDENT_AMBULATORY_CARE_PROVIDER_SITE_OTHER): Payer: Medicare Other | Admitting: Nurse Practitioner

## 2015-10-06 ENCOUNTER — Encounter: Payer: Self-pay | Admitting: Nurse Practitioner

## 2015-10-06 VITALS — BP 154/92 | HR 77 | Resp 20 | Ht 73.0 in | Wt 372.6 lb

## 2015-10-06 DIAGNOSIS — Z9989 Dependence on other enabling machines and devices: Principal | ICD-10-CM

## 2015-10-06 DIAGNOSIS — G4733 Obstructive sleep apnea (adult) (pediatric): Secondary | ICD-10-CM

## 2015-10-06 NOTE — Patient Instructions (Signed)
Compliance with CPAP good Drink plenty of water 8 to 10 glasses of water daily Follow up yearly and PRN

## 2015-10-06 NOTE — Progress Notes (Signed)
[facility]  PATIENT: Tommy [name] DOB: 01/25/1946   REASON FOR VISIT: Follow-up for obstructive sleep apnea with CPAP, morbid obesity HISTORY FROM: Patient    HISTORY OF PRESENT ILLNESS:UPDATE 05/18/2017CM Tommy [name] 70 year old male returns for follow-up he has a history of obstructive sleep apnea with CPAP and morbid obesity. CPAP compliance today from 09/05/2015 to 10/04/2015 with 100% compliance total usage 6 hours 49 minutes. Pressure at 10 cm to 16 cm. AHI 2.1. 1 leak noted in the last 30 days. ESS score is 8. FSS 37. He usually wakes up  once during the night. He returns for reevaluation  10/06/14 CMMr. Bowie , 70 year old male returns for follow-up. He was last seen in this office 04/07/2014 by Dr. Brett Fairy. He has a history of morbid obesity and obstructive sleep apnea. He is currently on CPAP.between 10-16 cm of H20, ESS score is 5. FSS is 32. He returns for reevaluation.  HISTORY: Tommy [name] is a 70 y.o.caucasian , right handed male , who is seen here as a referral from Dr. Virgina Jock for sleep evaluation.   The patient Is a retired Designer, jewellery, Music therapist. He has sleep problems of nocturia, snoring and GERD in sleep, EDS . He also has a history of osteoarthritis allergic rhinitis, GERD, hypertension, fatty liver disease. He has a family history of heart disease or hypertension.   [name] reports that she had back surgery ( laminectomy in level L4-L5 ) and had reported to his orthopedists that he was excessively daytime sleepy and had trouble staying awake when driving longer distances. Dr. Rolena Infante asked him to make his primary care physician aware of this problem.  Prior to his back surgery in 2014 the patient was used to sleep in a recliner mainly because he could not find a comfortable position in bed. Since his back surgery this has improved tremendously and he is now sleeping in a regular bed. He usually goes to bed  around 11:30 PM until fall asleep within 10 minutes. He'll stay asleep for 3-4 hours until his first bathroom break. He sleeps on the side, but his shoulder hurts , and he needs a pillow beneath the knee. He wakes up , rarely finding himself on the back. He likes prone sleep, but had GERD in that position. He is a light sleeper. He shares a bathroom with his wife, or is concerned about a hearing and snoring and to hearing and stopping to breathe at night. The patient wakes up spontaneously with alarm at about 6:30 AM and feels usually that his sleep duration is 6 hours or more. He is not usually refreshed and restored in the morning but she also cannot get more sleep even if he would like to. He quit drinking sodas. He has one cup of coffee in AM , diet green tea, 6-8 a day.  He states that his aches and pains are over his if he sleeps longer.  He has aches and pain when exercising, gained weight even after back surgery, he gained 60 pounds before surgery in only 3 years. He is frequently short of breath when just speaking, not exercising. He has a sister with heart disease, a brother with obstructive sleep apnea , mother but was diagnosed as obstructive sleep apnea and his father is deceased with congestive heart failure.    Interval history 11-18-15CD Tommy [name] underwent a split night polysomnography on 02-01-49 after endorsing the Epworth Sleepiness Scale at 16 points and presenting with a neck circumference  of 20 inches and a BMI of 48.5. The patient's AHI was 43.1 he did not have any REM sleep, he did not sleep supine for these 2 hours of diagnostic study - his CO2 increased to 54.44. His oxygen nadir was 84% for 43.1 minutes the patient was titrated and interestingly his oxygen saturations actually decreased further 164 minutes of desaturations were found during the CPAP titration with a nadir at 71% [name]. And he had nocturia several times at night.  He was fitted with an O2 pressure window between 10 and 16 cm water. We also recommended possible oxygen therapy should his hypoxemia continue on CPAP. Today is the patient's first visit after sleep study and he has noted some remarkable differences for example his Epworth Sleepiness Scale is now 10 and his fatigue severity score 41 points. The first 30 days of use he struggled with his CPAP and mostly slept in a recliner. He now has adjusted quite well at 30 day download dated 04-05-16 chills 5 hours and 33 minutes of daily use 100% compliance of full-time EPR level of 3 cm water and a pressure of 12.9 cm water at the 91st percentile. His residual AHI is 1.3. There were no Cheyne-Stokes respirations noted. An adjustment of settings is not necessary but the patient wishes to not have a ramp function that. He has likely to use some additional oxygen. ONO showed on CPAP low oxygenation. He has taken off the RAMP function and reduced the humidity.  He has not fallen asleep in church since being on CPAP.    REVIEW OF SYSTEMS: Full 14 system review of systems performed and notable only for those listed, all others are neg:  Constitutional: neg  Cardiovascular: neg Ear/Nose/Throat: neg  Skin: neg Eyes: neg Respiratory: Shortness of breath Gastroitestinal: neg  Hematology/Lymphatic: neg  Endocrine: neg Musculoskeletal: Back pain, muscle cramps Allergy/Immunology: Environmental allergies Neurological: neg Psychiatric: neg Sleep : Obstructive sleep apnea with CPAP   ALLERGIES: Allergies  Allergen Reactions  . Micardis [Telmisartan] Hives    Rash/hives     HOME MEDICATIONS: Outpatient Prescriptions Prior to Visit  Medication Sig Dispense Refill  . amLODipine (NORVASC) 10 MG tablet Take 10 mg by mouth every morning.     Marland Kitchen aspirin 81 MG tablet Take 81 mg by mouth daily.    Marland Kitchen FIBER PO Take 2 tablets by mouth every morning.    . Glucosamine-Chondroitin 750-600 MG TABS Take 2 tablets by mouth  every morning.    Marland Kitchen ipratropium (ATROVENT) 0.03 % nasal spray Place 1 spray into the nose 2 (two) times daily as needed for rhinitis. As needed    . metFORMIN (GLUCOPHAGE) 500 MG tablet Take 500 mg by mouth daily with breakfast.     . mometasone (NASONEX) 50 MCG/ACT nasal spray Place 2 sprays into the nose daily.    . montelukast (SINGULAIR) 10 MG tablet Take 10 mg by mouth every morning.    . Multiple Vitamin (MULTIVITAMIN) tablet Take 1 tablet by mouth daily.    . naproxen (NAPROSYN) 500 MG tablet Take 500 mg by mouth 2 (two) times daily with a meal. As needed    . valsartan-hydrochlorothiazide (DIOVAN-HCT) 320-25 MG per tablet Take 1 tablet by mouth every morning.    Marland Kitchen SUPREP BOWEL PREP SOLN Take 1 kit by mouth once. Brand Name Only.  No Substitutions.  Suprep Bowel Kit. (Patient not taking: Reported on 10/06/2015) 177 mL 0   No facility-administered medications  prior to visit.    PAST MEDICAL HISTORY: Past Medical History  Diagnosis Date  . Hypertension     sees Dr. Virgina Jock  . H/O hiatal hernia   . Cancer ([place])     "melanoma removed from leg years ago"  . Anemia     "as teenager"  . Ulcer     stomach ulcer "as teenager"  . Chronic back pain   . Obesity, morbid ([place]) 12/21/2013  . Snoring 12/21/2013  . Nocturia more than twice per night 12/21/2013  . Back pain   . Obesity hypoventilation syndrome ([place]) 04/07/2014  . OSA on CPAP 04/07/2014  . Allergy   . GERD (gastroesophageal reflux disease)     diet controlled, no meds currently  . Sleep apnea     uses CPAP nightly  . Diabetes mellitus without complication ([place])   . Arthritis     hands  . Complication of anesthesia     woke up and was confused and combative after back surgery    PAST SURGICAL HISTORY: Past Surgical History  Procedure Laterality Date  . Cardiovascular stress test      "done approx 10 years ago, Dr. Virgina Jock"  . Lipoma removed      from leg  . Back surgery      "done approx. 24 years ago"  . Tonsillectomy     . Lumbar laminectomy/decompression microdiscectomy  06/19/2012    Procedure: LUMBAR LAMINECTOMY/DECOMPRESSION MICRODISCECTOMY 1 LEVEL;  Surgeon: Melina Schools, MD;  Location: [facility];  Service: Orthopedics;  Laterality: Left;  L2-3 LEFT MICRODISCECTOMY  . Colonoscopy  2006    hx of "with removal of polyps"/[facility]  . Wisdom tooth extraction    . Colonoscopy with propofol N/A 09/06/2015    Procedure: COLONOSCOPY WITH PROPOFOL;  Surgeon: Ladene Artist, MD;  Location: [facility];  Service: Endoscopy;  Laterality: N/A;    FAMILY HISTORY: Family History  Problem Relation Age of Onset  . Breast cancer Sister   . Heart attack Sister   . Dementia Mother   . Colon polyps Mother   . Thyroid disease Sister   . Colon cancer Neg Hx   . Esophageal cancer Neg Hx   . Rectal cancer Neg Hx   . Stomach cancer Neg Hx     SOCIAL HISTORY: Social History   Social History  . Marital Status: Married    Spouse Name: [name]  . Number of Children: 2  . Years of Education: Masters   Occupational History  .     Social History Main Topics  . Smoking status: Never Smoker   . Smokeless tobacco: Never Used  . Alcohol Use: No  . Drug Use: No  . Sexual Activity: Not on file   Other Topics Concern  . Not on file   Social History Narrative   Patient is married [name]) and lives at home with his wife.   Patient has two adult children.   Patient is retired.   Patient has a Master's degree.   Patient is right-handed   Patient does not drink any caffeine.     PHYSICAL EXAM  Filed Vitals:   10/06/15 0914  BP: 154/92  Pulse: 77  Resp: 20  Height: '6\' 1"'$  (1.854 m)  Weight: 372 lb 9.6 oz (169.01 kg)   Body mass index is 49.17 kg/(m^2). General: The patient is awake, alert and appears not in acute distress. The patient is well groomed. Head: Normocephalic, atraumatic.  Neck is supple. Mallampati 3 , neck  circumference: 19  Cardiovascular: Regular rate and rhythm , without murmurs   Respiratory: Lungs are clear to auscultation.  Skin: Without evidence of edema, or rash   Neurologic exam : The patient is awake and alert, oriented to place and time. Memory subjective described as intact. There is a normal attention span & concentration ability. Speech is fluent without dysarthria, dysphonia or aphasia. Mood and affect are appropriate.ESS 8. FSS 37.   Cranial nerves:Pupils are equal and briskly reactive to light.  Extraocular movements in vertical and horizontal planes intact and without nystagmus. Visual fields by finger perimetry are intact.Hearing to finger rub intact. Facial sensation intact to fine touch. Facial motor strength is symmetric and tongue and uvula move midline. Motor full strength in the upper and lower extremities Gait and station: Patient walks without assistive device . Wide based.  Deep tendon reflexes: in the upper and lower extremities are symmetric and intact. Babinski maneuver response is downgoing.  DIAGNOSTIC DATA (LABS, IMAGING, TESTING) -  ASSESSMENT AND PLAN 70 y.o. year old male has a past medical history of Hypertension; ; Anemia; Ulcer; Chronic back pain; Obesity, morbid (12/21/2013); Snoring (12/21/2013); Nocturia more than twice per night (12/21/2013); Back pain; Obesity hypoventilation syndrome (04/07/2014); and OSA on CPAP (04/07/2014). here to follow-up. He is 100% compliant with his download 6 hours and 49 minutes. AHI 2.1  Excellent compliance with CPAP continue same settings  Follow-up yearly and when necessary for download of CPAP machine next with Dr. Brett Fairy Stay well hydrated by drinking water 8 to 10  glasses a day Dennie Bible, [facility], [facility], [facility] [address], [place],  [address] [phone]

## 2015-10-06 NOTE — Progress Notes (Signed)
I agree with the assessment and plan as directed by NP .The patient is known to me .   Verbon Giangregorio, MD  

## 2015-10-14 ENCOUNTER — Encounter: Payer: Self-pay | Admitting: Cardiovascular Disease

## 2015-10-14 ENCOUNTER — Ambulatory Visit (INDEPENDENT_AMBULATORY_CARE_PROVIDER_SITE_OTHER): Payer: Medicare Other | Admitting: Cardiovascular Disease

## 2015-10-14 VITALS — BP 146/86 | HR 86 | Ht 73.0 in | Wt 372.2 lb

## 2015-10-14 DIAGNOSIS — I10 Essential (primary) hypertension: Secondary | ICD-10-CM

## 2015-10-14 DIAGNOSIS — I2584 Coronary atherosclerosis due to calcified coronary lesion: Secondary | ICD-10-CM

## 2015-10-14 DIAGNOSIS — R0602 Shortness of breath: Secondary | ICD-10-CM | POA: Diagnosis not present

## 2015-10-14 DIAGNOSIS — I2510 Atherosclerotic heart disease of native coronary artery without angina pectoris: Secondary | ICD-10-CM

## 2015-10-14 NOTE — Patient Instructions (Addendum)
Medication Instructions:  Your physician recommends that you continue on your current medications as directed. Please refer to the Current Medication list given to you today.  Labwork: none  Testing/Procedures: Your physician has requested that you have a lexiscan myoview. For further information please visit [email or web address]. Please follow instruction sheet, as given. [place] has requested that you have an echocardiogram. Echocardiography is a painless test that uses sound waves to create images of your heart. It provides your doctor with information about the size and shape of your heart and how well your heart's chambers and valves are working. This procedure takes approximately one hour. There are no restrictions for this procedure. [place] [address]  Follow-Up: Your physician recommends that you schedule a follow-up appointment in: 4-6 [place] NEXT COUPLE OF WEEKS AND LET us KNOW HOW IT IS RUNNING   If you need a refill on your cardiac medications before your next appointment, please call your pharmacy.

## 2015-10-14 NOTE — Progress Notes (Signed)
Cardiology Office Note   Date:  10/16/2015   ID:  Tommy [name], DOB 08-01-1945, MRN [id number]  PCP:  Precious Reel, MD  Cardiologist:   Skeet Latch, MD   Chief Complaint  Patient presents with  . New Patient (Initial Visit)    SOB;when walking. PAIN OR CRAMPS IN LEGS;pain in legs due to back. EDEMA; in ankles occasionally      History of Present Illness: Tommy [name] is a 70 y.o. male with asthma, hypertension, GERD, fatty liver disease, OSA on CPAP and morbid obesity who presents for evaluation of shortness of breath. Tommy [name] saw his PCP, Dr. Shon Baton, on 09/20/15. At that appointment it was noted that a chest x-ray in January 2014 showed a tortuous aorta and calcifications of the thoracic aorta. He also reported dyspnea on exertion. He was referred to cardiology for further evaluation.  Before his last back surgery 20-3 years ago he was walking 2 miles daily.  Since then he hasn't been walking much and has gained 60 lb.  Although his back pain is better he now has has frequent hip and leg pain.  He wants to get back into a regular exercise routine but has noted significant shortness of breath, especially when walking up hills.  Before starting an exercise routine he wants to make sure that his heart is OK.  Tommy [name] denies chest pain or lower extremity edema.  He wears his CPAP machine faithfully.    Past Medical History  Diagnosis Date  . Hypertension     sees Dr. Virgina Jock  . H/O hiatal hernia   . Cancer ([place])     "melanoma removed from leg years ago"  . Anemia     "as teenager"  . Ulcer     stomach ulcer "as teenager"  . Chronic back pain   . Obesity, morbid ([place]) 12/21/2013  . Snoring 12/21/2013  . Nocturia more than twice per night 12/21/2013  . Back pain   . Obesity hypoventilation syndrome ([place]) 04/07/2014  . OSA on CPAP 04/07/2014  . Allergy   . GERD (gastroesophageal reflux disease)     diet controlled, no meds currently  . Sleep apnea       uses CPAP nightly  . Diabetes mellitus without complication ([place])   . Arthritis     hands  . Complication of anesthesia     woke up and was confused and combative after back surgery    Past Surgical History  Procedure Laterality Date  . Cardiovascular stress test      "done approx 10 years ago, Dr. Virgina Jock"  . Lipoma removed      from leg  . Back surgery      "done approx. 24 years ago"  . Tonsillectomy    . Lumbar laminectomy/decompression microdiscectomy  06/19/2012    Procedure: LUMBAR LAMINECTOMY/DECOMPRESSION MICRODISCECTOMY 1 LEVEL;  Surgeon: Melina Schools, MD;  Location: [facility];  Service: Orthopedics;  Laterality: Left;  L2-3 LEFT MICRODISCECTOMY  . Colonoscopy  2006    hx of "with removal of polyps"/[facility]  . Wisdom tooth extraction    . Colonoscopy with propofol N/A 09/06/2015    Procedure: COLONOSCOPY WITH PROPOFOL;  Surgeon: Ladene Artist, MD;  Location: [facility];  Service: Endoscopy;  Laterality: N/A;     Current Outpatient Prescriptions  Medication Sig Dispense Refill  . amLODipine (NORVASC) 10 MG tablet Take 10 mg by mouth every morning.     Marland Kitchen aspirin 81 MG tablet Take  81 mg by mouth daily.    Marland Kitchen FIBER PO Take 2 tablets by mouth every morning.    . Glucosamine-Chondroitin 750-600 MG TABS Take 2 tablets by mouth every morning.    Marland Kitchen ipratropium (ATROVENT) 0.03 % nasal spray Place 1 spray into the nose 2 (two) times daily as needed for rhinitis. As needed    . metFORMIN (GLUCOPHAGE) 500 MG tablet Take 500 mg by mouth daily with breakfast.     . mometasone (NASONEX) 50 MCG/ACT nasal spray Place 2 sprays into the nose daily.    . montelukast (SINGULAIR) 10 MG tablet Take 10 mg by mouth every morning.    . Multiple Vitamin (MULTIVITAMIN) tablet Take 1 tablet by mouth daily.    . naproxen (NAPROSYN) 500 MG tablet Take 500 mg by mouth 2 (two) times daily with a meal. As needed    . valsartan-hydrochlorothiazide (DIOVAN-HCT) 320-25 MG per tablet Take 1 tablet by mouth  every morning.     No current facility-administered medications for this visit.    Allergies:   Micardis    Social History:  The patient  reports that he has never smoked. He has never used smokeless tobacco. He reports that he does not drink alcohol or use illicit drugs.   Family History:  The patient's family history includes Breast cancer in his sister; Colon polyps in his mother; Dementia in his mother; Heart attack in his sister; Heart failure in his father; Thyroid disease in his sister. There is no history of Colon cancer, Esophageal cancer, Rectal cancer, or Stomach cancer.    ROS:  Please see the history of present illness.   Otherwise, review of systems are positive for none.   All other systems are reviewed and negative.    PHYSICAL EXAM: VS:  BP 146/86 mmHg  Pulse 86  Ht 6\' 1"  (1.854 m)  Wt 168.829 kg (372 lb 3.2 oz)  BMI 49.12 kg/m2 , BMI Body mass index is 49.12 kg/(m^2). GENERAL:  Well appearing.  HEENT:  Pupils equal round and reactive, fundi not visualized, oral mucosa unremarkable NECK:  No jugular venous distention, waveform within normal limits, carotid upstroke brisk and symmetric, no bruits, no thyromegaly LYMPHATICS:  No cervical adenopathy LUNGS:  Clear to auscultation bilaterally HEART:  RRR.  PMI not displaced or sustained,S1 and S2 within normal limits, no S3, no S4, no clicks, no rubs, no murmurs ABD:  Flat, positive bowel sounds normal in frequency in pitch, no bruits, no rebound, no guarding, no midline pulsatile mass, no hepatomegaly, no splenomegaly EXT:  2 plus pulses throughout, no edema, no cyanosis no clubbing SKIN:  No rashes no nodules NEURO:  Cranial nerves II through XII grossly intact, motor grossly intact throughout PSYCH:  Cognitively intact, oriented to person place and time   EKG:  EKG is ordered today. The ekg ordered today demonstrates sinus rhythm. Rate 6bpm. right bundle branch block. Left anterior fascicular block.   Recent  Labs: No results found for requested labs within last 365 days.    Lipid Panel No results found for: CHOL, TRIG, HDL, CHOLHDL, VLDL, LDLCALC, LDLDIRECT   09/13/15: Sodium 142, potassium 4.1, BUN 14, creatinine 0.9 AST 28, PLT 48 WBC 8.2, hemoglobin 14, hematocrit 42.1, platelets 224 Total cholesterol 118, triglycerides 138, HDL 28, LDL 62 TSH 3.37 Hemoglobin A1c 7.6%  Wt Readings from Last 3 Encounters:  10/14/15 168.829 kg (372 lb 3.2 oz)  10/06/15 169.01 kg (372 lb 9.6 oz)  07/08/15 172.004 kg (379 lb 3.2  oz)      ASSESSMENT AND PLAN:  # Shortness of breath:  Mr. Harringon's shortness of breath could be attributable to ischemia, morbid obesity or deconditioning.  Given that his symptoms are atypical, it is unlikely to represent ischemia.  However, it is reasonable to obtain a stress test before he starts his exercise routine.  Given his orthopedic issues he is unable to exercise.  He will undergo a 2 day Lexiscan Myoview.  We will also obtain an echo to rule out heart failure.  # Morbid obesity: Mr. Sisak is eager to start exercising.  Once he has the stress test we discussed exercising at least 30 minutes most days of the week.  # Hypertension: Mr. Berteau thinks that his BP is typically well-controlled.  He will keep a log of his blood pressures and call us if it is >140/90.  Continue amlodipine, valsartan and HCTZ.    Current medicines are reviewed at length with the patient today.  The patient does not have concerns regarding medicines.  The following changes have been made:  no change  Labs/ tests ordered today include:   Orders Placed This Encounter  Procedures  . Myocardial Perfusion Imaging  . EKG 12-Lead  . ECHOCARDIOGRAM COMPLETE     Disposition:   FU with Bing Duffey C. Oval Linsey, MD, [facility] in 4-6 weeks   This note was written with the assistance of speech recognition software.  Please excuse any transcriptional errors.  Signed, Angeleen Horney C. Oval Linsey,  MD, [facility]  10/16/2015 9:57 PM    [place]

## 2015-10-16 ENCOUNTER — Encounter: Payer: Self-pay | Admitting: Cardiovascular Disease

## 2015-10-16 DIAGNOSIS — I10 Essential (primary) hypertension: Secondary | ICD-10-CM

## 2015-10-16 HISTORY — DX: Essential (primary) hypertension: I10

## 2015-10-31 ENCOUNTER — Telehealth (HOSPITAL_COMMUNITY): Payer: Self-pay | Admitting: *Deleted

## 2015-10-31 NOTE — Telephone Encounter (Signed)
Left message on voicemail per DPR in reference to upcoming appointment scheduled on 11/02/15 with detailed instructions given per Myocardial Perfusion Study Information Sheet for the test. LM to arrive 15 minutes early, and that it is imperative to arrive on time for appointment to keep from having the test rescheduled. If you need to cancel or reschedule your appointment, please call the office within 24 hours of your appointment. Failure to do so may result in a cancellation of your appointment, and a $50 no show fee. Phone number given for call back for any questions. Hubbard Robinson, RN

## 2015-11-02 ENCOUNTER — Ambulatory Visit (HOSPITAL_COMMUNITY): Payer: Medicare Other | Attending: Cardiovascular Disease

## 2015-11-02 DIAGNOSIS — R9439 Abnormal result of other cardiovascular function study: Secondary | ICD-10-CM | POA: Insufficient documentation

## 2015-11-02 DIAGNOSIS — R0609 Other forms of dyspnea: Secondary | ICD-10-CM | POA: Diagnosis not present

## 2015-11-02 DIAGNOSIS — E119 Type 2 diabetes mellitus without complications: Secondary | ICD-10-CM | POA: Insufficient documentation

## 2015-11-02 DIAGNOSIS — I2584 Coronary atherosclerosis due to calcified coronary lesion: Secondary | ICD-10-CM

## 2015-11-02 DIAGNOSIS — R0602 Shortness of breath: Secondary | ICD-10-CM | POA: Insufficient documentation

## 2015-11-02 DIAGNOSIS — I2510 Atherosclerotic heart disease of native coronary artery without angina pectoris: Secondary | ICD-10-CM | POA: Diagnosis not present

## 2015-11-02 DIAGNOSIS — I119 Hypertensive heart disease without heart failure: Secondary | ICD-10-CM | POA: Insufficient documentation

## 2015-11-02 MED ORDER — REGADENOSON 0.4 MG/5ML IV SOLN
0.4000 mg | Freq: Once | INTRAVENOUS | Status: AC
  Administered 2015-11-02: 0.4 mg via INTRAVENOUS

## 2015-11-02 MED ORDER — TECHNETIUM TC 99M TETROFOSMIN IV KIT
31.9000 | PACK | Freq: Once | INTRAVENOUS | Status: AC | PRN
  Administered 2015-11-02: 31.9 via INTRAVENOUS
  Filled 2015-11-02: qty 32

## 2015-11-03 ENCOUNTER — Other Ambulatory Visit (HOSPITAL_COMMUNITY): Payer: Medicare Other

## 2015-11-03 ENCOUNTER — Other Ambulatory Visit: Payer: Self-pay

## 2015-11-03 ENCOUNTER — Ambulatory Visit (HOSPITAL_COMMUNITY): Payer: Medicare Other | Attending: Cardiovascular Disease

## 2015-11-03 ENCOUNTER — Other Ambulatory Visit (HOSPITAL_COMMUNITY): Payer: Self-pay | Admitting: *Deleted

## 2015-11-03 ENCOUNTER — Ambulatory Visit (HOSPITAL_BASED_OUTPATIENT_CLINIC_OR_DEPARTMENT_OTHER): Payer: Medicare Other

## 2015-11-03 DIAGNOSIS — Z6841 Body Mass Index (BMI) 40.0 and over, adult: Secondary | ICD-10-CM | POA: Insufficient documentation

## 2015-11-03 DIAGNOSIS — R0602 Shortness of breath: Secondary | ICD-10-CM

## 2015-11-03 DIAGNOSIS — I2510 Atherosclerotic heart disease of native coronary artery without angina pectoris: Secondary | ICD-10-CM | POA: Insufficient documentation

## 2015-11-03 DIAGNOSIS — G4733 Obstructive sleep apnea (adult) (pediatric): Secondary | ICD-10-CM | POA: Insufficient documentation

## 2015-11-03 DIAGNOSIS — I2584 Coronary atherosclerosis due to calcified coronary lesion: Secondary | ICD-10-CM | POA: Insufficient documentation

## 2015-11-03 DIAGNOSIS — R0600 Dyspnea, unspecified: Secondary | ICD-10-CM | POA: Diagnosis present

## 2015-11-03 DIAGNOSIS — I119 Hypertensive heart disease without heart failure: Secondary | ICD-10-CM | POA: Diagnosis not present

## 2015-11-03 LAB — ECHOCARDIOGRAM COMPLETE
AOASC: 46 cm
E decel time: 211 msec
EERAT: 10.95
FS: 43 % (ref 28–44)
IVS/LV PW RATIO, ED: 1.12
LA ID, A-P, ES: 42 mm
LA diam end sys: 42 mm
LA diam index: 1.38 cm/m2
LA vol index: 20.3 mL/m2
LAVOL: 61.5 mL
LAVOLA4C: 55.3 mL
LV E/e' medial: 10.95
LV E/e'average: 10.95
LV PW d: 11.3 mm — AB (ref 0.6–1.1)
LV TDI E'LATERAL: 6.42
LV TDI E'MEDIAL: 6.31
LV e' LATERAL: 6.42 cm/s
LVOT VTI: 28 cm
LVOT peak grad rest: 7 mmHg
LVOTPV: 129 cm/s
MV Dec: 211
MV pk A vel: 107 m/s
MV pk E vel: 70.3 m/s
RV LATERAL S' VELOCITY: 14 cm/s

## 2015-11-03 LAB — MYOCARDIAL PERFUSION IMAGING
CHL CUP NUCLEAR SDS: 0
LHR: 0.35
LV sys vol: 128 mL
LVDIAVOL: 228 mL (ref 62–150)
Peak HR: 90 {beats}/min
Rest HR: 75 {beats}/min
SRS: 3
SSS: 3
TID: 1.11

## 2015-11-03 MED ORDER — TECHNETIUM TC 99M TETROFOSMIN IV KIT
32.9000 | PACK | Freq: Once | INTRAVENOUS | Status: AC | PRN
  Administered 2015-11-03: 32.9 via INTRAVENOUS
  Filled 2015-11-03: qty 33

## 2015-11-03 MED ORDER — PERFLUTREN LIPID MICROSPHERE
1.0000 mL | INTRAVENOUS | Status: AC | PRN
  Administered 2015-11-03: 2 mL via INTRAVENOUS

## 2015-12-06 ENCOUNTER — Ambulatory Visit: Payer: Medicare Other | Admitting: Cardiovascular Disease

## 2015-12-07 ENCOUNTER — Ambulatory Visit (INDEPENDENT_AMBULATORY_CARE_PROVIDER_SITE_OTHER): Payer: Medicare Other | Admitting: Cardiovascular Disease

## 2015-12-07 ENCOUNTER — Encounter: Payer: Self-pay | Admitting: Cardiovascular Disease

## 2015-12-07 DIAGNOSIS — I10 Essential (primary) hypertension: Secondary | ICD-10-CM | POA: Diagnosis not present

## 2015-12-07 DIAGNOSIS — E785 Hyperlipidemia, unspecified: Secondary | ICD-10-CM

## 2015-12-07 MED ORDER — NEBIVOLOL HCL 5 MG PO TABS: 5.0000 mg | ORAL_TABLET | Freq: Every day | ORAL | Status: DC

## 2015-12-07 NOTE — Progress Notes (Signed)
Cardiology Office Note   Date:  12/07/2015   ID:  Tommy [name], DOB 29-Apr-1946, MRN [id number]  PCP:  Precious Reel, MD  Cardiologist:   Skeet Latch, MD   Chief Complaint  Patient presents with  . Shortness of Breath    pt c/o SOB, pt denied chest pain      History of Present Illness: Tommy [name] is a 70 y.o. male with asthma, hypertension, GERD, fatty liver disease, OSA on CPAP and morbid obesity who presents for follow up on shortness of breath. Tommy [name] saw his PCP, Dr. Shon Baton, on 09/20/15. At that appointment it was noted that a chest x-ray in January 2014 showed a tortuous aorta and calcifications of the thoracic aorta. He also reported dyspnea on exertion. He was referred to cardiology for further evaluation.  He was referred for a Lexiscan Myoview 10/2015 that was negative for ischemia but the ejection fraction was reported as 30-44%. He subsequently had an echocardiogram that revealed normal systolic function and mild LVH.  Diastolic function was abnormal but the degree of dysfunction was not commented upon and the echo is not currently available for review.  Since his last appointment Tommy [name] has been doing well.  He continues to have exertional shortness of breath. 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 He denies any chest pain or pressure. He checks his blood pressure regularly at home and notes that it is mostly in the 150s to 160s.  He has not noted any chest pains or shortness of breath. He takes naproxen daily to help with lower extremity pain and soreness.   He has not been getting any regular exercise and notes that he eats out 15 or 16 times per week.    Past Medical History  Diagnosis Date  . Hypertension     sees Dr. Virgina Jock  . H/O hiatal hernia   . Cancer ([place])     "melanoma removed from leg years ago"  . Anemia     "as teenager"  . Ulcer     stomach ulcer "as teenager"  .  Chronic back pain   . Obesity, morbid ([place]) 12/21/2013  . Snoring 12/21/2013  . Nocturia more than twice per night 12/21/2013  . Back pain   . Obesity hypoventilation syndrome ([place]) 04/07/2014  . OSA on CPAP 04/07/2014  . Allergy   . GERD (gastroesophageal reflux disease)     diet controlled, no meds currently  . Sleep apnea     uses CPAP nightly  . Diabetes mellitus without complication ([place])   . Arthritis     hands  . Complication of anesthesia     woke up and was confused and combative after back surgery  . Essential hypertension 10/16/2015    Past Surgical History  Procedure Laterality Date  . Cardiovascular stress test      "done approx 10 years ago, Dr. Virgina Jock"  . Lipoma removed      from leg  . Back surgery      "done approx. 24 years ago"  . Tonsillectomy    . Lumbar laminectomy/decompression microdiscectomy  06/19/2012    Procedure: LUMBAR LAMINECTOMY/DECOMPRESSION MICRODISCECTOMY 1 LEVEL;  Surgeon: Melina Schools, MD;  Location: [facility];  Service: Orthopedics;  Laterality: Left;  L2-3 LEFT MICRODISCECTOMY  . Colonoscopy  2006    hx of "with removal of polyps"/[facility]  . Wisdom tooth extraction    .  Colonoscopy with propofol N/A 09/06/2015    Procedure: COLONOSCOPY WITH PROPOFOL;  Surgeon: Ladene Artist, MD;  Location: [facility];  Service: Endoscopy;  Laterality: N/A;     Current Outpatient Prescriptions  Medication Sig Dispense Refill  . amLODipine (NORVASC) 10 MG tablet Take 10 mg by mouth every morning.     Marland Kitchen aspirin 81 MG tablet Take 81 mg by mouth daily.    Marland Kitchen FIBER PO Take 2 tablets by mouth every morning.    . Glucosamine-Chondroitin 750-600 MG TABS Take 2 tablets by mouth every morning.    Marland Kitchen ipratropium (ATROVENT) 0.03 % nasal spray Place 1 spray into the nose 2 (two) times daily as needed for rhinitis. As needed    . metFORMIN (GLUCOPHAGE) 500 MG tablet Take 500 mg by mouth daily with breakfast.     . mometasone (NASONEX) 50 MCG/ACT nasal spray Place 2 sprays  into the nose daily.    . montelukast (SINGULAIR) 10 MG tablet Take 10 mg by mouth every morning.    . Multiple Vitamin (MULTIVITAMIN) tablet Take 1 tablet by mouth daily.    . naproxen (NAPROSYN) 500 MG tablet Take 500 mg by mouth 2 (two) times daily with a meal. As needed    . valsartan-hydrochlorothiazide (DIOVAN-HCT) 320-25 MG per tablet Take 1 tablet by mouth every morning.    . nebivolol (BYSTOLIC) 5 MG tablet Take 1 tablet (5 mg total) by mouth daily. 90 tablet 3   No current facility-administered medications for this visit.    Allergies:   Micardis    Social History:  The patient  reports that he has never smoked. He has never used smokeless tobacco. He reports that he does not drink alcohol or use illicit drugs.   Family History:  The patient's family history includes Breast cancer in his sister; Colon polyps in his mother; Dementia in his mother; Heart attack in his sister; Heart failure in his father; Thyroid disease in his sister. There is no history of Colon cancer, Esophageal cancer, Rectal cancer, or Stomach cancer.    ROS:  Please see the history of present illness.   Otherwise, review of systems are positive for none.   All other systems are reviewed and negative.    PHYSICAL EXAM: VS:  BP 156/96 mmHg  Pulse 84  Ht 6' 1.5" (1.867 m)  Wt 374 lb 3.2 oz (169.736 kg)  BMI 48.70 kg/m2 , BMI Body mass index is 48.7 kg/(m^2). GENERAL:  Well appearing.  Morbidly obese. HEENT:  Pupils equal round and reactive, fundi not visualized, oral mucosa unremarkable NECK:  No jugular venous distention, waveform within normal limits, carotid upstroke brisk and symmetric, no bruits, no thyromegaly LYMPHATICS:  No cervical adenopathy LUNGS:  Clear to auscultation bilaterally HEART:  RRR.  PMI not displaced or sustained,S1 and S2 within normal limits, no S3, no S4, no clicks, no rubs, no murmurs ABD:  Flat, positive bowel sounds normal in frequency in pitch, no bruits, no rebound, no  guarding, no midline pulsatile mass, no hepatomegaly, no splenomegaly EXT:  2 plus pulses throughout, trace edema, no cyanosis no clubbing SKIN:  No rashes no nodules NEURO:  Cranial nerves II through XII grossly intact, motor grossly intact throughout PSYCH:  Cognitively intact, oriented to person place and time   EKG:  EKG is not ordered today. The ekg ordered 10/14/15 demonstrates sinus rhythm. Rate 6bpm. right bundle branch block. Left anterior fascicular block.  Echo 11/03/15: Study Conclusions  - Procedure narrative: Transthoracic echocardiography.  Image  quality was suboptimal. The study was technically difficult.  Intravenous contrast (Definity) was administered. - Left ventricle: The cavity size was mildly dilated. Wall  thickness was increased in a pattern of mild LVH. Systolic  function was normal. The estimated ejection fraction was in the  range of 55% to 60%. Wall motion was normal; there were no  regional wall motion abnormalities. Acoustic contrast  opacification revealed no evidence ofthrombus. - Left atrium: The atrium was mildly dilated. - Right ventricle: The cavity size was mildly dilated.   Lexiscan Myoview 11/03/15:  Nuclear stress EF: 44%.  There was no ST segment deviation noted during stress.  No T wave inversion was noted during stress.  Defect 1: There is a small defect of moderate severity present in the apex location. Apical thinning vs prior infarct.  This is an intermediate risk study due to reduced LVEF.  The left ventricular ejection fraction is moderately decreased (30-44%).  No ischemia.  Recent Labs: No results found for requested labs within last 365 days.    Lipid Panel No results found for: CHOL, TRIG, HDL, CHOLHDL, VLDL, LDLCALC, LDLDIRECT   09/13/15: Sodium 142, potassium 4.1, BUN 14, creatinine 0.9 AST 28, PLT 48 WBC 8.2, hemoglobin 14, hematocrit 42.1, platelets 224 Total cholesterol 118, triglycerides 138, HDL 28,  LDL 62 TSH 3.37 Hemoglobin A1c 7.6%  Wt Readings from Last 3 Encounters:  12/07/15 374 lb 3.2 oz (169.736 kg)  10/14/15 372 lb 3.2 oz (168.829 kg)  10/06/15 372 lb 9.6 oz (169.01 kg)      ASSESSMENT AND PLAN:  # Shortness of breath:  Tommy [name] shortness of breath persists.  His echo showed normal systolic function. There was some degree of diastolic dysfunction but I was unable to do the images and there is no comment on this study. His chest S was negative for ischemia. I suspect that the shortness of breath is mostly interpretable to deconditioning and morbid obesity.  We discussed the importance of increased physical activity and weight loss.    # Morbid obesity: We discussed the importance of weight loss.  I suggested that he limit his eating out to no more than twice per week as he is currently eating out for almost all meals.  He also needs to increase his physical activity.  He expressed understanding.  He is not interested in gastric bypass, as he does not think he would be able to change his eating habits and would end up in the same position.  # Hypertension:  blood pressure is poorly-controlled. We will add Bystolic 5 mg daily.  He was concerned about adding metoprolol or carvedilol due to the M possibility of fatigue. Continue amlodipine, valsartan and HCTZ.  # CV Disease Prevention:  ASCVD 10 year risk is 29%.  This is mostly attributable to his low HDL.  However, his LDL is 62. Therefore, we will not start a statin. He will start with diet and exercise as above.   Current medicines are reviewed at length with the patient today.  The patient does not have concerns regarding medicines.  The following changes have been made:  no change  Labs/ tests ordered today include:   No orders of the defined types were placed in this encounter.    Time spent: 25 minutes-Greater than 50% of this time was spent in counseling, explanation of diagnosis, planning of further  management, and coordination of care.  Disposition:   FU with Maebelle Sulton C. Oval Linsey, MD, [facility] in 1 year.  This note was written with the assistance of speech recognition software.  Please excuse any transcriptional errors.  Signed, Taytum Scheck C. Oval Linsey, MD, [facility]  12/07/2015 12:44 PM    [facility]

## 2015-12-07 NOTE — Patient Instructions (Addendum)
Your physician has recommended you make the following change in your medication:    START Bystolic 5 mg daily   Your physician wants you to follow-up in: 1 year with Dr. Oval Linsey. You will receive a reminder letter in the mail two months in advance. If you don't receive a letter, please call our office to schedule the follow-up appointment.  Follow up in 2 weeks with a pharmacist: Kirstin Alvstad or Eino Farber.   If you need a refill on your cardiac medications before your next appointment, please call your pharmacy.

## 2015-12-22 ENCOUNTER — Encounter: Payer: Self-pay | Admitting: Pharmacist

## 2015-12-22 ENCOUNTER — Ambulatory Visit (INDEPENDENT_AMBULATORY_CARE_PROVIDER_SITE_OTHER): Payer: Medicare Other | Admitting: Pharmacist

## 2015-12-22 ENCOUNTER — Telehealth: Payer: Self-pay | Admitting: Cardiovascular Disease

## 2015-12-22 VITALS — BP 158/92 | HR 57 | Ht 73.5 in | Wt 370.6 lb

## 2015-12-22 DIAGNOSIS — I10 Essential (primary) hypertension: Secondary | ICD-10-CM | POA: Diagnosis not present

## 2015-12-22 MED ORDER — NEBIVOLOL HCL 10 MG PO TABS: 10.0000 mg | ORAL_TABLET | Freq: Every day | ORAL | 1 refills | Status: DC

## 2015-12-22 MED ORDER — NEBIVOLOL HCL 10 MG PO TABS: 5.0000 mg | ORAL_TABLET | Freq: Every day | ORAL | 1 refills | Status: DC

## 2015-12-22 NOTE — Telephone Encounter (Signed)
Returned call to pharmacy-pharmacy states that a new prescription was sent in today for bystolic 10mg  ( take 1/2 tablet daily)  Reports that the 10mg  tablets do not break easily.  Also state that patient just picked up rx for this medication and wanted to know why another was sent.   Pt was seen today by Georgina Peer clinical Pharmacist for BP check.  Assessment/Plan: Hypertension: BP today is not at goal. Increase Bystolic to 10mg  daily. Follow up in hypertension clinic in 4 weeks.    Thank you, Lelan Pons. Patterson Hammersmith, [place]  12/22/2015 8:36 AM  New rx sent to pharmacy with correct instructions.

## 2015-12-22 NOTE — Patient Instructions (Signed)
Return for a a follow up appointment in 1 month  Your blood pressure today is 158/92   Check your blood pressure at home daily (if able) and keep record of the readings.  Take your BP meds as follows: Increase your Bystolic 5mg  to 10mg  daily (you may take 2 of the 5mg  tablets until your current supply runs out)  Bring all of your meds, your BP cuff and your record of home blood pressures to your next appointment.  Exercise as you're able, try to walk approximately 30 minutes per day.  Keep salt intake to a minimum, especially watch canned and prepared boxed foods.  Eat more fresh fruits and vegetables and fewer canned items.  Avoid eating in fast food restaurants.    HOW TO TAKE YOUR BLOOD PRESSURE: . Rest 5 minutes before taking your blood pressure. .  Don't smoke or drink caffeinated beverages for at least 30 minutes before. . Take your blood pressure before (not after) you eat. . Sit comfortably with your back supported and both feet on the floor (don't cross your legs). . Elevate your arm to heart level on a table or a desk. . Use the proper sized cuff. It should fit smoothly and snugly around your bare upper arm. There should be enough room to slip a fingertip under the cuff. The bottom edge of the cuff should be 1 inch above the crease of the elbow. . Ideally, take 3 measurements at one sitting and record the average.

## 2015-12-22 NOTE — Progress Notes (Signed)
Patient ID: EMMYTT REGGIO                 DOB: Sep 07, 1945                      MRN: [id number]     HPI: Tommy [name] is a 70 y.o. male patient of [facility] with PMH below who presents today for hypertension evaluation.  At his visit a few weeks ago he was started on Bystolic 5mg  daily. He reports being hesitant to try carvedilol or metoprolol due to side effect of fatigue.   He reports feeling well on Bystolic and states he has actually been able to do more since he started this medication.  We had a lengthy discussion about diet and exercise. He reports that his motivation is just low right now but that he will try to continue to work on improvements in his diet and trying to exercise occasionally.    Cardiac Hx: Htn, OSA on CPAP, morbid obesity  Current HTN meds:  Amlodipine 10mg  daily nebivolol 5mg  daily Valsartan HCT 320/25mg  daily   BP goal: <140/90  Family History: Father passed from congestive heart failure reportedly secondary to welding work; he was 70 yo when he passed. His mother passed from dementia at age 21.   Social History: smoked 1 pack of cigarettes once in his life and got extremely sick and has never smoked since (that was back when he was in high school). He denies alcohol consumption.   Diet: He eats most of his meals out, but denies eating fast food frequently. He eats fries and fried fish frequently. He does not add salt to his food as a general rule. He rarely consumes coffee. He endorses at least 1 bottle of pepsi a day as well as tea frequently.   Exercise: He reports that he rarely exercises though this week he did walk in the pool 2 times. He states he would like to do this more and I have asked him to make this a priority.   Home BP readings: in the last month since starting bystolic 12 readings 0000000 2 readings [id number] Diastolic all WNL HR lo-mid 60s  Wt Readings from Last 3 Encounters:  12/07/15 (!) 374 lb 3.2 oz (169.7 kg)  10/14/15  (!) 372 lb 3.2 oz (168.8 kg)  10/06/15 (!) 372 lb 9.6 oz (169 kg)   BP Readings from Last 3 Encounters:  12/07/15 (!) 156/96  11/03/15 (!) 189/109  10/14/15 (!) 146/86   Pulse Readings from Last 3 Encounters:  12/07/15 84  11/03/15 85  10/14/15 86    Renal function: CrCl cannot be calculated (Unknown ideal weight.).  Past Medical History:  Diagnosis Date  . Allergy   . Anemia    "as teenager"  . Arthritis    hands  . Back pain   . Cancer ([place])    "melanoma removed from leg years ago"  . Chronic back pain   . Complication of anesthesia    woke up and was confused and combative after back surgery  . Diabetes mellitus without complication ([place])   . Essential hypertension 10/16/2015  . GERD (gastroesophageal reflux disease)    diet controlled, no meds currently  . H/O hiatal hernia   . Hypertension    sees Dr. Virgina Jock  . Nocturia more than twice per night 12/21/2013  . Obesity hypoventilation syndrome ([place]) 04/07/2014  . Obesity, morbid ([place]) 12/21/2013  . OSA on CPAP 04/07/2014  . Sleep  apnea    uses CPAP nightly  . Snoring 12/21/2013  . Ulcer    stomach ulcer "as teenager"    Current Outpatient Prescriptions on File Prior to Visit  Medication Sig Dispense Refill  . amLODipine (NORVASC) 10 MG tablet Take 10 mg by mouth every morning.     Marland Kitchen aspirin 81 MG tablet Take 81 mg by mouth daily.    Marland Kitchen FIBER PO Take 2 tablets by mouth every morning.    . Glucosamine-Chondroitin 750-600 MG TABS Take 2 tablets by mouth every morning.    Marland Kitchen ipratropium (ATROVENT) 0.03 % nasal spray Place 1 spray into the nose 2 (two) times daily as needed for rhinitis. As needed    . metFORMIN (GLUCOPHAGE) 500 MG tablet Take 500 mg by mouth daily with breakfast.     . mometasone (NASONEX) 50 MCG/ACT nasal spray Place 2 sprays into the nose daily.    . montelukast (SINGULAIR) 10 MG tablet Take 10 mg by mouth every morning.    . Multiple Vitamin (MULTIVITAMIN) tablet Take 1 tablet by mouth daily.    .  naproxen (NAPROSYN) 500 MG tablet Take 500 mg by mouth 2 (two) times daily with a meal. As needed    . nebivolol (BYSTOLIC) 5 MG tablet Take 1 tablet (5 mg total) by mouth daily. 90 tablet 3  . valsartan-hydrochlorothiazide (DIOVAN-HCT) 320-25 MG per tablet Take 1 tablet by mouth every morning.     No current facility-administered medications on file prior to visit.     Allergies  Allergen Reactions  . Micardis [Telmisartan] Hives    Rash/hives     There were no vitals taken for this visit.   Assessment/Plan: Hypertension: BP today is not at goal. Increase Bystolic to 10mg  daily. Follow up in hypertension clinic in 4 weeks.    Thank you, Lelan Pons. Patterson Hammersmith, [facility]  12/22/2015 8:36 AM

## 2015-12-22 NOTE — Telephone Encounter (Signed)
New message        Pt c/o medication issue:  1. Name of Medication: Diltaiazem  2. How are you currently taking this medication (dosage and times per day)? 10 mg po 1/2 daily  3. Are you having a reaction (difficulty breathing--STAT)? no  4. What is your medication issue? Per pharmacy the 10 mg will not half well, the pharmacy is wanting to understand why the office sent another prescription today, the pt just picked up a prescription the other day

## 2016-01-19 ENCOUNTER — Ambulatory Visit (INDEPENDENT_AMBULATORY_CARE_PROVIDER_SITE_OTHER): Payer: Medicare Other | Admitting: Pharmacist

## 2016-01-19 ENCOUNTER — Encounter: Payer: Self-pay | Admitting: Pharmacist

## 2016-01-19 VITALS — BP 148/94 | HR 60

## 2016-01-19 DIAGNOSIS — I10 Essential (primary) hypertension: Secondary | ICD-10-CM | POA: Diagnosis not present

## 2016-01-19 MED ORDER — NEBIVOLOL HCL 20 MG PO TABS: 20.0000 mg | ORAL_TABLET | Freq: Every day | ORAL | 1 refills | Status: DC

## 2016-01-19 NOTE — Patient Instructions (Addendum)
Return for a follow up appointment in 1 month  Check your blood pressure at home daily (if able) and keep record of the readings.  Take your BP meds as follows: Increase Bystolic to 20mg  daily (You may use your current supply to make this dose).   Bring all of your meds, your BP cuff and your record of home blood pressures to your next appointment.  Exercise as you're able, try to walk approximately 30 minutes per day.  Keep salt intake to a minimum, especially watch canned and prepared boxed foods.  Eat more fresh fruits and vegetables and fewer canned items.  Avoid eating in fast food restaurants.    HOW TO TAKE YOUR BLOOD PRESSURE: . Rest 5 minutes before taking your blood pressure. .  Don't smoke or drink caffeinated beverages for at least 30 minutes before. . Take your blood pressure before (not after) you eat. . Sit comfortably with your back supported and both feet on the floor (don't cross your legs). . Elevate your arm to heart level on a table or a desk. . Use the proper sized cuff. It should fit smoothly and snugly around your bare upper arm. There should be enough room to slip a fingertip under the cuff. The bottom edge of the cuff should be 1 inch above the crease of the elbow. . Ideally, take 3 measurements at one sitting and record the average.

## 2016-01-19 NOTE — Progress Notes (Signed)
Patient ID: MAKOA SARRA                 DOB: Oct 10, 1945                      MRN: [id number]     HPI: Tommy [name] is a 70 y.o. male patient of Dr. Oval Linsey with PMH below who presents today for hypertension follow up. At our last visit his Bystolic was increased to 10mg  daily. He previously was very hesitant to try carvedilol or metoprolol due to the fatigue associated with them.   He reports doing well on the higher dose. He states he has seen an improvement in his blood pressure though not all of them are at goal. He also questions whether the Bystolic is causing his GI discomfort. He states he believes it is likely his metformin but wanted to ask if Bystolic was frequently associated with GI discomfort.   He reports he has frequently taken his medications late but never missed doses completely. He also states he has been drinking more coffee 1-2 cups each morning.    Cardiac Hx: HTN, OSA on CPAP, morbid obesity  Current HTN meds:  Amlodipine 10mg  daily Bystolic 10mg  daily Valsartan HCT 320/25 daily  BP goal: <140/90  Family History: Father passed from congestive heart failure reportedly secondary to welding work; he was 70 yo when he passed. His mother passed from dementia at age 41.   Social History: smoked 1 pack of cigarettes once in his life and got extremely sick and has never smoked since (that was back when he was in high school). He denies alcohol consumption.   Diet: He eats most of his meals out, but denies eating fast food frequently. He eats fries and fried fish frequently. He does not add salt to his food as a general rule. He rarely consumes coffee. He endorses at least 1 bottle of pepsi a day as well as tea frequently.   Exercise: He reports that he rarely exercises though this week he did walk in the pool 2 times. He states he would like to do this more and I have asked him to make this a priority.   Home BP readings:  Overall his pressure are  mostly in the 130s/70s, but several times a week he increases to 140s and he has had 3 over the last few weeks that were >150.   HR ranges from high 50s to low 70s on log.   Wt Readings from Last 3 Encounters:  12/22/15 (!) 370 lb 9.6 oz (168.1 kg)  12/07/15 (!) 374 lb 3.2 oz (169.7 kg)  10/14/15 (!) 372 lb 3.2 oz (168.8 kg)   BP Readings from Last 3 Encounters:  01/19/16 (!) 148/94  12/22/15 (!) 158/92  12/07/15 (!) 156/96   Pulse Readings from Last 3 Encounters:  01/19/16 60  12/22/15 (!) 57  12/07/15 84    Renal function: CrCl cannot be calculated (Unknown ideal weight.).  Past Medical History:  Diagnosis Date  . Allergy   . Anemia    "as teenager"  . Arthritis    hands  . Back pain   . Cancer ([place])    "melanoma removed from leg years ago"  . Chronic back pain   . Complication of anesthesia    woke up and was confused and combative after back surgery  . Diabetes mellitus without complication ([place])   . Essential hypertension 10/16/2015  . GERD (gastroesophageal reflux disease)  diet controlled, no meds currently  . H/O hiatal hernia   . Hypertension    sees Dr. Virgina Jock  . Nocturia more than twice per night 12/21/2013  . Obesity hypoventilation syndrome ([place]) 04/07/2014  . Obesity, morbid ([place]) 12/21/2013  . OSA on CPAP 04/07/2014  . Sleep apnea    uses CPAP nightly  . Snoring 12/21/2013  . Ulcer    stomach ulcer "as teenager"    Current Outpatient Prescriptions on File Prior to Visit  Medication Sig Dispense Refill  . amLODipine (NORVASC) 10 MG tablet Take 10 mg by mouth every morning.     Marland Kitchen aspirin 81 MG tablet Take 81 mg by mouth daily.    Marland Kitchen FIBER PO Take 2 tablets by mouth every morning.    . Glucosamine-Chondroitin 750-600 MG TABS Take 2 tablets by mouth every morning.    Marland Kitchen ipratropium (ATROVENT) 0.03 % nasal spray Place 1 spray into the nose 2 (two) times daily as needed for rhinitis. As needed    . metFORMIN (GLUCOPHAGE) 500 MG tablet Take 500 mg by mouth  daily with breakfast.     . mometasone (NASONEX) 50 MCG/ACT nasal spray Place 2 sprays into the nose daily.    . montelukast (SINGULAIR) 10 MG tablet Take 10 mg by mouth every morning.    . Multiple Vitamin (MULTIVITAMIN) tablet Take 1 tablet by mouth daily.    . naproxen (NAPROSYN) 500 MG tablet Take 500 mg by mouth 2 (two) times daily with a meal. As needed    . valsartan-hydrochlorothiazide (DIOVAN-HCT) 320-25 MG per tablet Take 1 tablet by mouth every morning.     No current facility-administered medications on file prior to visit.     Allergies  Allergen Reactions  . Micardis [Telmisartan] Hives    Rash/hives     Blood pressure (!) 148/94, pulse 60.   Assessment/Plan: Hypertension: BP is not at goal today. Increase Bystolic to 20mg  daily. More likely that metformin is causing GI discomfort than Bystolic, but he will monitor progress with this with increased dose. Continue to monitor pressures/HR at home. Bring log to follow up visit in 4-6 weeks.    Thank you, Lelan Pons. Patterson Hammersmith, [facility]  01/19/2016 2:33 PM

## 2016-02-22 ENCOUNTER — Ambulatory Visit (INDEPENDENT_AMBULATORY_CARE_PROVIDER_SITE_OTHER): Payer: Medicare Other | Admitting: Pharmacist

## 2016-02-22 VITALS — BP 146/88 | HR 58

## 2016-02-22 DIAGNOSIS — I10 Essential (primary) hypertension: Secondary | ICD-10-CM | POA: Diagnosis not present

## 2016-02-22 NOTE — Patient Instructions (Signed)
Return for a follow up appointment next year with Dr. Oval Linsey  Check your blood pressure at home daily (if able) and keep record of the readings.  Take your BP meds as follows: Continue same medications as prescribed  Bring all of your meds, your BP cuff and your record of home blood pressures to your next appointment.  Exercise as you're able, try to walk approximately 30 minutes per day.  Keep salt intake to a minimum, especially watch canned and prepared boxed foods.  Eat more fresh fruits and vegetables and fewer canned items.  Avoid eating in fast food restaurants.    HOW TO TAKE YOUR BLOOD PRESSURE: . Rest 5 minutes before taking your blood pressure. .  Don't smoke or drink caffeinated beverages for at least 30 minutes before. . Take your blood pressure before (not after) you eat. . Sit comfortably with your back supported and both feet on the floor (don't cross your legs). . Elevate your arm to heart level on a table or a desk. . Use the proper sized cuff. It should fit smoothly and snugly around your bare upper arm. There should be enough room to slip a fingertip under the cuff. The bottom edge of the cuff should be 1 inch above the crease of the elbow. . Ideally, take 3 measurements at one sitting and record the average.

## 2016-02-22 NOTE — Progress Notes (Signed)
Patient ID: Tommy [name]                 DOB: 1945/09/26                      MRN: [id number]     HPI: Tommy [name] is a 70 y.o. male patient of Dr. Oval Linsey with PMH below who presents today for hypertension follow up. At his most recent visit his bystolic was increased to 20mg  daily. He previously was very hesitant to try carvedilol or metoprolol due to the fatigue associated with them.   He reports doing very well on this higher dose of Bystolic.  He states he is extremely pleased with the progress on his pressure with the higher dose. He states he will try to work on diet and weight since he knows that is ultimately what is needed to keep his pressures controlled.    Cardiac Hx: HTN, OSA on CPAP, morbid obesity  Current HTN meds:  Amlodipine 10mg  daily Bystolic 20mg  daily Valsartan HCT 320/25 daily  BP goal: <140/90  Family History: Father passed from congestive heart failure reportedly secondary to welding work; he was 70 yo when he passed. His mother passed from dementia at age 54.   Social History: smoked 1 pack of cigarettes once in his life and got extremely sick and has never smoked since (that was back when he was in high school). He denies alcohol consumption.   Diet:He eats most of his meals out, but denies eating fast food frequently. He eats fries and fried fish frequently. He does not add salt to his food as a general rule. He rarely consumes coffee. He endorses at least 1 bottle of pepsi a day as well as tea frequently.   Exercise:He reports that he rarely exercises though this week he did walk in the pool 2 times. He states he would like to do this more and I have asked him to make this a priority.   Home BP readings:  Homes readings reveal only 1-2 measurements above 140/90 for which he was either in pain and using NSAIDs or very active just prior. Most of his measurements fall 120-130s/70-80s.    Wt Readings from Last 3 Encounters:  12/22/15  (!) 370 lb 9.6 oz (168.1 kg)  12/07/15 (!) 374 lb 3.2 oz (169.7 kg)  10/14/15 (!) 372 lb 3.2 oz (168.8 kg)   BP Readings from Last 3 Encounters:  02/22/16 (!) 146/88  01/19/16 (!) 148/94  12/22/15 (!) 158/92   Pulse Readings from Last 3 Encounters:  02/22/16 (!) 58  01/19/16 60  12/22/15 (!) 57    Renal function: CrCl cannot be calculated (Unknown ideal weight.).  Past Medical History:  Diagnosis Date  . Allergy   . Anemia    "as teenager"  . Arthritis    hands  . Back pain   . Cancer ([place])    "melanoma removed from leg years ago"  . Chronic back pain   . Complication of anesthesia    woke up and was confused and combative after back surgery  . Diabetes mellitus without complication ([place])   . Essential hypertension 10/16/2015  . GERD (gastroesophageal reflux disease)    diet controlled, no meds currently  . H/O hiatal hernia   . Hypertension    sees Dr. Virgina Jock  . Nocturia more than twice per night 12/21/2013  . Obesity hypoventilation syndrome ([place]) 04/07/2014  . Obesity, morbid ([place]) 12/21/2013  . OSA  on CPAP 04/07/2014  . Sleep apnea    uses CPAP nightly  . Snoring 12/21/2013  . Ulcer ([place])    stomach ulcer "as teenager"    Current Outpatient Prescriptions on File Prior to Visit  Medication Sig Dispense Refill  . amLODipine (NORVASC) 10 MG tablet Take 10 mg by mouth every morning.     Marland Kitchen aspirin 81 MG tablet Take 81 mg by mouth daily.    Marland Kitchen FIBER PO Take 2 tablets by mouth every morning.    . Glucosamine-Chondroitin 750-600 MG TABS Take 2 tablets by mouth every morning.    Marland Kitchen ipratropium (ATROVENT) 0.03 % nasal spray Place 1 spray into the nose 2 (two) times daily as needed for rhinitis. As needed    . metFORMIN (GLUCOPHAGE) 500 MG tablet Take 500 mg by mouth daily with breakfast.     . mometasone (NASONEX) 50 MCG/ACT nasal spray Place 2 sprays into the nose daily.    . montelukast (SINGULAIR) 10 MG tablet Take 10 mg by mouth every morning.    . Multiple Vitamin  (MULTIVITAMIN) tablet Take 1 tablet by mouth daily.    . naproxen (NAPROSYN) 500 MG tablet Take 500 mg by mouth 2 (two) times daily with a meal. As needed    . nebivolol 20 MG TABS Take 1 tablet (20 mg total) by mouth daily. 90 tablet 1  . valsartan-hydrochlorothiazide (DIOVAN-HCT) 320-25 MG per tablet Take 1 tablet by mouth every morning.     No current facility-administered medications on file prior to visit.     Allergies  Allergen Reactions  . Micardis [Telmisartan] Hives    Rash/hives     Blood pressure (!) 146/88, pulse (!) 58.   Assessment/Plan: Hypertension: BP today is elevated, but home pressures are controlled. No medication changes today. Follow up with Dr. Oval Linsey as scheduled and hypertension clinic as needed.    Thank you, Lelan Pons. Patterson Hammersmith, [place]

## 2016-09-06 ENCOUNTER — Other Ambulatory Visit: Payer: Self-pay | Admitting: Cardiovascular Disease

## 2016-09-06 NOTE — Telephone Encounter (Signed)
REFILL 

## 2016-09-06 NOTE — Telephone Encounter (Signed)
Refill Request.  

## 2016-10-10 ENCOUNTER — Encounter: Payer: Self-pay | Admitting: Neurology

## 2016-10-10 ENCOUNTER — Telehealth: Payer: Self-pay

## 2016-10-10 ENCOUNTER — Encounter (INDEPENDENT_AMBULATORY_CARE_PROVIDER_SITE_OTHER): Payer: Self-pay

## 2016-10-10 ENCOUNTER — Ambulatory Visit (INDEPENDENT_AMBULATORY_CARE_PROVIDER_SITE_OTHER): Payer: Medicare Other | Admitting: Neurology

## 2016-10-10 VITALS — BP 129/81 | HR 63 | Ht 72.0 in | Wt 353.0 lb

## 2016-10-10 DIAGNOSIS — J449 Chronic obstructive pulmonary disease, unspecified: Secondary | ICD-10-CM

## 2016-10-10 DIAGNOSIS — G4734 Idiopathic sleep related nonobstructive alveolar hypoventilation: Secondary | ICD-10-CM

## 2016-10-10 DIAGNOSIS — Z9989 Dependence on other enabling machines and devices: Secondary | ICD-10-CM

## 2016-10-10 DIAGNOSIS — G4736 Sleep related hypoventilation in conditions classified elsewhere: Secondary | ICD-10-CM | POA: Diagnosis not present

## 2016-10-10 DIAGNOSIS — I10 Essential (primary) hypertension: Secondary | ICD-10-CM

## 2016-10-10 DIAGNOSIS — G4733 Obstructive sleep apnea (adult) (pediatric): Secondary | ICD-10-CM

## 2016-10-10 DIAGNOSIS — Z6841 Body Mass Index (BMI) 40.0 and over, adult: Secondary | ICD-10-CM | POA: Diagnosis not present

## 2016-10-10 NOTE — Telephone Encounter (Signed)
-----   Message from Patsy Baltimore sent at 10/10/2016 11:13 AM EDT ----- Vickie Epley, I have sent in order to perform ONO on CPAP and send the results to Dr. Brett Fairy.   I just wanted to make yall aware that unfortunately these results would not qualify pt through [facility] insurance for nocturnal O2 if that is what Dr. Brett Fairy determines is needed.   I can give more detail if needed. Just wanted you all to be aware. Thanks.   Angie   ----- Message ----- From: Lester [place], RN Sent: 10/10/2016  10:23 AM To: Patsy Baltimore  New order in!

## 2016-10-10 NOTE — Patient Instructions (Signed)
Hypersomnia Hypersomnia is when you feel extremely tired during the day even though you're getting plenty of sleep at night. You may need to take naps during the day, and you may also be extremely difficult to wake up when you are sleeping. What are the causes? The cause of your hypersomnia may not be known. Hypersomnia may be caused by:  Medicines.  Sleep disorders, such as narcolepsy.  Trauma or injury to your head or nervous system.  Using drugs or alcohol.  Tumors.  Medical conditions, such as depression or hypothyroidism.  Genetics. What are the signs or symptoms? The main symptoms of hypersomnia include:  Feeling extremely tired throughout the day.  Being very difficult to wake up.  Sleeping for longer and longer periods.  Taking naps throughout the day. Other symptoms may include:  Feeling:  Restless.  Annoyed.  Anxious.  Low energy.  Having difficulty:  Remembering.  Speaking.  Thinking.  Losing your appetite.  Experiencing hallucinations. How is this diagnosed? Hypersomnia may be diagnosed by:  Medical history and physical exam. This will include a sleep history.  Completing sleep logs.  Tests may also be done, such as:  Polysomnography.  Multiple sleep latency test (MSLT). How is this treated? There is no cure for hypersomnia, but treatment can be very effective in helping manage the condition. Treatment may include:  Lifestyle and sleeping strategies to help cope with the condition.  Stimulant medicines.  Treating any underlying causes of hypersomnia. Follow these instructions at home:  Take medicines only as directed by your health care provider.  Schedule short naps for when you feel sleepiest during the day. Tell your employer or teachers that you have hypersomnia. You may be able to adjust your schedule to include time for naps.  Avoid drinking alcohol or caffeinated beverages.  Do not eat a heavy meal before bedtime. Eat  at about the same times every day.  Do not drive or operate heavy machinery if you are sleepy.  Do not swim or go out on the water without a life jacket.  If possible, adjust your schedule so that you do not have to work or be active at night.  Keep all follow-up visits as directed by your health care provider. This is important. Contact a health care provider if:  You have new symptoms.  Your symptoms get worse. Get help right away if: You have serious thoughts of hurting yourself or someone else. This information is not intended to replace advice given to you by your health care provider. Make sure you discuss any questions you have with your health care provider. Document Released: 04/27/2002 Document Revised: 10/13/2015 Document Reviewed: 12/10/2013 Elsevier Interactive Patient Education  2017 [employer].

## 2016-10-10 NOTE — Progress Notes (Signed)
[facility]  PATIENT: Tommy [name] [name] DOB: 12-May-1946   REASON FOR VISIT: Follow-up for obstructive sleep apnea with CPAP, morbid obesity HISTORY FROM: Patient  Interval history from 10/10/2016 area at the pleasure of seeing Tommy [name] [name] today, who is diabetic, hypertensive and was diagnosed with obstructive sleep apnea in 2015. His diabetes is currently treated with interval,, this causes some nocturia. He is compliant with his CPAP use, uses an AutoSet between 10 and 16 cm water with 3 cm EPR achieving a residual AHI of 1.2 apneas per hour. His 100% compliant by days and hours of use was an average user time of 6 hours and 50 minutes. He is using an air sense machine by [employer] and nasal pillows. No adjustments have to be made on this side. He still uses CPAP without ramp function and with a high starting pressure. I will repeat an ONO to see if he still needs oxygen. He likes to travel and oxygen need complicates things. He has hip pain, awaiting a consultation with Dr. Maureen [name].   His wife is on CPAP, too.    HISTORY OF PRESENT ILLNESS: Tommy [name] [name] is a 70 y.o.caucasian , right handed male , who is seen here as a referral from Dr. Virgina [name] for sleep evaluation.  The patient Is a retired Designer, jewellery, Music therapist. He has sleep problems of nocturia, snoring and GERD in sleep, EDS . He also has a history of osteoarthritis allergic rhinitis, GERD, hypertension, fatty liver disease. He has a family history of heart disease or hypertension. Tommy [name] [name] reports that she had back surgery ( laminectomy in level L4-L5 ) and had reported to his orthopedists that he was excessively daytime sleepy and had trouble staying awake when driving longer distances. Tommy [name] [name] asked him to make his primary care physician aware of this problem.  Prior to his back surgery in 2014 the patient was used to sleep in a recliner mainly because he could not find a comfortable  position in bed. Since his back surgery this has improved tremendously and he is now sleeping in a regular bed. He has aches and pain when exercising, gained weight even after back surgery, he gained 60 pounds before surgery in only 3 years. He is frequently short of breath when just speaking, not exercising.He has a sister with heart disease, a brother with obstructive sleep apnea , mother but was diagnosed as obstructive sleep apnea and his father is deceased with congestive heart failure.   Interval history 04-07-14 CD Tommy [name] [name] underwent a split night polysomnography on 02-01-14 after endorsing the Epworth Sleepiness Scale at 16 points and presenting with a neck circumference of 20 inches and a BMI of 48.5. The patient's AHI was 43.1 he did not have any REM sleep, he did not sleep supine for these 2 hours of diagnostic study - his CO2 increased to 54.44. His oxygen nadir was 84% for 43.1 minutes the patient was titrated and interestingly his oxygen saturations actually decreased further 164 minutes of desaturations were found during the CPAP titration with a nadir at 71% [name]. And he had nocturia several times at night. He was fitted with an O2 pressure window between 10 and 16 cm water. We also recommended possible oxygen therapy should his hypoxemia continue on CPAP. Today is the patient's first visit after sleep study and he has noted some remarkable differences for example his Epworth Sleepiness Scale is now 10 and his fatigue  severity score 41 points. The first 30 days of use he struggled with his CPAP and mostly slept in a recliner. He now has adjusted quite well at 30 day download dated 04-05-16 chills 5 hours and 33 minutes of daily use 100% compliance of full-time EPR level of 3 cm water and a pressure of 12.9 cm water at the 91st percentile. His residual AHI is 1.3. There were no Cheyne-Stokes respirations noted. An adjustment of settings is not  necessary but the patient wishes to not have a ramp function that. He has likely to use some additional oxygen. ONO showed on CPAP low oxygenation. He has taken off the RAMP function and reduced the humidity.  He has not fallen asleep in church since being on CPAP.    REVIEW OF SYSTEMS: Full 14 system review of systems performed and notable only for those listed, all others are neg:   Falling asleep in church.  Often sleeps in a recliner, or on his right side - due to arthritis. DM with nocturia ( every 2 -3 hours)  and polyuria on INVOKANA.  No snoring on CPAP. 7 hours of sleep nightly, Reports hip and lower back pain.  He endorsed the geriatric depression score at 3 out of 15 points which is not indicative of depression, the fatigue severity score was endorsed at 33 points, the Epworth sleepiness score was endorsed at 7 points.  HOME MEDICATIONS: Outpatient Medications Prior to Visit  Medication Sig Dispense Refill  . amLODipine (NORVASC) 10 MG tablet Take 10 mg by mouth every morning.     Marland Kitchen aspirin 81 MG tablet Take 81 mg by mouth daily.    Marland Kitchen BYSTOLIC 20 MG TABS TAKE 1 TABLET (20 MG TOTAL) BY MOUTH DAILY. 90 tablet 0  . FIBER PO Take 2 tablets by mouth every morning.    . Glucosamine-Chondroitin 750-600 MG TABS Take 2 tablets by mouth every morning.    Marland Kitchen ipratropium (ATROVENT) 0.03 % nasal spray Place 1 spray into the nose 2 (two) times daily as needed for rhinitis. As needed    . metFORMIN (GLUCOPHAGE) 500 MG tablet Take 500 mg by mouth daily with breakfast.     . mometasone (NASONEX) 50 MCG/ACT nasal spray Place 2 sprays into the nose daily.    . montelukast (SINGULAIR) 10 MG tablet Take 10 mg by mouth every morning.    . Multiple Vitamin (MULTIVITAMIN) tablet Take 1 tablet by mouth daily.    . naproxen (NAPROSYN) 500 MG tablet Take 500 mg by mouth 2 (two) times daily with a meal. As needed    . valsartan-hydrochlorothiazide (DIOVAN-HCT) 320-25 MG per tablet Take 1 tablet by mouth  every morning.     No facility-administered medications prior to visit.     PAST MEDICAL HISTORY: Past Medical History:  Diagnosis Date  . Allergy   . Anemia    "as teenager"  . Arthritis    hands  . Back pain   . Cancer ([place])    "melanoma removed from leg years ago"  . Chronic back pain   . Complication of anesthesia    woke up and was confused and combative after back surgery  . Diabetes mellitus without complication ([place])   . Essential hypertension 10/16/2015  . GERD (gastroesophageal reflux disease)    diet controlled, no meds currently  . H/O hiatal hernia   . Hypertension    sees Dr. Virgina [name]  . Nocturia more than twice per night 12/21/2013  . Obesity hypoventilation syndrome (  [place]) 04/07/2014  . Obesity, morbid ([place]) 12/21/2013  . OSA on CPAP 04/07/2014  . Sleep apnea    uses CPAP nightly  . Snoring 12/21/2013  . Ulcer    stomach ulcer "as teenager"    PAST SURGICAL HISTORY: Past Surgical History:  Procedure Laterality Date  . BACK SURGERY     "done approx. 24 years ago"  . CARDIOVASCULAR STRESS TEST     "done approx 10 years ago, Dr. Virgina [name]"  . COLONOSCOPY  2006   hx of "with removal of polyps"/[facility]  . COLONOSCOPY WITH PROPOFOL N/A 09/06/2015   Procedure: COLONOSCOPY WITH PROPOFOL;  Surgeon: Ladene Artist, MD;  Location: [facility];  Service: Endoscopy;  Laterality: N/A;  . lipoma removed     from leg  . LUMBAR LAMINECTOMY/DECOMPRESSION MICRODISCECTOMY  06/19/2012   Procedure: LUMBAR LAMINECTOMY/DECOMPRESSION MICRODISCECTOMY 1 LEVEL;  Surgeon: Melina Schools, MD;  Location: [facility];  Service: Orthopedics;  Laterality: Left;  L2-3 LEFT MICRODISCECTOMY  . TONSILLECTOMY    . WISDOM TOOTH EXTRACTION      FAMILY HISTORY: Family History  Problem Relation Age of Onset  . Dementia Mother   . Colon polyps Mother   . Heart failure Father   . Breast cancer Sister   . Heart attack Sister   . Thyroid disease Sister   . Colon cancer Neg Hx   . Esophageal cancer Neg Hx    . Rectal cancer Neg Hx   . Stomach cancer Neg Hx     SOCIAL HISTORY: Social History   Social History  . Marital status: Married    Spouse name: [name]  . Number of children: 2  . Years of education: Masters   Occupational History  .  Retired   Social History Main Topics  . Smoking status: Never Smoker  . Smokeless tobacco: Never Used  . Alcohol use No  . Drug use: No  . Sexual activity: Not on file   Other Topics Concern  . Not on file   Social History Narrative   Patient is married [name]) and lives at home with his wife.   Patient has two adult children.   Patient is retired.   Patient has a Master's degree.   Patient is right-handed   Patient does not drink any caffeine.     PHYSICAL EXAM  Vitals:   10/10/16 0943  BP: 129/81  Pulse: 63  Weight: (!) 353 lb (160.1 kg)  Height: 6' (1.829 m)   Body mass index is 47.88 kg/m. General: The patient is awake, alert and appears not in acute distress. The patient is well groomed. Head: Normocephalic, atraumatic.  Neck is supple. Mallampati 3 , neck circumference: 19  Cardiovascular: Regular rate and rhythm , without murmurs  Respiratory: Lungs are clear to auscultation.  Skin: Without evidence of edema, or rash   Neurologic exam : The patient is awake and alert, oriented to place and time. Memory subjective described as intact. There is a normal attention span & concentration ability. Speech is fluent without dysarthria, dysphonia or aphasia. Mood and affect are appropriate.   Cranial nerves:Pupils are equal and briskly reactive to light.  Visual fields by finger perimetry are intact.Hearing to finger rub intact. Facial sensation intact to fine touch. Facial motor strength is symmetric and tongue and uvula move midline. Motor full strength in the upper and lower extremities Gait and station: Patient walks without assistive device . Wide based.  Deep tendon reflexes: in the upper and lower  extremities are symmetric  and intact.    DIAGNOSTIC DATA (LABS, IMAGING, TESTING) -  ASSESSMENT AND PLAN 71 y.o. year old male has a past medical history of Hypertension; ; Anemia; Ulcer; Chronic back pain; Obesity, morbid (12/21/2013); Snoring (12/21/2013); Nocturia more than twice per night (12/21/2013); Back pain; Obesity hypoventilation syndrome (04/07/2014); and OSA on CPAP (04/07/2014). here to follow-up. He is 100% compliant with his download 6 hours and 49 minutes. AHI 2.1  Excellent compliance with CPAP continue same settings  Follow-up yearly . Stay well hydrated by drinking water 8 to 10  glasses a day    [facility] [address] [place], [place] [address] [phone]

## 2016-10-17 NOTE — Telephone Encounter (Signed)
-----   Message from Patsy Baltimore sent at 10/10/2016 11:13 AM EDT ----- Vickie Epley, I have sent in order to perform ONO on CPAP and send the results to Dr. Brett Fairy.   I just wanted to make yall aware that unfortunately these results would not qualify pt through [facility] insurance for nocturnal O2 if that is what Dr. Brett Fairy determines is needed.    I can give more detail if needed. Just wanted you all to be aware. Thanks.   Angie   ----- Message ----- From: Lester Osmond, RN Sent: 10/10/2016  10:23 AM To: Patsy Baltimore    He just wanted to verify if the patient needs oxygen or not, I am not sad on prescribing oxygen necessarily and the results of the pulse oximetry help in making this decision. Thank you for your effort. C Chrishawn Boley.

## 2016-10-24 ENCOUNTER — Telehealth: Payer: Self-pay | Admitting: Neurology

## 2016-10-24 DIAGNOSIS — G4734 Idiopathic sleep related nonobstructive alveolar hypoventilation: Secondary | ICD-10-CM

## 2016-10-24 NOTE — Telephone Encounter (Addendum)
Tommy [name] ONO from 10-10-2016  documented 1 hour and 35 minutes of total desaturation time at or below 89%  and 36 minutes of desaturation time at or below 88% Sp02.  He does not qualify for Oxygen because the longest consecutive time in desaturation was shorter than 4 minutes.  I still would consider him hypoxemic.  I called him and offered to repeat the ONO, he agreed and I put in order in.  [facility]- he used to see Jonni Sanger.   CD

## 2016-10-24 NOTE — Telephone Encounter (Signed)
Received ONO on CPAP results from [facility] Please review results and advise [place] on what to discuss with the pt regarding this results.

## 2016-10-24 NOTE — Telephone Encounter (Signed)
I have spoken to Mr Tommy [name] and he is willing to repeat the ONO , he will be at the [facility] for the next 14 days and calls [place] when he is back.  I would place the order now, but ask the ONO to be perfomed in late June.  Jeb Levering, MD

## 2016-10-25 NOTE — Telephone Encounter (Signed)
Order for ONO sent to [facility].

## 2016-10-25 NOTE — Telephone Encounter (Signed)
Order sent to [facility]

## 2016-11-19 ENCOUNTER — Telehealth: Payer: Self-pay | Admitting: Neurology

## 2016-11-19 NOTE — Telephone Encounter (Signed)
Please inform patient that he will not need oxygen.   Overnight pulse oximetry through [facility]  from 11/06/2016 revealed a nadir of 79% SPO2, the longest consecutive time of hypoxemia was 3.9 minutes. The patient would not qualify for oxygen supplementation. Signed on report 11-14-2016 , Asencion Partridge Brandie Lopes.

## 2016-11-20 NOTE — Telephone Encounter (Signed)
Called pt with results on the ONO. Explained that pt would not require any additional oxygen. Expressed the need for continuing to be compliant with his CPAP which he has been. Pt verbalized understanding

## 2016-12-03 ENCOUNTER — Ambulatory Visit (INDEPENDENT_AMBULATORY_CARE_PROVIDER_SITE_OTHER): Payer: Medicare Other | Admitting: Cardiovascular Disease

## 2016-12-03 ENCOUNTER — Encounter: Payer: Self-pay | Admitting: Cardiovascular Disease

## 2016-12-03 VITALS — BP 140/90 | HR 69 | Ht 72.0 in | Wt 356.0 lb

## 2016-12-03 DIAGNOSIS — I10 Essential (primary) hypertension: Secondary | ICD-10-CM | POA: Diagnosis not present

## 2016-12-03 DIAGNOSIS — E7800 Pure hypercholesterolemia, unspecified: Secondary | ICD-10-CM

## 2016-12-03 NOTE — Patient Instructions (Signed)

## 2016-12-03 NOTE — Progress Notes (Signed)
Cardiology Office Note   Date:  12/03/2016   ID:  Tommy [name], DOB 1946-02-17, MRN [id number]  PCP:  Shon Baton, MD  Cardiologist:   Skeet Latch, MD   Chief Complaint  [name] presents with  . Follow-up      History of Present Illness: Tommy [name] is a 71 y.o. male with asthma, hypertension, GERD, fatty liver disease, OSA on CPAP and morbid obesity who presents for follow up on shortness of breath. Tommy [name] saw his PCP, Dr. Shon Baton, on 09/20/15. At that appointment it was noted that a chest x-ray in January 2014 showed a tortuous aorta and calcifications of Tommy thoracic aorta. He also reported dyspnea on exertion. He was referred to cardiology for further evaluation.  He was referred for a Lexiscan Myoview 10/2015 that was negative for ischemia but Tommy ejection fraction was reported as 30-44%. He subsequently had an echocardiogram that revealed normal systolic function and mild LVH.  Diastolic function was abnormal but Tommy degree of dysfunction was not commented upon and Tommy echo is not currently available for review.  Since his last appointment Tommy [name] has been doing well.  His main complaints today are orthopedic. He has pain in his back and bilateral hips. In Tommy past she liked to see him for exercise. Other lately this is been hurting his back. He's been in pain almost constantly for Tommy last 3 weeks and is unable to either sit down or standing for prolonged periods of time. He is working with Dr. Maureen Ralphs and may need hip replacement.  Injections are no longer helping.  He started taking prednisone yesterday.  At baseline blood pressure is around 120s over 70s. However, for Tommy last 3 weeks his blood pressure is been in Tommy 130s at home. He attributes this to pain.  When he is in pain reports feeling sorry for himself and his diet has been poor.    Past Medical History:  Diagnosis Date  . Allergy   . Anemia    "as teenager"  . Arthritis    hands    . Back pain   . Cancer ([place])    "melanoma removed from leg years ago"  . Chronic back pain   . Complication of anesthesia    woke up and was confused and combative after back surgery  . Diabetes mellitus without complication ([place])   . Essential hypertension 10/16/2015  . GERD (gastroesophageal reflux disease)    diet controlled, no meds currently  . H/O hiatal hernia   . Hypertension    sees Dr. Virgina Jock  . Nocturia more than twice per night 12/21/2013  . Obesity hypoventilation syndrome ([place]) 04/07/2014  . Obesity, morbid ([place]) 12/21/2013  . OSA on CPAP 04/07/2014  . Sleep apnea    uses CPAP nightly  . Snoring 12/21/2013  . Ulcer    stomach ulcer "as teenager"    Past Surgical History:  Procedure Laterality Date  . BACK SURGERY     "done approx. 24 years ago"  . CARDIOVASCULAR STRESS TEST     "done approx 10 years ago, Dr. Virgina Jock"  . COLONOSCOPY  2006   hx of "with removal of polyps"/[facility]  . COLONOSCOPY WITH PROPOFOL N/A 09/06/2015   Procedure: COLONOSCOPY WITH PROPOFOL;  Surgeon: Ladene Artist, MD;  [facility]: [facility];  Service: Endoscopy;  Laterality: N/A;  . lipoma removed     from leg  . LUMBAR LAMINECTOMY/DECOMPRESSION MICRODISCECTOMY  06/19/2012   Procedure: LUMBAR LAMINECTOMY/DECOMPRESSION MICRODISCECTOMY  1 LEVEL;  Surgeon: Melina Schools, MD;  Location: [place] [facility];  Service: Orthopedics;  Laterality: Left;  L2-3 LEFT MICRODISCECTOMY  . TONSILLECTOMY    . WISDOM TOOTH EXTRACTION       Current Outpatient Prescriptions  Medication Sig Dispense Refill  . amLODipine (NORVASC) 10 MG tablet Take 10 mg by mouth every morning.     Marland Kitchen aspirin 81 MG tablet Take 81 mg by mouth daily.    Marland Kitchen BYSTOLIC 20 MG TABS TAKE 1 TABLET (20 MG TOTAL) BY MOUTH DAILY. 90 tablet 0  . FIBER PO Take 2 tablets by mouth every morning.    . Glucosamine-Chondroitin 750-600 MG TABS Take 2 tablets by mouth every morning.    . INVOKANA 100 MG TABS tablet Take 100 mg by mouth daily.  3  . ipratropium  (ATROVENT) 0.03 % nasal spray Place 1 spray into Tommy nose 2 (two) times daily as needed for rhinitis. As needed    . metFORMIN (GLUCOPHAGE) 500 MG tablet Take 500 mg by mouth daily with breakfast.     . mometasone (NASONEX) 50 MCG/ACT nasal spray Place 2 sprays into Tommy nose daily.    . montelukast (SINGULAIR) 10 MG tablet Take 10 mg by mouth every morning.    . Multiple Vitamin (MULTIVITAMIN) tablet Take 1 tablet by mouth daily.    . naproxen (NAPROSYN) 500 MG tablet Take 500 mg by mouth as needed. As needed    . predniSONE (DELTASONE) 10 MG tablet Take 30 mg by mouth as needed.    . valsartan-hydrochlorothiazide (DIOVAN-HCT) 320-25 MG per tablet Take 1 tablet by mouth every morning.     No current facility-administered medications for this visit.     Allergies:   Micardis [telmisartan]    Social History:  Tommy [name]  reports that he has never smoked. He has never used smokeless tobacco. He reports that he does not drink alcohol or use drugs.   Family History:  Tommy [name]'s family history includes Breast cancer in his sister; Colon polyps in his mother; Dementia in his mother; Heart attack in his sister; Heart failure in his father; Thyroid disease in his sister.    ROS:  Please see Tommy history of present illness.   Otherwise, review of systems are positive for depression.   All other systems are reviewed and negative.    PHYSICAL EXAM: VS:  BP 140/90   Pulse 69   Ht 6' (1.829 m)   Wt (!) 161.5 kg (356 lb)   BMI 48.28 kg/m  , BMI Body mass index is 48.28 kg/m. GENERAL:  Well appearing.  Morbidly obese. HEENT:  Pupils equal round and reactive, fundi not visualized, oral mucosa unremarkable NECK:  No jugular venous distention, waveform within normal limits, carotid upstroke brisk and symmetric, no bruits LUNGS:  Clear to auscultation bilaterally.  No crackles, rhonchi, or wheezes.  HEART:  RRR.  PMI not displaced or sustained,S1 and S2 within normal limits, no S3, no S4, no  clicks, no rubs, no murmurs ABD:  Flat, positive bowel sounds normal in frequency in pitch, no bruits, no rebound, no guarding, no midline pulsatile mass, no hepatomegaly, no splenomegaly EXT:  2 plus pulses throughout, trace edema, no cyanosis no clubbing SKIN:  No rashes no nodules NEURO:  Cranial nerves II through XII grossly intact, motor grossly intact throughout PSYCH:  Cognitively intact, oriented to person place and time   EKG:  EKG is ordered today. Tommy ekg ordered 10/14/15 demonstrates sinus rhythm. Rate 86bpm. right  bundle branch block. Left anterior fascicular block. 12/03/16: Sinus rhythm. Rate 69 bpm. First degree AV block.  Right bundle branch block. Left anterior fascicular block.   Echo 11/03/15: Study Conclusions  - Procedure narrative: Transthoracic echocardiography. Image  quality was suboptimal. Tommy study was technically difficult.  Intravenous contrast (Definity) was administered. - Left ventricle: Tommy cavity size was mildly dilated. Wall  thickness was increased in a pattern of mild LVH. Systolic  function was normal. Tommy estimated ejection fraction was in Tommy  range of 55% to 60%. Wall motion was normal; there were no  regional wall motion abnormalities. Acoustic contrast  opacification revealed no evidence ofthrombus. - Left atrium: Tommy atrium was mildly dilated. - Right ventricle: Tommy cavity size was mildly dilated.   Lexiscan Myoview 11/03/15:  Nuclear stress EF: 44%.  There was no ST segment deviation noted during stress.  No T wave inversion was noted during stress.  Defect 1: There is a small defect of moderate severity present in Tommy apex location. Apical thinning vs prior infarct.  This is an intermediate risk study due to reduced LVEF.  Tommy left ventricular ejection fraction is moderately decreased (30-44%).  No ischemia.  Recent Labs: No results found for requested labs within last 8760 hours.    Lipid Panel No results found for:  CHOL, TRIG, HDL, CHOLHDL, VLDL, LDLCALC, LDLDIRECT   09/13/15: Sodium 142, potassium 4.1, BUN 14, creatinine 0.9 AST 28, PLT 48 WBC 8.2, hemoglobin 14, hematocrit 42.1, platelets 224 Total cholesterol 118, triglycerides 138, HDL 28, LDL 62 TSH 3.37 Hemoglobin A1c 7.6%  Wt Readings from Last 3 Encounters:  12/03/16 (!) 161.5 kg (356 lb)  10/10/16 (!) 160.1 kg (353 lb)  12/22/15 (!) 168.1 kg (370 lb 9.6 oz)      ASSESSMENT AND PLAN:  # Shortness of breath:  Tommy [name] shortness of breath is stable.   His echo showed normal systolic function. There was some degree of diastolic dysfunction but Tommy degree could not be determined by his echo. His volume status is stable. His shortness of breath seems to have improved somewhat with weight loss. He was encouraged to continue working on his dietary changes, given that he is limited in his ability to exercise at this time.  # Hypertension:  Blood pressure is poorly-controlled but has been much better at home.  This is likely 2/2 pain and prednisone.  Continue nebivolol, amlodipine, valsartan and HCTZ.  # CV Disease Prevention:  ASCVD 10 year risk is 29%.  This is mostly attributable to his low HDL.  However, his LDL is 62. Therefore, we will not start a statin. He is due for a physical and labs with Dr. Virgina Jock soon.     Current medicines are reviewed at length with Tommy [name] today.  Tommy [name] does not have concerns regarding medicines.  Tommy following changes have been made:  no change  Labs/ tests ordered today include:   Orders Placed This Encounter  Procedures  . EKG 12-Lead    Disposition:   FU with Tommy Uher C. Oval Linsey, MD, [facility] in 1 year.   This note was written with Tommy assistance of speech recognition software.  Please excuse any transcriptional errors.  Signed, Tyquavious Gamel C. Oval Linsey, MD, [facility]  12/03/2016 10:11 AM    [place]

## 2016-12-06 NOTE — Telephone Encounter (Signed)
Patient called office in reference to contacting [facility] to come pick up the oxygen from his home.  They notified patient Dr. Brett Fairy will need to send them a letter stating the patient has discontinued the oxygen before they can pick it up.  Please call

## 2016-12-08 ENCOUNTER — Other Ambulatory Visit: Payer: Self-pay | Admitting: Cardiovascular Disease

## 2016-12-10 ENCOUNTER — Other Ambulatory Visit: Payer: Self-pay | Admitting: Neurology

## 2016-12-10 DIAGNOSIS — G4733 Obstructive sleep apnea (adult) (pediatric): Secondary | ICD-10-CM

## 2016-12-10 NOTE — Telephone Encounter (Signed)
Refill Request.  

## 2016-12-10 NOTE — Telephone Encounter (Signed)
REFILL 

## 2016-12-10 NOTE — Telephone Encounter (Signed)
This is Dr. Bancroft's pt.  °

## 2017-04-01 ENCOUNTER — Telehealth: Payer: Self-pay | Admitting: *Deleted

## 2017-04-01 NOTE — Telephone Encounter (Signed)
   [facility] Pre-operative Risk Assessment    Request for surgical clearance:  1. What type of surgery is being performed? RIGHT HIP: THA W/WO AUTOGRAFT/ALLOGRAFT   2. When is this surgery scheduled? 06-05-2017   3. Are there any medications that need to be held prior to surgery and how long?NONE   4. Practice name and name of physician performing surgery? Gaynelle Arabian MD  5. What is your office phone and fax number? PH=[phone] FAX=[phone]   6. Anesthesia type (None, local, MAC, general) ? UNKNOWN   Fredia Beets 04/01/2017, 4:55 PM  _________________________________________________________________   (provider comments below)

## 2017-04-02 NOTE — Telephone Encounter (Signed)
Pt is 0.4% risk of major cardiac event with his hip surgery, I have called him to eval if stable since last visit.  Waiting for call back.

## 2017-04-02 NOTE — Telephone Encounter (Signed)
   Chart reviewed as part of pre-operative protocol coverage. Given past medical history and time since last visit, based on ACC/AHA guidelines, Tommy [name] would be at acceptable risk for the planned procedure without further cardiovascular testing.  Low risk for surgery.  I will route this recommendation to the requesting party via Epic fax function and remove from pre-op pool.  Please call with questions.  Cecilie Kicks, NP 04/02/2017, 4:02 PM

## 2017-04-28 ENCOUNTER — Ambulatory Visit: Payer: Self-pay | Admitting: Orthopedic Surgery

## 2017-05-15 ENCOUNTER — Ambulatory Visit: Payer: Self-pay | Admitting: Orthopedic Surgery

## 2017-05-15 NOTE — H&P (Signed)
Tommy [name] DOB: 1945-06-23 Married / Language: English / Race: White Male Date of Admission:  06/05/2017 CC:  Right hip pain History of Present Illness  The patient is a 71 year old male who comes in  for a preoperative History and Physical. The patient is scheduled for a right total hip arthroplasty (anterior) to be performed by Dr. Dione Plover. Aluisio, MD at [facility] on 06-05-17. The patient is a 71 year old male who presented for follow up of their hip. The patient is being followed for their right hip pain and osteoarthritis. They are months out from IA injection with Dr.Ramos. Symptoms reported include: pain, pain with weightbearing, difficulty ambulating and difficulty arising from chair. The patient feels that they are doing poorly and report their pain level to be moderate. The following medication has been used for pain control: antiinflammatory medication (naproxen). The patient presents today following IA injection. The patient has not gotten any relief of their symptoms with Cortisone injections (short term relief). The hip was hurting him at all times. It is getting progressively worse. The injection only helped for a few weeks. He has pain during the day and at night. His movement is more limited. He says he has lost approximately 30 pounds and is hoping that he is at a weight where we can proceed with surgery. He is now at a point where he would like to proceed with surgery. They have been treated conservatively in the past for the above stated problem and despite conservative measures, they continue to have progressive pain and severe functional limitations and dysfunction. They have failed non-operative management including home exercise, medications, and injections. It is felt that they would benefit from undergoing total joint replacement. Risks and benefits of the procedure have been discussed with the patient and they elect to proceed with surgery. There are no  active contraindications to surgery such as ongoing infection or rapidly progressive neurological disease.  Problem List/Past Medical Lumbar disc displacement (M51.26)  Status post lumbar surgery (O29.476)  Other orthopedic aftercare (Z47.89)  Lumbar radiculopathy (M54.16)  Primary osteoarthritis of right hip (M16.11)  Pain of right hip joint (M25.551)  DDD (degenerative disc disease), lumbar (M51.36)  Gastroesophageal Reflux Disease  Skin Cancer  Left Leg High blood pressure  Lumbar pain (M54.5)  Sleep Apnea  uses CPAP Non-Insulin Dependent Diabetes Mellitus   Allergies Micardis *ANTIHYPERTENSIVES*  Hives.  Family History Congestive Heart Failure  father Heart Disease  sister Cancer  sister Hypertension  First Degree Relatives. mother and father Heart disease in male family member before age 19   Social History  Current work status  retired Marital status  married Pain Contract  no Tobacco / smoke exposure  no Alcohol use  never consumed alcohol Tobacco use  Never smoker. never smoker Number of flights of stairs before winded  1 Exercise  Exercises rarely; does running / walking Drug/Alcohol Rehab (Previously)  no Children  2 Drug/Alcohol Rehab (Currently)  no Illicit drug use  no Living situation  live with spouse Post-Surgical Plans  Home With Family, Home with HHPT. Advance Directives  Living Will, Healthcare Power of [place].  Medication History  Bystolic (20MG  Tablet, Oral) Active. Metformin Active. Invokana (100MG  Tablet, Oral) Active. Diovan HCT (320-25MG  Tablet, 1 Oral daily) Active. Norvasc (10MG  Tablet, 1 Oral daily) Active. Singulair (10MG  Tablet, Oral daily) Active. Nasonex (50MCG/ACT Suspension, Nasal) Active. Atrovent (0.06% Solution, Nasal as needed) Active. Naproxen (500MG  Tablet, Oral) Active. Aspirin (81MG  Tablet, 1 (one) Oral) Active.  Glucosamine Complex (1 (one) Oral) Active. Fiber Capsule  (daily) Active. Multiple Vitamin (1 (one) Oral) Active.  Past Surgical History  Tonsillectomy  Date: 1966. Spinal Surgery  Date: 1998. 2016 Colon Polyp Removal - Colonoscopy    Review of Systems  General Present- Weight Loss. Not Present- Chills, Fatigue, Fever, Memory Loss, Night Sweats and Weight Gain. Skin Not Present- Eczema, Hives, Itching, Lesions and Rash. HEENT Not Present- Dentures, Double Vision, Headache, Hearing Loss, Tinnitus and Visual Loss. Respiratory Not Present- Allergies, Chronic Cough, Coughing up blood, Shortness of breath at rest and Shortness of breath with exertion. Cardiovascular Not Present- Chest Pain, Difficulty Breathing Lying Down, Murmur, Palpitations, Racing/skipping heartbeats and Swelling. Gastrointestinal Not Present- Abdominal Pain, Bloody Stool, Constipation, Diarrhea, Difficulty Swallowing, Heartburn, Jaundice, Loss of appetitie, Nausea and Vomiting. Male Genitourinary Not Present- Blood in Urine, Discharge, Flank Pain, Incontinence, Painful Urination, Urgency, Urinary frequency, Urinary Retention, Urinating at Night and Weak urinary stream. Musculoskeletal Present- Joint Pain and Morning Stiffness. Not Present- Back Pain, Joint Swelling, Muscle Pain, Muscle Weakness and Spasms. Neurological Not Present- Blackout spells, Difficulty with balance, Dizziness, Paralysis, Tremor and Weakness. Psychiatric Not Present- Insomnia.  Vitals Weight: 308 lb Height: 73in Body Surface Area: 2.59 m Body Mass Index: 40.64 kg/m  Pulse: 56 (Regular)  BP: 124/68 (Sitting, Right Arm, Standard)  Physical Exam General Mental Status -Alert, cooperative and good historian. General Appearance-pleasant, Not in acute distress. Orientation-Oriented X3. Build & Nutrition-Well nourished and Well developed.  Head and Neck Head-normocephalic, atraumatic . Neck Global Assessment - supple, no bruit auscultated on the right, no bruit auscultated on the  left.  Eye Vision-Wears corrective lenses(mainly readers). Pupil - Bilateral-Regular and Round. Motion - Bilateral-EOMI.  Chest and Lung Exam Auscultation Breath sounds - clear at anterior chest wall and clear at posterior chest wall. Adventitious sounds - No Adventitious sounds.  Cardiovascular Auscultation Rhythm - Regular rate and rhythm. Heart Sounds - S1 WNL and S2 WNL. Murmurs & Other Heart Sounds - Auscultation of the heart reveals - No Murmurs.  Abdomen Palpation/Percussion Tenderness - Abdomen is non-tender to palpation. Rigidity (guarding) - Abdomen is soft. Auscultation Auscultation of the abdomen reveals - Bowel sounds normal.  Male Genitourinary Note: Not done, not pertinent to present illness   Musculoskeletal Note: Evaluation of the left hip shows flexion to 120 rotation in 30 out 40 and abduction 40 without discomfort. There is no tenderness over the greater trochanter. There is no pain on provocative testing of the hip. His RIGHT hip can be flexed to 100 with no internal or external rotation only about 10 of abduction. His gait pattern is significantly antalgic on the RIGHT.  Radiographs AP pelvis and lateral of the RIGHT hip showed that he has bone-on-bone arthritis of the RIGHT hip which has progressed significantly since his last x-rays about 6 months ago. He had some joint space LEFT at that time and now the joint space is completely obliterated and he is completely bone on bone   Assessment & Plan Primary osteoarthritis of right hip (M16.11)  Note:Surgical Plans: Right Total Hip Replacement - Anterior Approach  Disposition: Home, HHPT  PCP: Dr. Virgina Jock Cards: Dr. Oval Linsey  IV TXA  Anesthesia Issues: Patient woke up "wild" after last back surgery  Patient was instructed on what medications to stop prior to surgery.  Signed electronically by Joelene Millin, III PA-C

## 2017-05-15 NOTE — H&P (View-Only) (Signed)
Tommy [name] DOB: Jun 23, 1945 Married / Language: English / Race: White Male Date of Admission:  06/05/2017 CC:  Right hip pain History of Present Illness  The patient is a 71 year old male who comes in  for a preoperative History and Physical. The patient is scheduled for a right total hip arthroplasty (anterior) to be performed by Dr. Dione Plover. Aluisio, MD at [facility] on 06-05-17. The patient is a 71 year old male who presented for follow up of their hip. The patient is being followed for their right hip pain and osteoarthritis. They are months out from IA injection with Dr.Ramos. Symptoms reported include: pain, pain with weightbearing, difficulty ambulating and difficulty arising from chair. The patient feels that they are doing poorly and report their pain level to be moderate. The following medication has been used for pain control: antiinflammatory medication (naproxen). The patient presents today following IA injection. The patient has not gotten any relief of their symptoms with Cortisone injections (short term relief). The hip was hurting him at all times. It is getting progressively worse. The injection only helped for a few weeks. He has pain during the day and at night. His movement is more limited. He says he has lost approximately 30 pounds and is hoping that he is at a weight where we can proceed with surgery. He is now at a point where he would like to proceed with surgery. They have been treated conservatively in the past for the above stated problem and despite conservative measures, they continue to have progressive pain and severe functional limitations and dysfunction. They have failed non-operative management including home exercise, medications, and injections. It is felt that they would benefit from undergoing total joint replacement. Risks and benefits of the procedure have been discussed with the patient and they elect to proceed with surgery. There are no  active contraindications to surgery such as ongoing infection or rapidly progressive neurological disease.  Problem List/Past Medical Lumbar disc displacement (M51.26)  Status post lumbar surgery (C37.543)  Other orthopedic aftercare (Z47.89)  Lumbar radiculopathy (M54.16)  Primary osteoarthritis of right hip (M16.11)  Pain of right hip joint (M25.551)  DDD (degenerative disc disease), lumbar (M51.36)  Gastroesophageal Reflux Disease  Skin Cancer  Left Leg High blood pressure  Lumbar pain (M54.5)  Sleep Apnea  uses CPAP Non-Insulin Dependent Diabetes Mellitus   Allergies Micardis *ANTIHYPERTENSIVES*  Hives.  Family History Congestive Heart Failure  father Heart Disease  sister Cancer  sister Hypertension  First Degree Relatives. mother and father Heart disease in male family member before age 19   Social History  Current work status  retired Marital status  married Pain Contract  no Tobacco / smoke exposure  no Alcohol use  never consumed alcohol Tobacco use  Never smoker. never smoker Number of flights of stairs before winded  1 Exercise  Exercises rarely; does running / walking Drug/Alcohol Rehab (Previously)  no Children  2 Drug/Alcohol Rehab (Currently)  no Illicit drug use  no Living situation  live with spouse Post-Surgical Plans  Home With Family, Home with HHPT. Advance Directives  Living Will, Healthcare Power of [place].  Medication History  Bystolic (20MG  Tablet, Oral) Active. Metformin Active. Invokana (100MG  Tablet, Oral) Active. Diovan HCT (320-25MG  Tablet, 1 Oral daily) Active. Norvasc (10MG  Tablet, 1 Oral daily) Active. Singulair (10MG  Tablet, Oral daily) Active. Nasonex (50MCG/ACT Suspension, Nasal) Active. Atrovent (0.06% Solution, Nasal as needed) Active. Naproxen (500MG  Tablet, Oral) Active. Aspirin (81MG  Tablet, 1 (one) Oral) Active.  Glucosamine Complex (1 (one) Oral) Active. Fiber Capsule  (daily) Active. Multiple Vitamin (1 (one) Oral) Active.  Past Surgical History  Tonsillectomy  Date: 1966. Spinal Surgery  Date: 1998. 2016 Colon Polyp Removal - Colonoscopy    Review of Systems  General Present- Weight Loss. Not Present- Chills, Fatigue, Fever, Memory Loss, Night Sweats and Weight Gain. Skin Not Present- Eczema, Hives, Itching, Lesions and Rash. HEENT Not Present- Dentures, Double Vision, Headache, Hearing Loss, Tinnitus and Visual Loss. Respiratory Not Present- Allergies, Chronic Cough, Coughing up blood, Shortness of breath at rest and Shortness of breath with exertion. Cardiovascular Not Present- Chest Pain, Difficulty Breathing Lying Down, Murmur, Palpitations, Racing/skipping heartbeats and Swelling. Gastrointestinal Not Present- Abdominal Pain, Bloody Stool, Constipation, Diarrhea, Difficulty Swallowing, Heartburn, Jaundice, Loss of appetitie, Nausea and Vomiting. Male Genitourinary Not Present- Blood in Urine, Discharge, Flank Pain, Incontinence, Painful Urination, Urgency, Urinary frequency, Urinary Retention, Urinating at Night and Weak urinary stream. Musculoskeletal Present- Joint Pain and Morning Stiffness. Not Present- Back Pain, Joint Swelling, Muscle Pain, Muscle Weakness and Spasms. Neurological Not Present- Blackout spells, Difficulty with balance, Dizziness, Paralysis, Tremor and Weakness. Psychiatric Not Present- Insomnia.  Vitals Weight: 308 lb Height: 73in Body Surface Area: 2.59 m Body Mass Index: 40.64 kg/m  Pulse: 56 (Regular)  BP: 124/68 (Sitting, Right Arm, Standard)  Physical Exam General Mental Status -Alert, cooperative and good historian. General Appearance-pleasant, Not in acute distress. Orientation-Oriented X3. Build & Nutrition-Well nourished and Well developed.  Head and Neck Head-normocephalic, atraumatic . Neck Global Assessment - supple, no bruit auscultated on the right, no bruit auscultated on the  left.  Eye Vision-Wears corrective lenses(mainly readers). Pupil - Bilateral-Regular and Round. Motion - Bilateral-EOMI.  Chest and Lung Exam Auscultation Breath sounds - clear at anterior chest wall and clear at posterior chest wall. Adventitious sounds - No Adventitious sounds.  Cardiovascular Auscultation Rhythm - Regular rate and rhythm. Heart Sounds - S1 WNL and S2 WNL. Murmurs & Other Heart Sounds - Auscultation of the heart reveals - No Murmurs.  Abdomen Palpation/Percussion Tenderness - Abdomen is non-tender to palpation. Rigidity (guarding) - Abdomen is soft. Auscultation Auscultation of the abdomen reveals - Bowel sounds normal.  Male Genitourinary Note: Not done, not pertinent to present illness   Musculoskeletal Note: Evaluation of the left hip shows flexion to 120 rotation in 30 out 40 and abduction 40 without discomfort. There is no tenderness over the greater trochanter. There is no pain on provocative testing of the hip. His RIGHT hip can be flexed to 100 with no internal or external rotation only about 10 of abduction. His gait pattern is significantly antalgic on the RIGHT.  Radiographs AP pelvis and lateral of the RIGHT hip showed that he has bone-on-bone arthritis of the RIGHT hip which has progressed significantly since his last x-rays about 6 months ago. He had some joint space LEFT at that time and now the joint space is completely obliterated and he is completely bone on bone   Assessment & Plan Primary osteoarthritis of right hip (M16.11)  Note:Surgical Plans: Right Total Hip Replacement - Anterior Approach  Disposition: Home, HHPT  PCP: Dr. Virgina Jock Cards: Dr. Oval Linsey  IV TXA  Anesthesia Issues: Patient woke up "wild" after last back surgery  Patient was instructed on what medications to stop prior to surgery.  Signed electronically by Joelene Millin, III PA-C

## 2017-05-17 NOTE — Telephone Encounter (Signed)
error 

## 2017-05-27 ENCOUNTER — Encounter (HOSPITAL_COMMUNITY): Payer: Self-pay

## 2017-05-27 NOTE — Pre-Procedure Instructions (Signed)
Last office notes Dr. Virgina Jock 03/29/17 in chart The following are in epic: Cardiac clearance L. Ingold NP 04/02/17 in epic Last office notes Dr. Oval Linsey 12/03/16 EKG 12/03/16 ECHO 11/03/15

## 2017-05-28 NOTE — Patient Instructions (Addendum)
Your procedure is scheduled on: Wednesday, Jan. 16, 2019   Surgery Time:  11:30AM-1:30PM   Report to [facility]tr[facility]    Report to admitting at 9:00 AM   Call this number if you have problems the morning of surgery [phone]   Do not eat food or drink liquids :After Midnight.   Do NOT smoke after Midnight   Take these medicines the morning of surgery with A SIP OF WATER: Amlodipine, Bystolic, Singulair   Use nasal spray per normal routine   DO NOT TAKE ANY DIABETIC MEDICATIONS DAY OF YOUR SURGERY                               You may not have any metal on your body including jewelry, and body piercings             Do not wear lotions, powders, perfumes/cologne, or deodorant                          Men may shave face and neck.   Do not bring valuables to the hospital. [place].   Contacts, dentures or bridgework may not be worn into surgery.   Leave suitcase in the car. After surgery it may be brought to your room.   Bring CPAP mask and tubing day of surgery   Special Instructions: Bring a copy of your healthcare power of attorney and living will documents         the day of surgery if you haven't scanned them in before.              Please read over the following fact sheets you were given:   [facility] - Preparing for Surgery Before surgery, you can play an important role.  Because skin is not sterile, your skin needs to be as free of germs as possible.  You can reduce the number of germs on your skin by washing with CHG (chlorahexidine gluconate) soap before surgery.  CHG is an antiseptic cleaner which kills germs and bonds with the skin to continue killing germs even after washing. Please DO NOT use if you have an allergy to CHG or antibacterial soaps.  If your skin becomes reddened/irritated stop using the CHG and inform your nurse when you arrive at Short Stay. Do not shave (including legs  and underarms) for at least 48 hours prior to the first CHG shower.  You may shave your face/neck.  Please follow these instructions carefully:  1.  Shower with CHG Soap the night before surgery and the  morning of surgery.  2.  If you choose to wash your hair, wash your hair first as usual with your normal  shampoo.  3.  After you shampoo, rinse your hair and body thoroughly to remove the shampoo.                             4.  Use CHG as you would any other liquid soap.  You can apply chg directly to the skin and wash.  Gently with a scrungie or clean washcloth.  5.  Apply the CHG Soap to your body ONLY FROM THE NECK DOWN.   Do   not use  on face/ open                           Wound or open sores. Avoid contact with eyes, ears mouth and   genitals (private parts).                       Wash face,  Genitals (private parts) with your normal soap.             6.  Wash thoroughly, paying special attention to the area where your    surgery  will be performed.  7.  Thoroughly rinse your body with warm water from the neck down.  8.  DO NOT shower/wash with your normal soap after using and rinsing off the CHG Soap.                9.  Pat yourself dry with a clean towel.            10.  Wear clean pajamas.            11.  Place clean sheets on your bed the night of your first shower and do not  sleep with pets. Day of Surgery : Do not apply any lotions/deodorants the morning of surgery.  Please wear clean clothes to the hospital/surgery center.  FAILURE TO FOLLOW THESE INSTRUCTIONS MAY RESULT IN THE CANCELLATION OF YOUR SURGERY  PATIENT SIGNATURE_________________________________  NURSE SIGNATURE__________________________________  ________________________________________________________________________   Tommy [name]  An incentive spirometer is a tool that can help keep your lungs clear and active. This tool measures how well you are filling your lungs with each breath. Taking long  deep breaths may help reverse or decrease the chance of developing breathing (pulmonary) problems (especially infection) following:  A long period of time when you are unable to move or be active. BEFORE THE PROCEDURE   If the spirometer includes an indicator to show your best effort, your nurse or respiratory therapist will set it to a desired goal.  If possible, sit up straight or lean slightly forward. Try not to slouch.  Hold the incentive spirometer in an upright position. INSTRUCTIONS FOR USE  1. Sit on the edge of your bed if possible, or sit up as far as you can in bed or on a chair. 2. Hold the incentive spirometer in an upright position. 3. Breathe out normally. 4. Place the mouthpiece in your mouth and seal your lips tightly around it. 5. Breathe in slowly and as deeply as possible, raising the piston or the ball toward the top of the column. 6. Hold your breath for 3-5 seconds or for as long as possible. Allow the piston or ball to fall to the bottom of the column. 7. Remove the mouthpiece from your mouth and breathe out normally. 8. Rest for a few seconds and repeat Steps 1 through 7 at least 10 times every 1-2 hours when you are awake. Take your time and take a few normal breaths between deep breaths. 9. The spirometer may include an indicator to show your best effort. Use the indicator as a goal to work toward during each repetition. 10. After each set of 10 deep breaths, practice coughing to be sure your lungs are clear. If you have an incision (the cut made at the time of surgery), support your incision when coughing by placing a pillow or rolled up towels firmly against it. Once you are able to get  out of bed, walk around indoors and cough well. You may stop using the incentive spirometer when instructed by your caregiver.  RISKS AND COMPLICATIONS  Take your time so you do not get dizzy or light-headed.  If you are in pain, you may need to take or ask for pain medication  before doing incentive spirometry. It is harder to take a deep breath if you are having pain. AFTER USE  Rest and breathe slowly and easily.  It can be helpful to keep track of a log of your progress. Your caregiver can provide you with a simple table to help with this. If you are using the spirometer at home, follow these instructions: [place] IF:   You are having difficultly using the spirometer.  You have trouble using the spirometer as often as instructed.  Your pain medication is not giving enough relief while using the spirometer.  You develop fever of 100.5 F (38.1 C) or higher. SEEK IMMEDIATE MEDICAL CARE IF:   You cough up bloody sputum that had not been present before.  You develop fever of 102 F (38.9 C) or greater.  You develop worsening pain at or near the incision site. MAKE SURE YOU:   Understand these instructions.  Will watch your condition.  Will get help right away if you are not doing well or get worse. Document Released: 09/17/2006 Document Revised: 07/30/2011 Document Reviewed: 11/18/2006 [employer] Patient Information 2014 [employer], [place].   ________________________________________________________________________  WHAT IS A BLOOD TRANSFUSION? Blood Transfusion Information  A transfusion is the replacement of blood or some of its parts. Blood is made up of multiple cells which provide different functions.  Red blood cells carry oxygen and are used for blood loss replacement.  White blood cells fight against infection.  Platelets control bleeding.  Plasma helps clot blood.  Other blood products are available for specialized needs, such as hemophilia or other clotting disorders. BEFORE THE TRANSFUSION  Who gives blood for transfusions?   Healthy volunteers who are fully evaluated to make sure their blood is safe. This is blood bank blood. Transfusion therapy is the safest it has ever been in the practice of medicine. Before blood is  taken from a donor, a complete history is taken to make sure that person has no history of diseases nor engages in risky social behavior (examples are intravenous drug use or sexual activity with multiple partners). The donor's travel history is screened to minimize risk of transmitting infections, such as malaria. The donated blood is tested for signs of infectious diseases, such as HIV and hepatitis. The blood is then tested to be sure it is compatible with you in order to minimize the chance of a transfusion reaction. If you or a relative donates blood, this is often done in anticipation of surgery and is not appropriate for emergency situations. It takes many days to process the donated blood. RISKS AND COMPLICATIONS Although transfusion therapy is very safe and saves many lives, the main dangers of transfusion include:   Getting an infectious disease.  Developing a transfusion reaction. This is an allergic reaction to something in the blood you were given. Every precaution is taken to prevent this. The decision to have a blood transfusion has been considered carefully by your caregiver before blood is given. Blood is not given unless the benefits outweigh the risks. AFTER THE TRANSFUSION  Right after receiving a blood transfusion, you will usually feel much better and more energetic. This is especially true if your  red blood cells have gotten low (anemic). The transfusion raises the level of the red blood cells which carry oxygen, and this usually causes an energy increase.  The nurse administering the transfusion will monitor you carefully for complications. HOME CARE INSTRUCTIONS  No special instructions are needed after a transfusion. You may find your energy is better. Speak with your caregiver about any limitations on activity for underlying diseases you may have. SEEK MEDICAL CARE IF:   Your condition is not improving after your transfusion.  You develop redness or irritation at the  intravenous (IV) site. SEEK IMMEDIATE MEDICAL CARE IF:  Any of the following symptoms occur over the next 12 hours:  Shaking chills.  You have a temperature by mouth above 102 F (38.9 C), not controlled by medicine.  Chest, back, or muscle pain.  People around you feel you are not acting correctly or are confused.  Shortness of breath or difficulty breathing.  Dizziness and fainting.  You get a rash or develop hives.  You have a decrease in urine output.  Your urine turns a dark color or changes to pink, red, or brown. Any of the following symptoms occur over the next 10 days:  You have a temperature by mouth above 102 F (38.9 C), not controlled by medicine.  Shortness of breath.  Weakness after normal activity.  The white part of the eye turns yellow (jaundice).  You have a decrease in the amount of urine or are urinating less often.  Your urine turns a dark color or changes to pink, red, or brown. Document Released: 05/04/2000 Document Revised: 07/30/2011 Document Reviewed: 12/22/2007 [facility] Patient Information 2014 [place], [place].  _______________________________________________________________________

## 2017-05-30 ENCOUNTER — Encounter (HOSPITAL_COMMUNITY): Payer: Self-pay

## 2017-05-30 ENCOUNTER — Encounter (HOSPITAL_COMMUNITY)
Admission: RE | Admit: 2017-05-30 | Discharge: 2017-05-30 | Disposition: A | Discharge status: Home / Self Care | Payer: Medicare Other | Source: Ambulatory Visit | Attending: Orthopedic Surgery | Admitting: Orthopedic Surgery

## 2017-05-30 ENCOUNTER — Other Ambulatory Visit: Payer: Self-pay

## 2017-05-30 DIAGNOSIS — E119 Type 2 diabetes mellitus without complications: Secondary | ICD-10-CM | POA: Insufficient documentation

## 2017-05-30 DIAGNOSIS — Z01818 Encounter for other preprocedural examination: Secondary | ICD-10-CM | POA: Diagnosis not present

## 2017-05-30 DIAGNOSIS — M1611 Unilateral primary osteoarthritis, right hip: Secondary | ICD-10-CM | POA: Diagnosis not present

## 2017-05-30 HISTORY — DX: Male erectile dysfunction, unspecified: N52.9

## 2017-05-30 HISTORY — DX: Other intervertebral disc degeneration, lumbar region without mention of lumbar back pain or lower extremity pain: M51.369

## 2017-05-30 HISTORY — DX: Other intervertebral disc degeneration, lumbar region: M51.36

## 2017-05-30 HISTORY — DX: Atrioventricular block, first degree: I44.0

## 2017-05-30 HISTORY — DX: Spondylosis, unspecified: M47.9

## 2017-05-30 HISTORY — DX: Personal history of other specified conditions: Z87.898

## 2017-05-30 HISTORY — DX: Constipation, unspecified: K59.00

## 2017-05-30 HISTORY — DX: Fatty (change of) liver, not elsewhere classified: K76.0

## 2017-05-30 HISTORY — DX: Unspecified right bundle-branch block: I45.10

## 2017-05-30 HISTORY — DX: Benign paroxysmal vertigo, unspecified ear: H81.10

## 2017-05-30 HISTORY — DX: Unspecified osteoarthritis, unspecified site: M19.90

## 2017-05-30 HISTORY — DX: Type 2 diabetes mellitus with diabetic neuropathy, unspecified: E11.40

## 2017-05-30 LAB — SURGICAL PCR SCREEN
MRSA, PCR: NEGATIVE
STAPHYLOCOCCUS AUREUS: NEGATIVE

## 2017-05-30 LAB — HEMOGLOBIN A1C
HEMOGLOBIN A1C: 5.2 % (ref 4.8–5.6)
MEAN PLASMA GLUCOSE: 102.54 mg/dL

## 2017-05-30 LAB — CBC
HEMATOCRIT: 45.3 % (ref 39.0–52.0)
HEMOGLOBIN: 15.1 g/dL (ref 13.0–17.0)
MCH: 29.8 pg (ref 26.0–34.0)
MCHC: 33.3 g/dL (ref 30.0–36.0)
MCV: 89.3 fL (ref 78.0–100.0)
Platelets: 230 10*3/uL (ref 150–400)
RBC: 5.07 MIL/uL (ref 4.22–5.81)
RDW: 15.4 % (ref 11.5–15.5)
WBC: 7.4 10*3/uL (ref 4.0–10.5)

## 2017-05-30 LAB — GLUCOSE, CAPILLARY: GLUCOSE-CAPILLARY: 85 mg/dL (ref 65–99)

## 2017-05-30 LAB — COMPREHENSIVE METABOLIC PANEL
ALBUMIN: 4.2 g/dL (ref 3.5–5.0)
ALK PHOS: 115 U/L (ref 38–126)
ALT: 17 U/L (ref 17–63)
AST: 21 U/L (ref 15–41)
Anion gap: 7 (ref 5–15)
BILIRUBIN TOTAL: 1.2 mg/dL (ref 0.3–1.2)
BUN: 10 mg/dL (ref 6–20)
CALCIUM: 9.5 mg/dL (ref 8.9–10.3)
CO2: 28 mmol/L (ref 22–32)
CREATININE: 0.68 mg/dL (ref 0.61–1.24)
Chloride: 104 mmol/L (ref 101–111)
GFR calc Af Amer: >60 mL/min (ref >60)
GFR calc non Af Amer: >60 mL/min (ref >60)
GLUCOSE: 95 mg/dL (ref 65–99)
Potassium: 3.5 mmol/L (ref 3.5–5.1)
Sodium: 139 mmol/L (ref 135–145)
TOTAL PROTEIN: 7.4 g/dL (ref 6.5–8.1)

## 2017-05-30 LAB — ABO/RH: ABO/RH(D): A POS

## 2017-05-30 LAB — PROTIME-INR
INR: 1.07
Prothrombin Time: 13.8 seconds (ref 11.4–15.2)

## 2017-05-30 LAB — APTT: aPTT: 33 seconds (ref 24–36)

## 2017-06-04 MED ORDER — TRANEXAMIC ACID 1000 MG/10ML IV SOLN
1000.0000 mg | INTRAVENOUS | Status: AC
  Administered 2017-06-05: 1000 mg via INTRAVENOUS
  Filled 2017-06-04: qty 1100

## 2017-06-04 MED ORDER — DEXTROSE 5 % IV SOLN
3.0000 g | INTRAVENOUS | Status: AC
  Administered 2017-06-05: 3 g via INTRAVENOUS
  Filled 2017-06-04: qty 3

## 2017-06-04 NOTE — Anesthesia Preprocedure Evaluation (Addendum)
Anesthesia Evaluation  Patient identified by MRN, date of birth, ID band Patient awake    Reviewed: Allergy & Precautions, H&P , NPO status , Patient's Chart, lab work & pertinent test results  History of Anesthesia Complications (+) Emergence Delirium and history of anesthetic complications  Airway Mallampati: II  TM Distance: >3 FB Neck ROM: Full    Dental  (+) Teeth Intact, Dental Advisory Given   Pulmonary sleep apnea and Continuous Positive Airway Pressure Ventilation ,    breath sounds clear to auscultation       Cardiovascular hypertension, Pt. on medications and Pt. on home beta blockers + dysrhythmias  Rhythm:Regular Rate:Normal     Neuro/Psych  Neuromuscular disease    GI/Hepatic hiatal hernia, GERD  ,  Endo/Other  diabetesMorbid obesity  Renal/GU      Musculoskeletal  (+) Arthritis , Osteoarthritis,    Abdominal (+) + obese,   Peds  Hematology   Anesthesia Other Findings Echo 6/17  Left ventricle: The cavity size was mildly dilated. Wall   thickness was increased in a pattern of mild LVH. Systolic   function was normal. The estimated ejection fraction was in the   range of 55% to 60%. Wall motion was normal; there were no   regional wall motion abnormalities.  Reproductive/Obstetrics                             Anesthesia Physical  Anesthesia Plan  ASA: III  Anesthesia Plan: Spinal   Post-op Pain Management:    Induction: Intravenous  PONV Risk Score and Plan: 1 and Treatment may vary due to age or medical condition  Airway Management Planned: Nasal Cannula and Natural Airway  Additional Equipment:   Intra-op Plan:   Post-operative Plan:   Informed Consent: I have reviewed the patients History and Physical, chart, labs and discussed the procedure including the risks, benefits and alternatives for the proposed anesthesia with the patient or authorized  representative who has indicated his/her understanding and acceptance.     Plan Discussed with: CRNA, Surgeon and Anesthesiologist  Anesthesia Plan Comments: (  Previous Single level discectomy ( appears to be L2-L3 by MRI) and RF Lumbar spine,  No metal posterior. )       Anesthesia Quick Evaluation

## 2017-06-05 ENCOUNTER — Inpatient Hospital Stay (HOSPITAL_COMMUNITY): Payer: Medicare Other | Admitting: Anesthesiology

## 2017-06-05 ENCOUNTER — Encounter (HOSPITAL_COMMUNITY): Payer: Self-pay | Admitting: Emergency Medicine

## 2017-06-05 ENCOUNTER — Inpatient Hospital Stay (HOSPITAL_COMMUNITY): Payer: Medicare Other

## 2017-06-05 ENCOUNTER — Inpatient Hospital Stay (HOSPITAL_COMMUNITY)
Admission: RE | Admit: 2017-06-05 | Discharge: 2017-06-06 | DRG: 470 | Disposition: A | Discharge status: Home Health | Payer: Medicare Other | Source: Ambulatory Visit | Attending: Orthopedic Surgery | Admitting: Orthopedic Surgery

## 2017-06-05 ENCOUNTER — Other Ambulatory Visit: Payer: Self-pay

## 2017-06-05 ENCOUNTER — Encounter (HOSPITAL_COMMUNITY)
Admission: RE | Disposition: A | Discharge status: Home Health | Payer: Self-pay | Source: Ambulatory Visit | Attending: Orthopedic Surgery

## 2017-06-05 DIAGNOSIS — M19042 Primary osteoarthritis, left hand: Secondary | ICD-10-CM | POA: Diagnosis present

## 2017-06-05 DIAGNOSIS — Z7982 Long term (current) use of aspirin: Secondary | ICD-10-CM | POA: Diagnosis not present

## 2017-06-05 DIAGNOSIS — I10 Essential (primary) hypertension: Secondary | ICD-10-CM | POA: Diagnosis present

## 2017-06-05 DIAGNOSIS — Z791 Long term (current) use of non-steroidal anti-inflammatories (NSAID): Secondary | ICD-10-CM | POA: Diagnosis not present

## 2017-06-05 DIAGNOSIS — M479 Spondylosis, unspecified: Secondary | ICD-10-CM | POA: Diagnosis present

## 2017-06-05 DIAGNOSIS — M169 Osteoarthritis of hip, unspecified: Secondary | ICD-10-CM | POA: Diagnosis present

## 2017-06-05 DIAGNOSIS — Z79899 Other long term (current) drug therapy: Secondary | ICD-10-CM | POA: Diagnosis not present

## 2017-06-05 DIAGNOSIS — M19041 Primary osteoarthritis, right hand: Secondary | ICD-10-CM | POA: Diagnosis present

## 2017-06-05 DIAGNOSIS — M1611 Unilateral primary osteoarthritis, right hip: Principal | ICD-10-CM | POA: Diagnosis present

## 2017-06-05 DIAGNOSIS — Z6839 Body mass index (BMI) 39.0-39.9, adult: Secondary | ICD-10-CM | POA: Diagnosis not present

## 2017-06-05 DIAGNOSIS — Z9189 Other specified personal risk factors, not elsewhere classified: Secondary | ICD-10-CM

## 2017-06-05 DIAGNOSIS — E1140 Type 2 diabetes mellitus with diabetic neuropathy, unspecified: Secondary | ICD-10-CM | POA: Diagnosis present

## 2017-06-05 DIAGNOSIS — Z7984 Long term (current) use of oral hypoglycemic drugs: Secondary | ICD-10-CM

## 2017-06-05 DIAGNOSIS — E662 Morbid (severe) obesity with alveolar hypoventilation: Secondary | ICD-10-CM | POA: Diagnosis present

## 2017-06-05 DIAGNOSIS — Z888 Allergy status to other drugs, medicaments and biological substances status: Secondary | ICD-10-CM

## 2017-06-05 DIAGNOSIS — M549 Dorsalgia, unspecified: Secondary | ICD-10-CM | POA: Diagnosis present

## 2017-06-05 DIAGNOSIS — G8929 Other chronic pain: Secondary | ICD-10-CM | POA: Diagnosis present

## 2017-06-05 DIAGNOSIS — Z96649 Presence of unspecified artificial hip joint: Secondary | ICD-10-CM

## 2017-06-05 DIAGNOSIS — Z9989 Dependence on other enabling machines and devices: Secondary | ICD-10-CM | POA: Diagnosis not present

## 2017-06-05 DIAGNOSIS — M5116 Intervertebral disc disorders with radiculopathy, lumbar region: Secondary | ICD-10-CM | POA: Diagnosis present

## 2017-06-05 HISTORY — PX: TOTAL HIP ARTHROPLASTY: SHX124

## 2017-06-05 LAB — GLUCOSE, CAPILLARY
GLUCOSE-CAPILLARY: 96 mg/dL (ref 65–99)
Glucose-Capillary: 109 mg/dL — ABNORMAL HIGH (ref 65–99)
Glucose-Capillary: 167 mg/dL — ABNORMAL HIGH (ref 65–99)
Glucose-Capillary: 155 mg/dL — ABNORMAL HIGH (ref 65–99)

## 2017-06-05 LAB — TYPE AND SCREEN
ABO/RH(D): A POS
Antibody Screen: NEGATIVE

## 2017-06-05 SURGERY — ARTHROPLASTY, HIP, TOTAL, ANTERIOR APPROACH
Anesthesia: Spinal | Site: Hip | Laterality: Right

## 2017-06-05 MED ORDER — PHENYLEPHRINE 40 MCG/ML (10ML) SYRINGE FOR IV PUSH (FOR BLOOD PRESSURE SUPPORT)
PREFILLED_SYRINGE | INTRAVENOUS | Status: DC | PRN
  Administered 2017-06-05 (×3): 40 ug via INTRAVENOUS
  Administered 2017-06-05 (×2): 80 ug via INTRAVENOUS
  Administered 2017-06-05: 40 ug via INTRAVENOUS

## 2017-06-05 MED ORDER — FENTANYL CITRATE (PF) 100 MCG/2ML IJ SOLN
INTRAMUSCULAR | Status: AC
  Filled 2017-06-05: qty 4

## 2017-06-05 MED ORDER — MIDAZOLAM HCL 5 MG/5ML IJ SOLN
INTRAMUSCULAR | Status: DC | PRN
  Administered 2017-06-05 (×2): 1 mg via INTRAVENOUS

## 2017-06-05 MED ORDER — PROPOFOL 10 MG/ML IV BOLUS
INTRAVENOUS | Status: AC
Start: 2017-06-05 — End: 2017-06-05
  Filled 2017-06-05: qty 60

## 2017-06-05 MED ORDER — FLUTICASONE PROPIONATE 50 MCG/ACT NA SUSP
1.0000 | Freq: Every day | NASAL | Status: DC
  Filled 2017-06-05: qty 16

## 2017-06-05 MED ORDER — MENTHOL 3 MG MT LOZG: 1.0000 | LOZENGE | OROMUCOSAL | Status: DC | PRN

## 2017-06-05 MED ORDER — POLYETHYLENE GLYCOL 3350 17 G PO PACK: 17.0000 g | PACK | Freq: Every day | ORAL | Status: DC | PRN

## 2017-06-05 MED ORDER — CANAGLIFLOZIN 100 MG PO TABS
50.0000 mg | ORAL_TABLET | Freq: Every day | ORAL | Status: DC
  Administered 2017-06-06: 100 mg via ORAL
  Filled 2017-06-05: qty 1

## 2017-06-05 MED ORDER — METOCLOPRAMIDE HCL 5 MG/ML IJ SOLN
5.0000 mg | Freq: Three times a day (TID) | INTRAMUSCULAR | Status: DC | PRN
  Administered 2017-06-05: 22:00:00 10 mg via INTRAVENOUS
  Filled 2017-06-05: qty 2

## 2017-06-05 MED ORDER — VALSARTAN-HYDROCHLOROTHIAZIDE 320-25 MG PO TABS: 1.0000 | ORAL_TABLET | Freq: Every morning | ORAL | Status: DC

## 2017-06-05 MED ORDER — SODIUM CHLORIDE 0.9 % IV SOLN
INTRAVENOUS | Status: DC
  Administered 2017-06-05 – 2017-06-06 (×2): via INTRAVENOUS

## 2017-06-05 MED ORDER — PHENYLEPHRINE 40 MCG/ML (10ML) SYRINGE FOR IV PUSH (FOR BLOOD PRESSURE SUPPORT)
PREFILLED_SYRINGE | INTRAVENOUS | Status: AC
  Filled 2017-06-05: qty 10

## 2017-06-05 MED ORDER — STERILE WATER FOR IRRIGATION IR SOLN
Status: DC | PRN
  Administered 2017-06-05: 2000 mL

## 2017-06-05 MED ORDER — MIDAZOLAM HCL 2 MG/2ML IJ SOLN
INTRAMUSCULAR | Status: AC
  Filled 2017-06-05: qty 2

## 2017-06-05 MED ORDER — DEXAMETHASONE SODIUM PHOSPHATE 10 MG/ML IJ SOLN
INTRAMUSCULAR | Status: AC
  Filled 2017-06-05: qty 1

## 2017-06-05 MED ORDER — INSULIN ASPART 100 UNIT/ML ~~LOC~~ SOLN
0.0000 [IU] | Freq: Three times a day (TID) | SUBCUTANEOUS | Status: DC
  Administered 2017-06-05: 18:00:00 3 [IU] via SUBCUTANEOUS
  Administered 2017-06-06: 2 [IU] via SUBCUTANEOUS

## 2017-06-05 MED ORDER — BUPIVACAINE HCL (PF) 0.25 % IJ SOLN
INTRAMUSCULAR | Status: DC | PRN
  Administered 2017-06-05: 30 mL

## 2017-06-05 MED ORDER — IPRATROPIUM BROMIDE 0.03 % NA SOLN
1.0000 | Freq: Every evening | NASAL | Status: DC | PRN
  Filled 2017-06-05: qty 30

## 2017-06-05 MED ORDER — PROPOFOL 500 MG/50ML IV EMUL
INTRAVENOUS | Status: DC | PRN
  Administered 2017-06-05: 50 ug/kg/min via INTRAVENOUS

## 2017-06-05 MED ORDER — ONDANSETRON HCL 4 MG/2ML IJ SOLN
4.0000 mg | Freq: Four times a day (QID) | INTRAMUSCULAR | Status: DC | PRN
  Administered 2017-06-05: 4 mg via INTRAVENOUS
  Filled 2017-06-05: qty 2

## 2017-06-05 MED ORDER — HYDROMORPHONE HCL 1 MG/ML IJ SOLN
INTRAMUSCULAR | Status: AC
  Filled 2017-06-05: qty 1

## 2017-06-05 MED ORDER — HYDROCHLOROTHIAZIDE 25 MG PO TABS
25.0000 mg | ORAL_TABLET | Freq: Every day | ORAL | Status: DC
  Administered 2017-06-06: 25 mg via ORAL
  Filled 2017-06-05: qty 1

## 2017-06-05 MED ORDER — VALSARTAN 160 MG PO TABS
320.0000 mg | ORAL_TABLET | Freq: Every day | ORAL | Status: DC
  Administered 2017-06-06: 11:00:00 320 mg via ORAL
  Filled 2017-06-05: qty 2

## 2017-06-05 MED ORDER — DEXAMETHASONE SODIUM PHOSPHATE 10 MG/ML IJ SOLN
10.0000 mg | Freq: Once | INTRAMUSCULAR | Status: AC
  Administered 2017-06-06: 10 mg via INTRAVENOUS
  Filled 2017-06-05: qty 1

## 2017-06-05 MED ORDER — TRANEXAMIC ACID 1000 MG/10ML IV SOLN
1000.0000 mg | Freq: Once | INTRAVENOUS | Status: AC
  Administered 2017-06-05: 16:00:00 1000 mg via INTRAVENOUS
  Filled 2017-06-05: qty 1100

## 2017-06-05 MED ORDER — LACTATED RINGERS IV SOLN
INTRAVENOUS | Status: DC
  Administered 2017-06-05 (×3): via INTRAVENOUS

## 2017-06-05 MED ORDER — ACETAMINOPHEN 325 MG PO TABS: 650.0000 mg | ORAL_TABLET | ORAL | Status: DC | PRN

## 2017-06-05 MED ORDER — FENTANYL CITRATE (PF) 100 MCG/2ML IJ SOLN
INTRAMUSCULAR | Status: DC | PRN
  Administered 2017-06-05 (×2): 50 ug via INTRAVENOUS

## 2017-06-05 MED ORDER — OXYCODONE HCL 5 MG PO TABS
10.0000 mg | ORAL_TABLET | ORAL | Status: DC | PRN
  Administered 2017-06-05 – 2017-06-06 (×5): 10 mg via ORAL
  Filled 2017-06-05 (×6): qty 2

## 2017-06-05 MED ORDER — BISACODYL 10 MG RE SUPP: 10.0000 mg | Freq: Every day | RECTAL | Status: DC | PRN

## 2017-06-05 MED ORDER — MEPERIDINE HCL 50 MG/ML IJ SOLN: 6.2500 mg | INTRAMUSCULAR | Status: DC | PRN

## 2017-06-05 MED ORDER — RIVAROXABAN 10 MG PO TABS
10.0000 mg | ORAL_TABLET | Freq: Every day | ORAL | Status: DC
  Administered 2017-06-06: 10 mg via ORAL
  Filled 2017-06-05: qty 1

## 2017-06-05 MED ORDER — AMLODIPINE BESYLATE 10 MG PO TABS
10.0000 mg | ORAL_TABLET | Freq: Every morning | ORAL | Status: DC
  Administered 2017-06-06: 11:00:00 10 mg via ORAL
  Filled 2017-06-05: qty 1

## 2017-06-05 MED ORDER — METHOCARBAMOL 1000 MG/10ML IJ SOLN
500.0000 mg | Freq: Four times a day (QID) | INTRAVENOUS | Status: DC | PRN
  Administered 2017-06-05: 500 mg via INTRAVENOUS
  Filled 2017-06-05: qty 550

## 2017-06-05 MED ORDER — PROPOFOL 10 MG/ML IV BOLUS
INTRAVENOUS | Status: AC
  Filled 2017-06-05: qty 20

## 2017-06-05 MED ORDER — METHOCARBAMOL 500 MG PO TABS
500.0000 mg | ORAL_TABLET | Freq: Four times a day (QID) | ORAL | Status: DC | PRN
  Administered 2017-06-05 – 2017-06-06 (×3): 500 mg via ORAL
  Filled 2017-06-05 (×3): qty 1

## 2017-06-05 MED ORDER — BUPIVACAINE HCL (PF) 0.25 % IJ SOLN
INTRAMUSCULAR | Status: AC
  Filled 2017-06-05: qty 30

## 2017-06-05 MED ORDER — DEXAMETHASONE SODIUM PHOSPHATE 10 MG/ML IJ SOLN
10.0000 mg | Freq: Once | INTRAMUSCULAR | Status: AC
  Administered 2017-06-05: 10 mg via INTRAVENOUS

## 2017-06-05 MED ORDER — PHENOL 1.4 % MT LIQD
1.0000 | OROMUCOSAL | Status: DC | PRN
  Filled 2017-06-05: qty 177

## 2017-06-05 MED ORDER — MONTELUKAST SODIUM 10 MG PO TABS
10.0000 mg | ORAL_TABLET | Freq: Every morning | ORAL | Status: DC
  Administered 2017-06-06: 10 mg via ORAL
  Filled 2017-06-05: qty 1

## 2017-06-05 MED ORDER — PROPOFOL 10 MG/ML IV BOLUS
INTRAVENOUS | Status: DC | PRN
  Administered 2017-06-05: 20 mg via INTRAVENOUS

## 2017-06-05 MED ORDER — ACETAMINOPHEN 10 MG/ML IV SOLN
1000.0000 mg | Freq: Once | INTRAVENOUS | Status: AC
  Administered 2017-06-05: 1000 mg via INTRAVENOUS
  Filled 2017-06-05: qty 100

## 2017-06-05 MED ORDER — 0.9 % SODIUM CHLORIDE (POUR BTL) OPTIME
TOPICAL | Status: DC | PRN
  Administered 2017-06-05: 1000 mL

## 2017-06-05 MED ORDER — BUPIVACAINE IN DEXTROSE 0.75-8.25 % IT SOLN
INTRATHECAL | Status: DC | PRN
  Administered 2017-06-05: 15 mg via INTRATHECAL

## 2017-06-05 MED ORDER — ACETAMINOPHEN 650 MG RE SUPP: 650.0000 mg | RECTAL | Status: DC | PRN

## 2017-06-05 MED ORDER — ONDANSETRON HCL 4 MG/2ML IJ SOLN
INTRAMUSCULAR | Status: AC
  Filled 2017-06-05: qty 2

## 2017-06-05 MED ORDER — METOCLOPRAMIDE HCL 5 MG PO TABS: 5.0000 mg | ORAL_TABLET | Freq: Three times a day (TID) | ORAL | Status: DC | PRN

## 2017-06-05 MED ORDER — ONDANSETRON HCL 4 MG PO TABS: 4.0000 mg | ORAL_TABLET | Freq: Four times a day (QID) | ORAL | Status: DC | PRN

## 2017-06-05 MED ORDER — MIDAZOLAM HCL 2 MG/2ML IJ SOLN
1.0000 mg | Freq: Once | INTRAMUSCULAR | Status: AC
  Administered 2017-06-05: 1 mg via INTRAVENOUS

## 2017-06-05 MED ORDER — FENTANYL CITRATE (PF) 100 MCG/2ML IJ SOLN
25.0000 ug | INTRAMUSCULAR | Status: DC | PRN
  Administered 2017-06-05 (×2): 50 ug via INTRAVENOUS

## 2017-06-05 MED ORDER — FLEET ENEMA 7-19 GM/118ML RE ENEM: 1.0000 | ENEMA | Freq: Once | RECTAL | Status: DC | PRN

## 2017-06-05 MED ORDER — CEFAZOLIN SODIUM-DEXTROSE 2-4 GM/100ML-% IV SOLN
2.0000 g | Freq: Four times a day (QID) | INTRAVENOUS | Status: AC
  Administered 2017-06-05 – 2017-06-06 (×2): 2 g via INTRAVENOUS
  Filled 2017-06-05 (×2): qty 100

## 2017-06-05 MED ORDER — CHLORHEXIDINE GLUCONATE 4 % EX LIQD: 60.0000 mL | Freq: Once | CUTANEOUS | Status: DC

## 2017-06-05 MED ORDER — DIPHENHYDRAMINE HCL 12.5 MG/5ML PO ELIX: 12.5000 mg | ORAL_SOLUTION | ORAL | Status: DC | PRN

## 2017-06-05 MED ORDER — ACETAMINOPHEN 500 MG PO TABS
1000.0000 mg | ORAL_TABLET | Freq: Four times a day (QID) | ORAL | Status: DC
  Administered 2017-06-06 (×3): 1000 mg via ORAL
  Filled 2017-06-05 (×3): qty 2

## 2017-06-05 MED ORDER — ONDANSETRON HCL 4 MG/2ML IJ SOLN
INTRAMUSCULAR | Status: DC | PRN
  Administered 2017-06-05: 4 mg via INTRAVENOUS

## 2017-06-05 MED ORDER — HYDROMORPHONE HCL 1 MG/ML IJ SOLN
0.2500 mg | INTRAMUSCULAR | Status: DC | PRN
  Administered 2017-06-05 (×4): 0.5 mg via INTRAVENOUS

## 2017-06-05 MED ORDER — OXYCODONE HCL 5 MG PO TABS: 5.0000 mg | ORAL_TABLET | ORAL | Status: DC | PRN

## 2017-06-05 MED ORDER — NEBIVOLOL HCL 10 MG PO TABS
20.0000 mg | ORAL_TABLET | Freq: Every day | ORAL | Status: DC
  Administered 2017-06-06: 11:00:00 20 mg via ORAL
  Filled 2017-06-05: qty 2

## 2017-06-05 MED ORDER — DOCUSATE SODIUM 100 MG PO CAPS
100.0000 mg | ORAL_CAPSULE | Freq: Two times a day (BID) | ORAL | Status: DC
  Administered 2017-06-05 – 2017-06-06 (×2): 100 mg via ORAL
  Filled 2017-06-05 (×2): qty 1

## 2017-06-05 MED ORDER — MORPHINE SULFATE (PF) 2 MG/ML IV SOLN
1.0000 mg | INTRAVENOUS | Status: DC | PRN
  Administered 2017-06-05: 16:00:00 1 mg via INTRAVENOUS
  Filled 2017-06-05: qty 1

## 2017-06-05 MED ORDER — FENTANYL CITRATE (PF) 100 MCG/2ML IJ SOLN
INTRAMUSCULAR | Status: AC
  Filled 2017-06-05: qty 2

## 2017-06-05 SURGICAL SUPPLY — 35 items
BAG DECANTER FOR FLEXI CONT (MISCELLANEOUS) IMPLANT
BAG ZIPLOCK 12X15 (MISCELLANEOUS) IMPLANT
BLADE SAG 18X100X1.27 (BLADE) ×3 IMPLANT
CAPT HIP TOTAL 2 ×3 IMPLANT
CLOSURE WOUND 1/2 X4 (GAUZE/BANDAGES/DRESSINGS) ×1
CLOTH BEACON ORANGE TIMEOUT ST (SAFETY) ×3 IMPLANT
COVER PERINEAL POST (MISCELLANEOUS) ×3 IMPLANT
COVER SURGICAL LIGHT HANDLE (MISCELLANEOUS) ×3 IMPLANT
DECANTER SPIKE VIAL GLASS SM (MISCELLANEOUS) ×3 IMPLANT
DRAPE STERI IOBAN 125X83 (DRAPES) ×3 IMPLANT
DRAPE U-SHAPE 47X51 STRL (DRAPES) ×6 IMPLANT
DRSG ADAPTIC 3X8 NADH LF (GAUZE/BANDAGES/DRESSINGS) ×3 IMPLANT
DRSG EMULSION OIL 3X16 NADH (GAUZE/BANDAGES/DRESSINGS) ×3 IMPLANT
DRSG MEPILEX BORDER 4X4 (GAUZE/BANDAGES/DRESSINGS) ×3 IMPLANT
DRSG MEPILEX BORDER 4X8 (GAUZE/BANDAGES/DRESSINGS) ×3 IMPLANT
DURAPREP 26ML APPLICATOR (WOUND CARE) ×3 IMPLANT
ELECT REM PT RETURN 15FT ADLT (MISCELLANEOUS) ×3 IMPLANT
EVACUATOR 1/8 PVC DRAIN (DRAIN) ×3 IMPLANT
GLOVE BIO SURGEON STRL SZ7.5 (GLOVE) ×3 IMPLANT
GLOVE BIO SURGEON STRL SZ8 (GLOVE) ×6 IMPLANT
GLOVE BIOGEL PI IND STRL 8 (GLOVE) ×2 IMPLANT
GLOVE BIOGEL PI INDICATOR 8 (GLOVE) ×4
GOWN STRL REUS W/TWL LRG LVL3 (GOWN DISPOSABLE) ×3 IMPLANT
GOWN STRL REUS W/TWL XL LVL3 (GOWN DISPOSABLE) ×9 IMPLANT
PACK ANTERIOR HIP CUSTOM (KITS) ×3 IMPLANT
STRIP CLOSURE SKIN 1/2X4 (GAUZE/BANDAGES/DRESSINGS) ×2 IMPLANT
SUT ETHIBOND NAB CT1 #1 30IN (SUTURE) ×3 IMPLANT
SUT MNCRL AB 4-0 PS2 18 (SUTURE) ×3 IMPLANT
SUT STRATAFIX 0 PDS 27 VIOLET (SUTURE) ×6
SUT VIC AB 2-0 CT1 27 (SUTURE) ×4
SUT VIC AB 2-0 CT1 TAPERPNT 27 (SUTURE) ×2 IMPLANT
SUTURE STRATFX 0 PDS 27 VIOLET (SUTURE) ×2 IMPLANT
SYR 50ML LL SCALE MARK (SYRINGE) IMPLANT
TRAY FOLEY W/METER SILVER 16FR (SET/KITS/TRAYS/PACK) ×3 IMPLANT
YANKAUER SUCT BULB TIP 10FT TU (MISCELLANEOUS) ×3 IMPLANT

## 2017-06-05 NOTE — Interval H&P Note (Signed)
History and Physical Interval Note:  06/05/2017 10:00 AM  Tommy [name]  has presented today for surgery, with the diagnosis of Osteoarthritis Right Hip  The various methods of treatment have been discussed with the patient and family. After consideration of risks, benefits and other options for treatment, the patient has consented to  Procedure(s) with comments: RIGHT TOTAL HIP ARTHROPLASTY ANTERIOR APPROACH (Right) - Dr. requested 120 minutes as a surgical intervention .  The patient's history has been reviewed, patient examined, no change in status, stable for surgery.  I have reviewed the patient's chart and labs.  Questions were answered to the patient's satisfaction.     Pilar Plate Jemuel Laursen

## 2017-06-05 NOTE — Discharge Instructions (Addendum)
° °Dr. Frank Aluisio °Total Joint Specialist °[facility] °[address] °[place], [place] [address] °[phone] ° °ANTERIOR APPROACH TOTAL HIP REPLACEMENT POSTOPERATIVE DIRECTIONS ° ° °Hip Rehabilitation, Guidelines Following Surgery  °The results of a hip operation are greatly improved after range of motion and muscle strengthening exercises. Follow all safety measures which are given to protect your hip. If any of these exercises cause increased pain or swelling in your joint, decrease the amount until you are comfortable again. Then slowly increase the exercises. Call your caregiver if you have problems or questions.  ° °HOME CARE INSTRUCTIONS  °Remove items at home which could result in a fall. This includes throw rugs or furniture in walking pathways.  °· ICE to the affected hip every three hours for 30 minutes at a time and then as needed for pain and swelling.  Continue to use ice on the hip for pain and swelling from surgery. You may notice swelling that will progress down to the foot and ankle.  This is normal after surgery.  Elevate the leg when you are not up walking on it.   °· Continue to use the breathing machine which will help keep your temperature down.  It is common for your temperature to cycle up and down following surgery, especially at night when you are not up moving around and exerting yourself.  The breathing machine keeps your lungs expanded and your temperature down. ° ° °DIET °You may resume your previous home diet once your are discharged from the hospital. ° °DRESSING / WOUND CARE / SHOWERING °You may shower 3 days after surgery, but keep the wounds dry during showering.  You may use an occlusive plastic wrap (Press'n Seal for example), NO SOAKING/SUBMERGING IN THE BATHTUB.  If the bandage gets wet, change with a clean dry gauze.  If the incision gets wet, pat the wound dry with a clean towel. °You may start showering once you are discharged home but do not  submerge the incision under water. Just pat the incision dry and apply a dry gauze dressing on daily. °Change the surgical dressing daily and reapply a dry dressing each time. ° °ACTIVITY °Walk with your walker as instructed. °Use walker as long as suggested by your caregivers. °Avoid periods of inactivity such as sitting longer than an hour when not asleep. This helps prevent blood clots.  °You may resume a sexual relationship in one month or when given the OK by your doctor.  °You may return to work once you are cleared by your doctor.  °Do not drive a car for 6 weeks or until released by you surgeon.  °Do not drive while taking narcotics. ° °WEIGHT BEARING °Weight bearing as tolerated with assist device (walker, cane, etc) as directed, use it as long as suggested by your surgeon or therapist, typically at least 4-6 weeks. ° °POSTOPERATIVE CONSTIPATION PROTOCOL °Constipation - defined medically as fewer than three stools per week and severe constipation as less than one stool per week. ° °One of the most common issues patients have following surgery is constipation.  Even if you have a regular bowel pattern at home, your normal regimen is likely to be disrupted due to multiple reasons following surgery.  Combination of anesthesia, postoperative narcotics, change in appetite and fluid intake all can affect your bowels.  In order to avoid complications following surgery, here are some recommendations in order to help you during your recovery period. ° °Colace (docusate) - Pick up an over-the-counter   form of Colace or another stool softener and take twice a day as long as you are requiring postoperative pain medications.  Take with a full glass of water daily.  If you experience loose stools or diarrhea, hold the colace until you stool forms back up.  If your symptoms do not get better within 1 week or if they get worse, check with your doctor. ° °Dulcolax (bisacodyl) - Pick up over-the-counter and take as directed  by the product packaging as needed to assist with the movement of your bowels.  Take with a full glass of water.  Use this product as needed if not relieved by Colace only.  ° °MiraLax (polyethylene glycol) - Pick up over-the-counter to have on hand.  MiraLax is a solution that will increase the amount of water in your bowels to assist with bowel movements.  Take as directed and can mix with a glass of water, juice, soda, coffee, or tea.  Take if you go more than two days without a movement. °Do not use MiraLax more than once per day. Call your doctor if you are still constipated or irregular after using this medication for 7 days in a row. ° °If you continue to have problems with postoperative constipation, please contact the office for further assistance and recommendations.  If you experience "the worst abdominal pain ever" or develop nausea or vomiting, please contact the office immediatly for further recommendations for treatment. ° °ITCHING ° If you experience itching with your medications, try taking only a single pain pill, or even half a pain pill at a time.  You can also use Benadryl over the counter for itching or also to help with sleep.  ° °TED HOSE STOCKINGS °Wear the elastic stockings on both legs for three weeks following surgery during the day but you may remove then at night for sleeping. ° °MEDICATIONS °See your medication summary on the “After Visit Summary” that the nursing staff will review with you prior to discharge.  You may have some home medications which will be placed on hold until you complete the course of blood thinner medication.  It is important for you to complete the blood thinner medication as prescribed by your surgeon.  Continue your approved medications as instructed at time of discharge. ° °PRECAUTIONS °If you experience chest pain or shortness of breath - call 911 immediately for transfer to the hospital emergency department.  °If you develop a fever greater that 101 F,  purulent drainage from wound, increased redness or drainage from wound, foul odor from the wound/dressing, or calf pain - CONTACT YOUR SURGEON.   °                                                °FOLLOW-UP APPOINTMENTS °Make sure you keep all of your appointments after your operation with your surgeon and caregivers. You should call the office at the above phone number and make an appointment for approximately two weeks after the date of your surgery or on the date instructed by your surgeon outlined in the "After Visit Summary". ° °RANGE OF MOTION AND STRENGTHENING EXERCISES  °These exercises are designed to help you keep full movement of your hip joint. Follow your caregiver's or physical therapist's instructions. Perform all exercises about fifteen times, three times per day or as directed. Exercise both hips, even if you   have had only one joint replacement. These exercises can be done on a training (exercise) mat, on the floor, on a table or on a bed. Use whatever works the best and is most comfortable for you. Use music or television while you are exercising so that the exercises are a pleasant break in your day. This will make your life better with the exercises acting as a break in routine you can look forward to.  Lying on your back, slowly slide your foot toward your buttocks, raising your knee up off the floor. Then slowly slide your foot back down until your leg is straight again.  Lying on your back spread your legs as far apart as you can without causing discomfort.  Lying on your side, raise your upper leg and foot straight up from the floor as far as is comfortable. Slowly lower the leg and repeat.  Lying on your back, tighten up the muscle in the front of your thigh (quadriceps muscles). You can do this by keeping your leg straight and trying to raise your heel off the floor. This helps strengthen the largest muscle supporting your knee.  Lying on your back, tighten up the muscles of your  buttocks both with the legs straight and with the knee bent at a comfortable angle while keeping your heel on the floor.   IF YOU ARE TRANSFERRED TO A SKILLED REHAB FACILITY If the patient is transferred to a skilled rehab facility following release from the hospital, a list of the current medications will be sent to the facility for the patient to continue.  When discharged from the skilled rehab facility, please have the facility set up the patient's [place] prior to being released. Also, the skilled facility will be responsible for providing the patient with their medications at time of release from the facility to include their pain medication, the muscle relaxants, and their blood thinner medication. If the patient is still at the rehab facility at time of the two week follow up appointment, the skilled rehab facility will also need to assist the patient in arranging follow up appointment in our office and any transportation needs.  MAKE SURE YOU:  Understand these instructions.  Get help right away if you are not doing well or get worse.    Pick up stool softner and laxative for home use following surgery while on pain medications. Do not submerge incision under water. Please use good hand washing techniques while changing dressing each day. May shower starting three days after surgery. Please use a clean towel to pat the incision dry following showers. Continue to use ice for pain and swelling after surgery. Do not use any lotions or creams on the incision until instructed by your surgeon.  Information on my medicine - XARELTO (Rivaroxaban)   Why was Xarelto prescribed for you? Xarelto was prescribed for you to reduce the risk of blood clots forming after orthopedic surgery. The medical term for these abnormal blood clots is venous thromboembolism (VTE).  What do you need to know about xarelto ? Take your Xarelto ONCE DAILY at the same time every day. You  may take it either with or without food.  If you have difficulty swallowing the tablet whole, you may crush it and mix in applesauce just prior to taking your dose.  Take Xarelto exactly as prescribed by your doctor and DO NOT stop taking Xarelto without talking to the doctor who prescribed the medication.  Stopping without other VTE  prevention medication to take the place of Xarelto may increase your risk of developing a clot.  After discharge, you should have regular check-up appointments with your healthcare provider that is prescribing your Xarelto.    What do you do if you miss a dose? If you miss a dose, take it as soon as you remember on the same day then continue your regularly scheduled once daily regimen the next day. Do not take two doses of Xarelto on the same day.   Important Safety Information A possible side effect of Xarelto is bleeding. You should call your healthcare provider right away if you experience any of the following: ? Bleeding from an injury or your nose that does not stop. ? Unusual colored urine (red or dark brown) or unusual colored stools (red or black). ? Unusual bruising for unknown reasons. ? A serious fall or if you hit your head (even if there is no bleeding).  Some medicines may interact with Xarelto and might increase your risk of bleeding while on Xarelto. To help avoid this, consult your healthcare provider or pharmacist prior to using any new prescription or non-prescription medications, including herbals, vitamins, non-steroidal anti-inflammatory drugs (NSAIDs) and supplements.  This website has more information on Xarelto: [email or web address]  Take Xarelto for two and a half more weeks following discharge from the hospital, then discontinue Xarelto. Once the patient has completed the Xarelto, they may resume the 81 mg Aspirin.

## 2017-06-05 NOTE — Transfer of Care (Signed)
Immediate Anesthesia Transfer of Care Note  Patient: Tommy [name]  Procedure(s) Performed: RIGHT TOTAL HIP ARTHROPLASTY ANTERIOR APPROACH (Right Hip)  Patient Location: PACU  Anesthesia Type:Spinal  Level of Consciousness: drowsy and patient cooperative  Airway & Oxygen Therapy: Patient Spontanous Breathing and Patient connected to face mask oxygen  Post-op Assessment: Report given to RN and Post -op Vital signs reviewed and stable  Post vital signs: Reviewed and stable  Last Vitals:  Vitals:   06/05/17 0929 06/05/17 0930  BP:  (!) 150/90  Pulse: (!) 57   Resp: 18   Temp: 36.8 C   SpO2: 95%     Last Pain:  Vitals:   06/05/17 0929  TempSrc: Oral      Patients Stated Pain Goal: 4 (61/22/44 9753)  Complications: No apparent anesthesia complications

## 2017-06-05 NOTE — Anesthesia Procedure Notes (Signed)
Spinal  Patient location during procedure: OR Start time: 06/05/2017 11:17 AM End time: 06/05/2017 11:22 AM Staffing Anesthesiologist: Janeece Riggers, MD Preanesthetic Checklist Completed: patient identified, site marked, surgical consent, pre-op evaluation, timeout performed, IV checked, risks and benefits discussed and monitors and equipment checked Spinal Block Patient position: sitting Prep: DuraPrep Patient monitoring: heart rate, cardiac monitor, continuous pulse ox and blood pressure Approach: left paramedian Location: L3-4 Injection technique: single-shot Needle Needle type: Sprotte  Needle gauge: 24 G Needle length: 9 cm Assessment Sensory level: T4

## 2017-06-05 NOTE — Op Note (Signed)
OPERATIVE REPORT- TOTAL HIP ARTHROPLASTY   PREOPERATIVE DIAGNOSIS: Osteoarthritis of the Right hip.   POSTOPERATIVE DIAGNOSIS: Osteoarthritis of the Right  hip.   PROCEDURE: Right total hip arthroplasty, anterior approach.   SURGEON: Gaynelle Arabian, MD   ASSISTANT: Arlee Muslim, PA-C  ANESTHESIA:  Spinal  ESTIMATED BLOOD LOSS:-600 mL    DRAINS: Hemovac x1.   COMPLICATIONS: None   CONDITION: PACU - hemodynamically stable.   BRIEF CLINICAL NOTE: Tommy [name] is a 72 y.o. male who has advanced end-  stage arthritis of their Right  hip with progressively worsening pain and  dysfunction.The patient has failed nonoperative management and presents for  total hip arthroplasty.   PROCEDURE IN DETAIL: After successful administration of spinal  anesthetic, the traction boots for the [facility] bed were placed on both  feet and the patient was placed onto the [facility] bed, boots placed into the leg  holders. The Right hip was then isolated from the perineum with plastic  drapes and prepped and draped in the usual sterile fashion. ASIS and  greater trochanter were marked and a oblique incision was made, starting  at about 1 cm lateral and 2 cm distal to the ASIS and coursing towards  the anterior cortex of the femur. The skin was cut with a 10 blade  through subcutaneous tissue to the level of the fascia overlying the  tensor fascia lata muscle. The fascia was then incised in line with the  incision at the junction of the anterior third and posterior 2/3rd. The  muscle was teased off the fascia and then the interval between the TFL  and the rectus was developed. The Hohmann retractor was then placed at  the top of the femoral neck over the capsule. The vessels overlying the  capsule were cauterized and the fat on top of the capsule was removed.  A Hohmann retractor was then placed anterior underneath the rectus  femoris to give exposure to the entire anterior capsule. A T-shaped   capsulotomy was performed. The edges were tagged and the femoral head  was identified.       Osteophytes are removed off the superior acetabulum.  The femoral neck was then cut in situ with an oscillating saw. Traction  was then applied to the left lower extremity utilizing the [facility]  traction. The femoral head was then removed. Retractors were placed  around the acetabulum and then circumferential removal of the labrum was  performed. Osteophytes were also removed. Reaming starts at 49 mm to  medialize and  Increased in 2 mm increments to 53 mm. We reamed in  approximately 40 degrees of abduction, 20 degrees anteversion. A 54 mm  pinnacle acetabular shell was then impacted in anatomic position under  fluoroscopic guidance with excellent purchase. We did not need to place  any additional dome screws. A 36 mm neutral + 4 marathon liner was then  placed into the acetabular shell.       The femoral lift was then placed along the lateral aspect of the femur  just distal to the vastus ridge. The leg was  externally rotated and capsule  was stripped off the inferior aspect of the femoral neck down to the  level of the lesser trochanter, this was done with electrocautery. The femur was lifted after this was performed. The  leg was then placed in an extended and adducted position essentially delivering the femur. We also removed the capsule superiorly and the piriformis from the piriformis  fossa to gain excellent exposure of the  proximal femur. Rongeur was used to remove some cancellous bone to get  into the lateral portion of the proximal femur for placement of the  initial starter reamer. The starter broaches was placed  the starter broach  and was shown to go down the center of the canal. Broaching  with the  Corail system was then performed starting at size 8, coursing  Up to size 13. A size 13 had excellent torsional and rotational  and axial stability. The trial high offset neck was then  placed  with a 36 + 1.5 trial head. The hip was then reduced. We confirmed that  the stem was in the canal both on AP and lateral x-rays. It also has excellent sizing. The hip was reduced with outstanding stability through full extension and full external rotation.. AP pelvis was taken and the leg lengths were measured and found to be equal. Hip was then dislocated again and the femoral head and neck removed. The  femoral broach was removed. Size 13 Corail stem with a high offset  neck was then impacted into the femur following native anteversion. Has  excellent purchase in the canal. Excellent torsional and rotational and  axial stability. It is confirmed to be in the canal on AP and lateral  fluoroscopic views. The 36 + 1.5 ceramic head was placed and the hip  reduced with outstanding stability. Again AP pelvis was taken and it  confirmed that the leg lengths were equal. The wound was then copiously  irrigated with saline solution and the capsule reattached and repaired  with Ethibond suture. 30 ml of .25% Bupivicaine was  injected into the capsule and into the edge of the tensor fascia lata as well as subcutaneous tissue. The fascia overlying the tensor fascia lata was then closed with a running #1 V-Loc. Subcu was closed with interrupted 2-0 Vicryl and subcuticular running 4-0 Monocryl. Incision was cleaned  and dried. Steri-Strips and a bulky sterile dressing applied. Hemovac  drain was hooked to suction and then the patient was awakened and transported to  recovery in stable condition.        Please note that a surgical assistant was a medical necessity for this procedure to perform it in a safe and expeditious manner. Assistant was necessary to provide appropriate retraction of vital neurovascular structures and to prevent femoral fracture and allow for anatomic placement of the prosthesis.  Gaynelle Arabian, M.D.

## 2017-06-05 NOTE — Anesthesia Postprocedure Evaluation (Signed)
Anesthesia Post Note  Patient: Dorathy Daft  Procedure(s) Performed: RIGHT TOTAL HIP ARTHROPLASTY ANTERIOR APPROACH (Right Hip)     Patient location during evaluation: PACU Anesthesia Type: Spinal Level of consciousness: oriented and awake and alert Pain management: pain level controlled Vital Signs Assessment: post-procedure vital signs reviewed and stable Respiratory status: spontaneous breathing, respiratory function stable and patient connected to nasal cannula oxygen Cardiovascular status: blood pressure returned to baseline and stable Postop Assessment: no headache, no backache and no apparent nausea or vomiting Anesthetic complications: no    Last Vitals:  Vitals:   06/05/17 1400 06/05/17 1415  BP: 106/86 (!) 145/118  Pulse: (!) 52 (!) 53  Resp: 14 17  Temp:    SpO2: 97% 93%    Last Pain:  Vitals:   06/05/17 1345  TempSrc:   PainSc: 8                  Hulan Szumski

## 2017-06-06 LAB — CBC
HEMATOCRIT: 41.8 % (ref 39.0–52.0)
Hemoglobin: 13.9 g/dL (ref 13.0–17.0)
MCH: 30 pg (ref 26.0–34.0)
MCHC: 33.3 g/dL (ref 30.0–36.0)
MCV: 90.3 fL (ref 78.0–100.0)
Platelets: 248 10*3/uL (ref 150–400)
RBC: 4.63 MIL/uL (ref 4.22–5.81)
RDW: 15.4 % (ref 11.5–15.5)
WBC: 17.2 10*3/uL — ABNORMAL HIGH (ref 4.0–10.5)

## 2017-06-06 LAB — BASIC METABOLIC PANEL
Anion gap: 10 (ref 5–15)
BUN: 15 mg/dL (ref 6–20)
CALCIUM: 9 mg/dL (ref 8.9–10.3)
CO2: 28 mmol/L (ref 22–32)
CREATININE: 0.72 mg/dL (ref 0.61–1.24)
Chloride: 101 mmol/L (ref 101–111)
GFR calc non Af Amer: >60 mL/min (ref >60)
GLUCOSE: 155 mg/dL — AB (ref 65–99)
Potassium: 3.6 mmol/L (ref 3.5–5.1)
Sodium: 139 mmol/L (ref 135–145)

## 2017-06-06 LAB — GLUCOSE, CAPILLARY
GLUCOSE-CAPILLARY: 88 mg/dL (ref 65–99)
Glucose-Capillary: 128 mg/dL — ABNORMAL HIGH (ref 65–99)

## 2017-06-06 MED ORDER — METHOCARBAMOL 500 MG PO TABS: 500.0000 mg | ORAL_TABLET | Freq: Four times a day (QID) | ORAL | 0 refills | Status: DC | PRN

## 2017-06-06 MED ORDER — RIVAROXABAN 10 MG PO TABS: 10.0000 mg | ORAL_TABLET | Freq: Every day | ORAL | 0 refills | Status: DC

## 2017-06-06 MED ORDER — OXYCODONE HCL 5 MG PO TABS: 5.0000 mg | ORAL_TABLET | ORAL | 0 refills | Status: DC | PRN

## 2017-06-06 NOTE — Progress Notes (Signed)
Subjective: 1 Day Post-Op Procedure(s) (LRB): RIGHT TOTAL HIP ARTHROPLASTY ANTERIOR APPROACH (Right) Patient reports pain as mild.   Patient seen in rounds with Dr. Wynelle Link. Patient is well, but has had some minor complaints of pain in the right hip, requiring pain medications We will start therapy today.  If they do well with therapy and meets all goals, then will allow home later this afternoon following therapy. Plan is to go Home after hospital stay.  Objective: Vital signs in last 24 hours: Temp:  [96.4 F (35.8 C)-98.5 F (36.9 C)] 98.3 F (36.8 C) (01/17 0637) Pulse Rate:  [51-74] 64 (01/17 0637) Resp:  [10-18] 17 (01/17 0637) BP: (106-151)/(75-118) 146/79 (01/17 0637) SpO2:  [91 %-99 %] 97 % (01/17 0637) Weight:  [137 kg (302 lb)] 137 kg (302 lb) (01/16 0952)  Intake/Output from previous day:  Intake/Output Summary (Last 24 hours) at 06/06/2017 0758 Last data filed at 06/06/2017 1610 Gross per 24 hour  Intake 5265 ml  Output 3595 ml  Net 1670 ml    Intake/Output this shift: No intake/output data recorded.  Labs: Recent Labs    06/06/17 0601  HGB 13.9   Recent Labs    06/06/17 0601  WBC 17.2*  RBC 4.63  HCT 41.8  PLT 248   Recent Labs    06/06/17 0601  NA 139  K 3.6  CL 101  CO2 28  BUN 15  CREATININE 0.72  GLUCOSE 155*  CALCIUM 9.0   No results for input(s): LABPT, INR in the last 72 hours.  EXAM General - Patient is Alert, Appropriate and Oriented Extremity - Neurovascular intact Sensation intact distally Dorsiflexion/Plantar flexion intact Dressing - dressing C/D/I Motor Function - intact, moving foot and toes well on exam.  Hemovac pulled without difficulty.  Past Medical History:  Diagnosis Date  . Allergy   . Anemia    "as teenager"  . Arthritis    hands, hip  . Back pain   . BPPV (benign paroxysmal positional vertigo)    History of  . Cancer ([place])    "melanoma removed from leg years ago", malignant mole  . Chronic back  pain   . Complication of anesthesia    woke up and was confused and combative after back surgery  . Constipation   . DDD (degenerative disc disease), lumbar   . Diabetes mellitus without complication ([place])   . Diabetic neuropathy ([place])   . ED (erectile dysfunction)   . Essential hypertension 10/16/2015  . Fatty liver   . First degree AV block    EKG 12/03/16  . GERD (gastroesophageal reflux disease)    diet controlled, no meds currently, history of  . H/O hiatal hernia   . History of elevated PSA   . Hypertension    sees Dr. Virgina Jock  . Nocturia more than twice per night 12/21/2013  . OA (osteoarthritis)   . Obesity hypoventilation syndrome ([place]) 04/07/2014  . Obesity, morbid ([place]) 12/21/2013  . OSA on CPAP 04/07/2014  . RBBB    previous EKG  . Sleep apnea    uses CPAP nightly  . Snoring 12/21/2013  . Spondylosis 2014   lumbar back, noted on MR   . Ulcer    stomach ulcer "as teenager"    Assessment/Plan: 1 Day Post-Op Procedure(s) (LRB): RIGHT TOTAL HIP ARTHROPLASTY ANTERIOR APPROACH (Right) Principal Problem:   OA (osteoarthritis) of hip  Estimated body mass index is 39.3 kg/m as calculated from the following:   Height as of  this encounter: 6' 1.5" (1.867 m).   Weight as of this encounter: 137 kg (302 lb). Up with therapy  DVT Prophylaxis - Xarelto Weight Bearing As Tolerated right Leg Hemovac Pulled Begin Therapy  If meets goals and able to go home: Up with therapy Diet - Cardiac diet Follow up - in 2 weeks Activity - WBAT Disposition - Home Condition Upon Discharge - Stable D/C Meds - See DC Summary DVT Prophylaxis - Xarelto  Arlee Muslim, PA-C Orthopaedic Surgery 06/06/2017, 7:58 AM

## 2017-06-06 NOTE — Evaluation (Signed)
Physical Therapy Evaluation Patient Details Name: Tommy [name] MRN: [id number] DOB: 20-Mar-1946 Today's Date: 06/06/2017   History of Present Illness  Pt s/p R THR and with hx of DM, DDD, and back surgery  Clinical Impression  Pt s/p R THR and presents with decreased R LE strength/ROM and post op pain limiting functional mobility.  Pt should progress well to dc home with family assist.    Follow Up Recommendations Home health PT;DC plan and follow up therapy as arranged by surgeon    Equipment Recommendations  None recommended by PT    Recommendations for Other Services       Precautions / Restrictions Precautions Precautions: Fall Restrictions Weight Bearing Restrictions: No Other Position/Activity Restrictions: WBAT      Mobility  Bed Mobility Overal bed mobility: Needs Assistance Bed Mobility: Supine to Sit;Sit to Supine     Supine to sit: Min assist Sit to supine: Min assist   General bed mobility comments: cues for sequence and use of L LE to self assist  Transfers Overall transfer level: Needs assistance Equipment used: Rolling walker (2 wheeled) Transfers: Sit to/from Stand Sit to Stand: Min assist;Min guard         General transfer comment: cues for LE management and use of UEs to self assist  Ambulation/Gait Ambulation/Gait assistance: Min assist;Min guard Ambulation Distance (Feet): 120 Feet Assistive device: Rolling walker (2 wheeled) Gait Pattern/deviations: Step-to pattern;Decreased step length - right;Decreased step length - left;Shuffle;Trunk flexed Gait velocity: decr Gait velocity interpretation: Below normal speed for age/gender General Gait Details: cues for posture, position from RW and initial sequence  Stairs            Wheelchair Mobility    Modified Rankin (Stroke Patients Only)       Balance                                             Pertinent Vitals/Pain Pain Assessment: 0-10 Pain Score:  5  Pain Location: R hip Pain Descriptors / Indicators: Sore;Tightness Pain Intervention(s): Limited activity within patient's tolerance;Monitored during session;Premedicated before session;Ice applied    Home Living Family/patient expects to be discharged to:: Private residence Living Arrangements: Spouse/significant other Available Help at Discharge: Family Type of Home: House Home Access: Level entry     Home Layout: One Rives: Environmental consultant - 2 wheels;Cane - single point;Bedside commode      Prior Function Level of Independence: Independent;Independent with assistive device(s)               Hand Dominance        Extremity/Trunk Assessment   Upper Extremity Assessment Upper Extremity Assessment: Overall WFL for tasks assessed    Lower Extremity Assessment Lower Extremity Assessment: RLE deficits/detail RLE Deficits / Details: Strength at hip 2+/5 with AAROM at hip to 85 flex and 20 abd    Cervical / Trunk Assessment Cervical / Trunk Assessment: Normal  Communication   Communication: No difficulties  Cognition Arousal/Alertness: Awake/alert Behavior During Therapy: WFL for tasks assessed/performed Overall Cognitive Status: Within Functional Limits for tasks assessed                                        General Comments      Exercises Total Joint Exercises Ankle  Circles/Pumps: AROM;Both;20 reps;Supine Quad Sets: AROM;Both;10 reps;Supine Heel Slides: AAROM;Right;20 reps;Supine Hip ABduction/ADduction: AAROM;Right;15 reps;Supine   Assessment/Plan    PT Assessment Patient needs continued PT services  PT Problem List Decreased range of motion;Decreased strength;Decreased activity tolerance;Decreased mobility;Decreased knowledge of use of DME;Obesity;Pain       PT Treatment Interventions DME instruction;Gait training;Functional mobility training;Therapeutic activities;Therapeutic exercise;Patient/family education    PT Goals  (Current goals can be found in the Care Plan section)  Acute Rehab PT Goals Patient Stated Goal: Regain IND PT Goal Formulation: With patient Time For Goal Achievement: 06/08/17 Potential to Achieve Goals: Good    Frequency 7X/week   Barriers to discharge        Co-evaluation               AM-PAC PT "6 Clicks" Daily Activity  Outcome Measure Difficulty turning over in bed (including adjusting bedclothes, sheets and blankets)?: Unable Difficulty moving from lying on back to sitting on the side of the bed? : Unable Difficulty sitting down on and standing up from a chair with arms (e.g., wheelchair, bedside commode, etc,.)?: Unable Help needed moving to and from a bed to chair (including a wheelchair)?: A Little Help needed walking in hospital room?: A Little Help needed climbing 3-5 steps with a railing? : A Little 6 Click Score: 12    End of Session Equipment Utilized During Treatment: Gait belt Activity Tolerance: Patient tolerated treatment well Patient left: in chair;with call bell/phone within reach Nurse Communication: Mobility status PT Visit Diagnosis: Pain;Difficulty in walking, not elsewhere classified (R26.2) Pain - Right/Left: Right Pain - part of body: Hip    Time: 0910-0949 PT Time Calculation (min) (ACUTE ONLY): 39 min   Charges:   PT Evaluation $PT Eval Low Complexity: 1 Low PT Treatments $Gait Training: 8-22 mins $Therapeutic Exercise: 8-22 mins   PT G Codes:        Pg [phone]   Nairi Oswald 06/06/2017, 3:19 PM

## 2017-06-06 NOTE — Discharge Summary (Signed)
Physician Discharge Summary   Patient ID: Tommy [name] MRN: [id number] DOB/AGE: [birth year] 72 y.o.  Admit date: 06/05/2017 Discharge date: 06-06-2017  Primary Diagnosis:  Osteoarthritis of the Right  hip.   Admission Diagnoses:  Past Medical History:  Diagnosis Date  . Allergy   . Anemia    "as teenager"  . Arthritis    hands, hip  . Back pain   . BPPV (benign paroxysmal positional vertigo)    History of  . Cancer ([place])    "melanoma removed from leg years ago", malignant mole  . Chronic back pain   . Complication of anesthesia    woke up and was confused and combative after back surgery  . Constipation   . DDD (degenerative disc disease), lumbar   . Diabetes mellitus without complication ([place])   . Diabetic neuropathy ([place])   . ED (erectile dysfunction)   . Essential hypertension 10/16/2015  . Fatty liver   . First degree AV block    EKG 12/03/16  . GERD (gastroesophageal reflux disease)    diet controlled, no meds currently, history of  . H/O hiatal hernia   . History of elevated PSA   . Hypertension    sees Dr. Virgina Jock  . Nocturia more than twice per night 12/21/2013  . OA (osteoarthritis)   . Obesity hypoventilation syndrome ([place]) 04/07/2014  . Obesity, morbid ([place]) 12/21/2013  . OSA on CPAP 04/07/2014  . RBBB    previous EKG  . Sleep apnea    uses CPAP nightly  . Snoring 12/21/2013  . Spondylosis 2014   lumbar back, noted on MR   . Ulcer    stomach ulcer "as teenager"   Discharge Diagnoses:   Principal Problem:   OA (osteoarthritis) of hip  Estimated body mass index is 39.3 kg/m as calculated from the following:   Height as of this encounter: 6' 1.5" (1.867 m).   Weight as of this encounter: 137 kg (302 lb).  Procedure(s) (LRB): RIGHT TOTAL HIP ARTHROPLASTY ANTERIOR APPROACH (Right)   Consults: None  HPI: Tommy [name] is a 72 y.o. male who has advanced end-  stage arthritis of their Right  hip with progressively worsening pain and    dysfunction.The patient has failed nonoperative management and presents for  total hip arthroplasty.    Laboratory Data: Admission on 06/05/2017  Component Date Value Ref Range Status  . Glucose-Capillary 06/05/2017 96  65 - 99 mg/dL Final  . Comment 1 06/05/2017 Notify RN   Final  . Glucose-Capillary 06/05/2017 109* 65 - 99 mg/dL Final  . Comment 1 06/05/2017 Notify RN   Final  . WBC 06/06/2017 17.2* 4.0 - 10.5 K/uL Final  . RBC 06/06/2017 4.63  4.22 - 5.81 MIL/uL Final  . Hemoglobin 06/06/2017 13.9  13.0 - 17.0 g/dL Final  . HCT 06/06/2017 41.8  39.0 - 52.0 % Final  . MCV 06/06/2017 90.3  78.0 - 100.0 fL Final  . MCH 06/06/2017 30.0  26.0 - 34.0 pg Final  . MCHC 06/06/2017 33.3  30.0 - 36.0 g/dL Final  . RDW 06/06/2017 15.4  11.5 - 15.5 % Final  . Platelets 06/06/2017 248  150 - 400 K/uL Final  . Sodium 06/06/2017 139  135 - 145 mmol/L Final  . Potassium 06/06/2017 3.6  3.5 - 5.1 mmol/L Final  . Chloride 06/06/2017 101  101 - 111 mmol/L Final  . CO2 06/06/2017 28  22 - 32 mmol/L Final  . Glucose, Bld 06/06/2017 155* 65 -  99 mg/dL Final  . BUN 06/06/2017 15  6 - 20 mg/dL Final  . Creatinine, Ser 06/06/2017 0.72  0.61 - 1.24 mg/dL Final  . Calcium 06/06/2017 9.0  8.9 - 10.3 mg/dL Final  . GFR calc non Af Amer 06/06/2017 >60  >60 mL/min Final  . GFR calc Af Amer 06/06/2017 >60  >60 mL/min Final   Comment: (NOTE) The eGFR has been calculated using the CKD EPI equation. This calculation has not been validated in all clinical situations. eGFR's persistently <60 mL/min signify possible Chronic Kidney Disease.   . Anion gap 06/06/2017 10  5 - 15 Final  . Glucose-Capillary 06/05/2017 167* 65 - 99 mg/dL Final  . Glucose-Capillary 06/05/2017 155* 65 - 99 mg/dL Final  . Glucose-Capillary 06/06/2017 128* 65 - 99 mg/dL Final  Hospital Outpatient Visit on 05/30/2017  Component Date Value Ref Range Status  . aPTT 05/30/2017 33  24 - 36 seconds Final  . WBC 05/30/2017 7.4  4.0 - 10.5  K/uL Final  . RBC 05/30/2017 5.07  4.22 - 5.81 MIL/uL Final  . Hemoglobin 05/30/2017 15.1  13.0 - 17.0 g/dL Final  . HCT 05/30/2017 45.3  39.0 - 52.0 % Final  . MCV 05/30/2017 89.3  78.0 - 100.0 fL Final  . MCH 05/30/2017 29.8  26.0 - 34.0 pg Final  . MCHC 05/30/2017 33.3  30.0 - 36.0 g/dL Final  . RDW 05/30/2017 15.4  11.5 - 15.5 % Final  . Platelets 05/30/2017 230  150 - 400 K/uL Final  . Sodium 05/30/2017 139  135 - 145 mmol/L Final  . Potassium 05/30/2017 3.5  3.5 - 5.1 mmol/L Final  . Chloride 05/30/2017 104  101 - 111 mmol/L Final  . CO2 05/30/2017 28  22 - 32 mmol/L Final  . Glucose, Bld 05/30/2017 95  65 - 99 mg/dL Final  . BUN 05/30/2017 10  6 - 20 mg/dL Final  . Creatinine, Ser 05/30/2017 0.68  0.61 - 1.24 mg/dL Final  . Calcium 05/30/2017 9.5  8.9 - 10.3 mg/dL Final  . Total Protein 05/30/2017 7.4  6.5 - 8.1 g/dL Final  . Albumin 05/30/2017 4.2  3.5 - 5.0 g/dL Final  . AST 05/30/2017 21  15 - 41 U/L Final  . ALT 05/30/2017 17  17 - 63 U/L Final  . Alkaline Phosphatase 05/30/2017 115  38 - 126 U/L Final  . Total Bilirubin 05/30/2017 1.2  0.3 - 1.2 mg/dL Final  . GFR calc non Af Amer 05/30/2017 >60  >60 mL/min Final  . GFR calc Af Amer 05/30/2017 >60  >60 mL/min Final   Comment: (NOTE) The eGFR has been calculated using the CKD EPI equation. This calculation has not been validated in all clinical situations. eGFR's persistently <60 mL/min signify possible Chronic Kidney Disease.   . Anion gap 05/30/2017 7  5 - 15 Final  . Prothrombin Time 05/30/2017 13.8  11.4 - 15.2 seconds Final  . INR 05/30/2017 1.07   Final  . ABO/RH(D) 05/30/2017 A POS   Final  . Antibody Screen 05/30/2017 NEG   Final  . Sample Expiration 05/30/2017 06/08/2017   Final  . Extend sample reason 05/30/2017 NO TRANSFUSIONS OR PREGNANCY IN THE PAST 3 MONTHS   Final  . MRSA, PCR 05/30/2017 NEGATIVE  NEGATIVE Final  . Staphylococcus aureus 05/30/2017 NEGATIVE  NEGATIVE Final   Comment: (NOTE) The Xpert  SA Assay (FDA approved for NASAL specimens in patients 58 years of age and older), is one component of a  comprehensive surveillance program. It is not intended to diagnose infection nor to guide or monitor treatment.   . Hgb A1c MFr Bld 05/30/2017 5.2  4.8 - 5.6 % Final   Comment: (NOTE) Pre diabetes:          5.7%-6.4% Diabetes:              >6.4% Glycemic control for   <7.0% adults with diabetes   . Mean Plasma Glucose 05/30/2017 102.54  mg/dL Final   Performed at [facility] [address] [place], [place] [address]  . Glucose-Capillary 05/30/2017 85  65 - 99 mg/dL Final  . ABO/RH(D) 05/30/2017 A POS   Final     X-Rays:Dg Pelvis Portable  Result Date: 06/05/2017 CLINICAL DATA:  Right hip replacement. EXAM: PORTABLE PELVIS 1-2 VIEWS COMPARISON:  Intraoperative films earlier today FINDINGS: Examination demonstrates evidence of patient's recent right total hip arthroplasty intact and in adequate position. There are mild degenerate changes of the left hip. IMPRESSION: Right total hip arthroplasty intact and in adequate position. Electronically Signed   By: Marin Olp M.D.   On: 06/05/2017 13:53   Dg C-arm 1-60 Min-no Report  Result Date: 06/05/2017 Fluoroscopy was utilized by the requesting physician.  No radiographic interpretation.    EKG: Orders placed or performed in visit on 12/03/16  . EKG 12-Lead     Hospital Course: Patient was admitted to [facility] and taken to the OR and underwent the above state procedure without complications.  Patient tolerated the procedure well and was later transferred to the recovery room and then to the orthopaedic floor for postoperative care.  They were given PO and IV analgesics for pain control following their surgery.  They were given 24 hours of postoperative antibiotics of  Anti-infectives (From admission, onward)   Start     Dose/Rate Route Frequency Ordered Stop   06/05/17 1800  ceFAZolin (ANCEF) IVPB 2g/100 mL  premix     2 g 200 mL/hr over 30 Minutes Intravenous Every 6 hours 06/05/17 1531 06/06/17 0041   06/05/17 0600  ceFAZolin (ANCEF) 3 g in dextrose 5 % 50 mL IVPB     3 g 130 mL/hr over 30 Minutes Intravenous On call to O.R. 06/04/17 1114 06/05/17 1125     and started on DVT prophylaxis in the form of Xarelto.   PT and OT were ordered for total hip protocol.  The patient was allowed to be WBAT with therapy. Discharge planning was consulted to help with postop disposition and equipment needs.  Patient had a good night on the evening of surgery.  They started to get up OOB with therapy on day one.  Hemovac drain was pulled without difficulty.  Dressing was checked and was clean an dry. Patient was seen in rounds, did well with therapy, and was ready to go home.  Diet - Cardiac diet Follow up - in 2 weeks Activity - WBAT Disposition - Home Condition Upon Discharge - Stable D/C Meds - See DC Summary DVT Prophylaxis - Xarelto     Discharge Instructions    Call MD / Call 911   Complete by:  As directed    If you experience chest pain or shortness of breath, CALL 911 and be transported to the hospital emergency room.  If you develope a fever above 101 F, pus (white drainage) or increased drainage or redness at the wound, or calf pain, call your surgeon's office.   Change dressing   Complete by:  As directed    You may change your dressing dressing daily with sterile 4 x 4 inch gauze dressing and paper tape.  Do not submerge the incision under water.   Constipation Prevention   Complete by:  As directed    Drink plenty of fluids.  Prune juice may be helpful.  You may use a stool softener, such as Colace (over the counter) 100 mg twice a day.  Use MiraLax (over the counter) for constipation as needed.   Diet - low sodium heart healthy   Complete by:  As directed    Diet Carb Modified   Complete by:  As directed    Discharge instructions   Complete by:  As directed    Take Xarelto for two and  a half more weeks, then discontinue Xarelto. Once the patient has completed the Xarelto, they may resume the 81 mg Aspirin.   Pick up stool softner and laxative for home use following surgery while on pain medications. Do not submerge incision under water. Please use good hand washing techniques while changing dressing each day. May shower starting three days after surgery. Please use a clean towel to pat the incision dry following showers. Continue to use ice for pain and swelling after surgery. Do not use any lotions or creams on the incision until instructed by your surgeon.  Wear both TED hose on both legs during the day every day for three weeks, but may remove the TED hose at night at home.  Postoperative Constipation Protocol  Constipation - defined medically as fewer than three stools per week and severe constipation as less than one stool per week.  One of the most common issues patients have following surgery is constipation.  Even if you have a regular bowel pattern at home, your normal regimen is likely to be disrupted due to multiple reasons following surgery.  Combination of anesthesia, postoperative narcotics, change in appetite and fluid intake all can affect your bowels.  In order to avoid complications following surgery, here are some recommendations in order to help you during your recovery period.  Colace (docusate) - Pick up an over-the-counter form of Colace or another stool softener and take twice a day as long as you are requiring postoperative pain medications.  Take with a full glass of water daily.  If you experience loose stools or diarrhea, hold the colace until you stool forms back up.  If your symptoms do not get better within 1 week or if they get worse, check with your doctor.  Dulcolax (bisacodyl) - Pick up over-the-counter and take as directed by the product packaging as needed to assist with the movement of your bowels.  Take with a full glass of water.  Use  this product as needed if not relieved by Colace only.   MiraLax (polyethylene glycol) - Pick up over-the-counter to have on hand.  MiraLax is a solution that will increase the amount of water in your bowels to assist with bowel movements.  Take as directed and can mix with a glass of water, juice, soda, coffee, or tea.  Take if you go more than two days without a movement. Do not use MiraLax more than once per day. Call your doctor if you are still constipated or irregular after using this medication for 7 days in a row.  If you continue to have problems with postoperative constipation, please contact the office for further assistance and recommendations.  If you experience "the worst abdominal pain ever" or  develop nausea or vomiting, please contact the office immediatly for further recommendations for treatment.   Do not sit on low chairs, stoools or toilet seats, as it may be difficult to get up from low surfaces   Complete by:  As directed    Driving restrictions   Complete by:  As directed    No driving until released by the physician.   Increase activity slowly as tolerated   Complete by:  As directed    Lifting restrictions   Complete by:  As directed    No lifting until released by the physician.   Patient may shower   Complete by:  As directed    You may shower without a dressing once there is no drainage.  Do not wash over the wound.  If drainage remains, do not shower until drainage stops.   TED hose   Complete by:  As directed    Use stockings (TED hose) for 3 weeks on both leg(s).  You may remove them at night for sleeping.   Weight bearing as tolerated   Complete by:  As directed      Allergies as of 06/06/2017      Reactions   Micardis [telmisartan] Hives, Rash      Medication List    STOP taking these medications   aspirin 81 MG tablet   Glucosamine-Chondroitin 750-600 MG Tabs   multivitamin tablet   naproxen 500 MG tablet Commonly known as:  NAPROSYN       TAKE these medications   amLODipine 10 MG tablet Commonly known as:  NORVASC Take 10 mg by mouth every morning.   BYSTOLIC 20 MG Tabs Generic drug:  Nebivolol HCl TAKE 1 TABLET BY MOUTH DAILY. What changed:    how much to take  how to take this  when to take this   FIBER PO Take 2 tablets by mouth every morning.   INVOKANA 100 MG Tabs tablet Generic drug:  canagliflozin Take 50 mg by mouth daily.   ipratropium 0.03 % nasal spray Commonly known as:  ATROVENT Place 1 spray into both nostrils at bedtime as needed for rhinitis.   metFORMIN 500 MG tablet Commonly known as:  GLUCOPHAGE Take 500 mg by mouth daily with breakfast.   methocarbamol 500 MG tablet Commonly known as:  ROBAXIN Take 1 tablet (500 mg total) by mouth every 6 (six) hours as needed for muscle spasms.   mometasone 50 MCG/ACT nasal spray Commonly known as:  NASONEX Place 2 sprays into the nose daily.   montelukast 10 MG tablet Commonly known as:  SINGULAIR Take 10 mg by mouth every morning.   oxyCODONE 5 MG immediate release tablet Commonly known as:  Oxy IR/ROXICODONE Take 1-2 tablets (5-10 mg total) by mouth every 4 (four) hours as needed for moderate pain or severe pain.   rivaroxaban 10 MG Tabs tablet Commonly known as:  XARELTO Take 1 tablet (10 mg total) by mouth daily with breakfast. Take Xarelto for two and a half more weeks following discharge from the hospital, then discontinue Xarelto. Once the patient has completed the Xarelto, they may resume the 81 mg Aspirin.   valsartan-hydrochlorothiazide 320-25 MG tablet Commonly known as:  DIOVAN-HCT Take 1 tablet by mouth every morning.            Durable Medical Equipment  (From admission, onward)        Start     Ordered   06/05/17 1534  For home use only DME continuous  positive airway pressure (CPAP)  Once    Question Answer Comment  Patient has OSA or probable OSA Yes   Is the patient currently using CPAP in the home Yes    Settings 11-15   CPAP supplies needed Mask, headgear, cushions, filters, heated tubing and water chamber      06/05/17 1534       Discharge Care Instructions  (From admission, onward)        Start     Ordered   06/06/17 0000  Weight bearing as tolerated     06/06/17 0806   06/06/17 0000  Change dressing    Comments:  You may change your dressing dressing daily with sterile 4 x 4 inch gauze dressing and paper tape.  Do not submerge the incision under water.   06/06/17 1859     Follow-up Information    Gaynelle Arabian, MD Follow up on 06/18/2017.   Specialty:  Orthopedic Surgery Contact information: [address] [place] [address] [phone]           Signed: Arlee Muslim, PA-C Orthopaedic Surgery 06/06/2017, 8:08 AM

## 2017-06-06 NOTE — Progress Notes (Signed)
Physical Therapy Treatment Patient Details Name: Tommy [name] MRN: [id number] DOB: [birth year] Today's Date: 06/06/2017    History of Present Illness Pt s/p R THR and with hx of DM, DDD, and back surgery    PT Comments    Pt motivated and progressing steadily with mobility.  Spouse present and reviewed home therex and car transfers.   Follow Up Recommendations  Home health PT;DC plan and follow up therapy as arranged by surgeon     Equipment Recommendations  None recommended by PT    Recommendations for Other Services       Precautions / Restrictions Precautions Precautions: Fall Restrictions Weight Bearing Restrictions: No Other Position/Activity Restrictions: WBAT    Mobility  Bed Mobility Overal bed mobility: Needs Assistance Bed Mobility: Supine to Sit;Sit to Supine     Supine to sit: Min assist Sit to supine: Min assist   General bed mobility comments: Pt up in chair and requests back to same  Transfers Overall transfer level: Needs assistance Equipment used: Rolling walker (2 wheeled) Transfers: Sit to/from Stand Sit to Stand: Min guard;Supervision         General transfer comment: cues for LE management and use of UEs to self assist  Ambulation/Gait Ambulation/Gait assistance: Min guard;Supervision Ambulation Distance (Feet): 120 Feet Assistive device: Rolling walker (2 wheeled) Gait Pattern/deviations: Decreased step length - right;Decreased step length - left;Shuffle;Trunk flexed;Step-to pattern;Step-through pattern Gait velocity: decr Gait velocity interpretation: Below normal speed for age/gender General Gait Details: cues for posture, position from RW and initial sequence   Stairs            Wheelchair Mobility    Modified Rankin (Stroke Patients Only)       Balance                                            Cognition Arousal/Alertness: Awake/alert Behavior During Therapy: WFL for tasks  assessed/performed Overall Cognitive Status: Within Functional Limits for tasks assessed                                        Exercises Total Joint Exercises Ankle Circles/Pumps: AROM;Both;20 reps;Supine Quad Sets: AROM;Both;10 reps;Supine Heel Slides: AAROM;Right;20 reps;Supine Hip ABduction/ADduction: AAROM;Right;15 reps;Supine Long Arc Quad: AROM;Right;10 reps;Seated    General Comments        Pertinent Vitals/Pain Pain Assessment: 0-10 Pain Score: 5  Pain Location: R hip Pain Descriptors / Indicators: Sore;Tightness Pain Intervention(s): Limited activity within patient's tolerance;Monitored during session;Premedicated before session;Ice applied    Home Living Family/patient expects to be discharged to:: Private residence Living Arrangements: Spouse/significant other Available Help at Discharge: Family Type of Home: House Home Access: Level entry   Home Layout: One [place]: Environmental consultant - 2 wheels;Cane - single point;Bedside commode      Prior Function Level of Independence: Independent;Independent with assistive device(s)          PT Goals (current goals can now be found in the care plan section) Acute Rehab PT Goals Patient Stated Goal: Regain IND PT Goal Formulation: With patient Time For Goal Achievement: 06/08/17 Potential to Achieve Goals: Good Progress towards PT goals: Progressing toward goals    Frequency    7X/week      PT Plan Current plan remains appropriate    Co-evaluation  AM-PAC PT "6 Clicks" Daily Activity  Outcome Measure  Difficulty turning over in bed (including adjusting bedclothes, sheets and blankets)?: Unable Difficulty moving from lying on back to sitting on the side of the bed? : Unable Difficulty sitting down on and standing up from a chair with arms (e.g., wheelchair, bedside commode, etc,.)?: A Lot Help needed moving to and from a bed to chair (including a wheelchair)?: A  Little Help needed walking in hospital room?: A Little Help needed climbing 3-5 steps with a railing? : A Little 6 Click Score: 13    End of Session Equipment Utilized During Treatment: Gait belt Activity Tolerance: Patient tolerated treatment well Patient left: in chair;with call bell/phone within reach Nurse Communication: Mobility status PT Visit Diagnosis: Pain;Difficulty in walking, not elsewhere classified (R26.2) Pain - Right/Left: Right Pain - part of body: Hip     Time: 2111-7356 PT Time Calculation (min) (ACUTE ONLY): 33 min  Charges:  $Gait Training: 8-22 mins $Therapeutic Exercise: 8-22 mins                    G Codes:       Pg [phone]    Tommy [name] 06/06/2017, 3:24 PM

## 2017-06-06 NOTE — Progress Notes (Addendum)
Discharge planning, spoke with patient and spouse at beside. Chose [facility] for [facility] services, PT to eval and treat. Contacted [facility] for referral. Has RW and 3-n-1. Discussed order for CPAP, patient already has CPAP, has been using his home machine while here. [phone]

## 2017-06-18 DIAGNOSIS — Z96641 Presence of right artificial hip joint: Secondary | ICD-10-CM | POA: Insufficient documentation

## 2017-08-03 IMAGING — NM NM MISC PROCEDURE
6 series · 36 of 36 positions shown · non-contrast
Comparison: none

[Series 1: wbr_s-proj_st stress · 6.51mm/px · 6 of 64 frames shown (1 of 2)]
[frame 6/64]
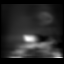
[frame 16/64]
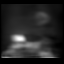
[frame 27/64]
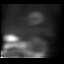
[frame 38/64]
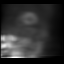
[frame 48/64]
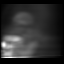
[frame 59/64]
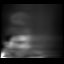

[Series 1: stress · 6.51mm/px · 6 of 64 frames shown (1 of 2)]
[frame 6/64]
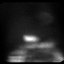
[frame 16/64]
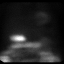
[frame 27/64]
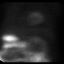
[frame 38/64]
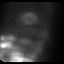
[frame 48/64]
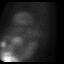
[frame 59/64]
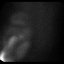

[Series 1: wbr_s-proj_st stress · 6.51mm/px · 6 of 512 frames shown (2 of 2)]
[frame 43/512]
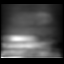
[frame 128/512]
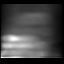
[frame 214/512]
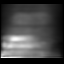
[frame 299/512]
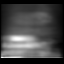
[frame 384/512]
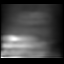
[frame 470/512]
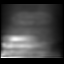

[Series 1: stress · 6.51mm/px · 6 of 512 frames shown (2 of 2)]
[frame 43/512]
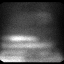
[frame 128/512]
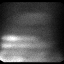
[frame 214/512]
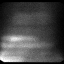
[frame 299/512]
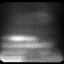
[frame 384/512]
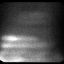
[frame 470/512]
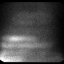

[Series 2: wbr_r-proj_st rest · 6.51mm/px · 6 of 64 frames shown]
[frame 6/64]
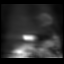
[frame 16/64]
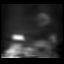
[frame 27/64]
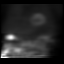
[frame 38/64]
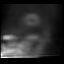
[frame 48/64]
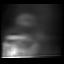
[frame 59/64]
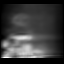

[Series 2: rest · 6.51mm/px · 6 of 64 frames shown]
[frame 6/64]
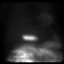
[frame 16/64]
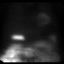
[frame 27/64]
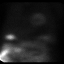
[frame 38/64]
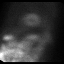
[frame 48/64]
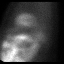
[frame 59/64]
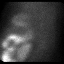

[36 of 36 positions shown; findings below may reference images not displayed]

Canned report from images found in remote index.

Refer to host system for actual result text.

## 2017-10-08 ENCOUNTER — Encounter: Payer: Self-pay | Admitting: Neurology

## 2017-10-10 ENCOUNTER — Encounter: Payer: Self-pay | Admitting: Neurology

## 2017-10-10 ENCOUNTER — Ambulatory Visit: Payer: Medicare Other | Admitting: Neurology

## 2017-10-10 VITALS — BP 159/89 | HR 49 | Ht 73.0 in | Wt 334.0 lb

## 2017-10-10 DIAGNOSIS — Z9989 Dependence on other enabling machines and devices: Secondary | ICD-10-CM

## 2017-10-10 DIAGNOSIS — I4510 Unspecified right bundle-branch block: Secondary | ICD-10-CM | POA: Diagnosis not present

## 2017-10-10 DIAGNOSIS — G4733 Obstructive sleep apnea (adult) (pediatric): Secondary | ICD-10-CM | POA: Diagnosis not present

## 2017-10-10 DIAGNOSIS — I10 Essential (primary) hypertension: Secondary | ICD-10-CM | POA: Diagnosis not present

## 2017-10-10 NOTE — Patient Instructions (Signed)

## 2017-10-10 NOTE — Progress Notes (Signed)
[facility]  PATIENT: Tommy [name] DOB: 10/08/1945   REASON FOR VISIT: Follow-up for obstructive sleep apnea with CPAP, morbid obesity HISTORY FROM: Patient alone.    Interval history from 10 Oct 2017, I have the pleasure of meeting with [name]. [name] today who underwent a replacement surgery under the guidance of Dr. Posey Pronto.  His right hip is now healed recovered and he is fairly mobile.  He reports that in the last 6 weeks he has done more project in the whole year before.  In order to get ready for surgery and limit his surgical risk he was able to lose weight but he has already gained back 20 pounds.  But it was previously hip pain that interrupted and fragmented his sleep it is now nocturia.  He states that he has no longer blocks of 4 interrupted interrupted hours of sleep but only about 2.  He has been as in the years before 100% compliant with CPAP he is using an AutoSet between 10 and 16 cmH2O with 3 cm EPR and this window covers his current pressure needs.  His pressure at the 95th percentile is 11.9 cmH2O so well was in the window.  Residual apnea and hypercapnia index AHI was 1.5/h and they were 3 minutes or 1% of the night of Cheyne-Stokes respirations. He takes a muscle relaxant at night.  DME is AHC-mask type nasal pillow, air fit P 10  Epworth 5 points, not bad. FSS at 35, geriatric depression score is 2/15. He sleeps so much better with CPAP, noted the difference when his home lost power.   Fam history: Brother has now OSA with CPAP.  Patient reports vivid dreams in AM - not enacting them.      Interval history from 10/10/2016 area at the pleasure of seeing Tommy [name] today, who is diabetic, hypertensive and was diagnosed with obstructive sleep apnea in 2015. His diabetes is currently treated with invokana- this causes some nocturia. He is compliant with his CPAP use, uses an AutoSet between 10 and 16 cm water with 3 cm EPR  achieving a residual AHI of 1.2 apneas per hour. His 100% compliant by days and hours of use was an average user time of 6 hours and 50 minutes. He is using an air sense machine by [employer] and nasal pillows. No adjustments have to be made on this side. He still uses CPAP without ramp function and with a high starting pressure. I will repeat an ONO to see if he still needs oxygen. He likes to travel and oxygen need complicates things. He has hip pain, awaiting a consultation with Dr. Maureen Ralphs.   His wife is on CPAP, too.    HISTORY OF PRESENT ILLNESS: Tommy [name] is a 72 y.o.caucasian , right handed male , who is seen here as a referral from Dr. Virgina Jock for sleep evaluation.  The patient Is a retired Designer, jewellery, Music therapist. He has sleep problems of nocturia, snoring and GERD in sleep, EDS . He also has a history of osteoarthritis allergic rhinitis, GERD, hypertension, fatty liver disease. He has a family history of heart disease or hypertension. Tommy [name] reports that she had back surgery ( laminectomy in level L4-L5 ) and had reported to his orthopedists that he was excessively daytime sleepy and had trouble staying awake when driving longer distances. Dr. Rolena Infante asked him to make his primary care physician aware of this problem.  Prior to his back surgery in 2014 the  patient was used to sleep in a recliner mainly because he could not find a comfortable position in bed. Since his back surgery this has improved tremendously and he is now sleeping in a regular bed. He has aches and pain when exercising, gained weight even after back surgery, he gained 60 pounds before surgery in only 3 years. He is frequently short of breath when just speaking, not exercising.He has a sister with heart disease, a brother with obstructive sleep apnea , mother but was diagnosed as obstructive sleep apnea and his father is deceased with congestive heart failure.   Interval history 04-07-14 CD Tommy [name]  underwent a split night polysomnography on 02-01-14 after endorsing the Epworth Sleepiness Scale at 16 points and presenting with a neck circumference of 20 inches and a BMI of 48.5. The patient's AHI was 43.1 he did not have any REM sleep, he did not sleep supine for these 2 hours of diagnostic study - his CO2 increased to 54.44. His oxygen nadir was 84% for 43.1 minutes the patient was titrated and interestingly his oxygen saturations actually decreased further 164 minutes of desaturations were found during the CPAP titration with a nadir at 71% [name]. And he had nocturia several times at night. He was fitted with an O2 pressure window between 10 and 16 cm water. We also recommended possible oxygen therapy should his hypoxemia continue on CPAP. Today is the patient's first visit after sleep study and he has noted some remarkable differences for example his Epworth Sleepiness Scale is now 10 and his fatigue severity score 41 points. The first 30 days of use he struggled with his CPAP and mostly slept in a recliner. He now has adjusted quite well at 30 day download dated 04-05-16 chills 5 hours and 33 minutes of daily use 100% compliance of full-time EPR level of 3 cm water and a pressure of 12.9 cm water at the 91st percentile. His residual AHI is 1.3. There were no Cheyne-Stokes respirations noted. An adjustment of settings is not necessary but the patient wishes to not have a ramp function that. He has likely to use some additional oxygen. ONO showed on CPAP low oxygenation. He has taken off the RAMP function and reduced the humidity.  He has not fallen asleep in church since being on CPAP.    REVIEW OF SYSTEMS: Full 14 system review of systems performed and notable only for those listed, all others are neg:   No longer falling asleep in church.  No longer naps/ sleeps in a recliner since on CPAP. Hip pain much improved . DM with nocturia ( every 2 -3 hours)   and polyuria on INVOKANA.  No snoring on CPAP. Reports 6- 7 hours of sleep nightly, Reports improvement of  hip and lower back pain.     HOME MEDICATIONS: Outpatient Medications Prior to Visit  Medication Sig Dispense Refill  . amLODipine (NORVASC) 10 MG tablet Take 10 mg by mouth every morning.     Marland Kitchen BYSTOLIC 20 MG TABS TAKE 1 TABLET BY MOUTH DAILY. (Patient taking differently: TAKE 20 MG BY MOUTH DAILY.) 90 tablet 3  . FIBER PO Take 2 tablets by mouth every morning.    Marland Kitchen ipratropium (ATROVENT) 0.03 % nasal spray Place 1 spray into both nostrils at bedtime as needed for rhinitis.     . metFORMIN (GLUCOPHAGE) 500 MG tablet Take 500 mg by mouth daily with breakfast.     . methocarbamol (  ROBAXIN) 500 MG tablet Take 1 tablet (500 mg total) by mouth every 6 (six) hours as needed for muscle spasms. 60 tablet 0  . mometasone (NASONEX) 50 MCG/ACT nasal spray Place 2 sprays into the nose daily.    . montelukast (SINGULAIR) 10 MG tablet Take 10 mg by mouth every morning.    . valsartan-hydrochlorothiazide (DIOVAN-HCT) 320-25 MG per tablet Take 1 tablet by mouth every morning.    . INVOKANA 100 MG TABS tablet Take 50 mg by mouth daily.   3  . oxyCODONE (OXY IR/ROXICODONE) 5 MG immediate release tablet Take 1-2 tablets (5-10 mg total) by mouth every 4 (four) hours as needed for moderate pain or severe pain. 60 tablet 0  . rivaroxaban (XARELTO) 10 MG TABS tablet Take 1 tablet (10 mg total) by mouth daily with breakfast. Take Xarelto for two and a half more weeks following discharge from the hospital, then discontinue Xarelto. Once the patient has completed the Xarelto, they may resume the 81 mg Aspirin. 20 tablet 0   No facility-administered medications prior to visit.     PAST MEDICAL HISTORY: Past Medical History:  Diagnosis Date  . Allergy   . Anemia    "as teenager"  . Arthritis    hands, hip  . Back pain   . BPPV (benign paroxysmal positional vertigo)    History of  . Cancer ([place])     "melanoma removed from leg years ago", malignant mole  . Chronic back pain   . Complication of anesthesia    woke up and was confused and combative after back surgery  . Constipation   . DDD (degenerative disc disease), lumbar   . Diabetes mellitus without complication ([place])   . Diabetic neuropathy ([place])   . ED (erectile dysfunction)   . Essential hypertension 10/16/2015  . Fatty liver   . First degree AV block    EKG 12/03/16  . GERD (gastroesophageal reflux disease)    diet controlled, no meds currently, history of  . H/O hiatal hernia   . History of elevated PSA   . Hypertension    sees Dr. Virgina Jock  . Nocturia more than twice per night 12/21/2013  . OA (osteoarthritis)   . Obesity hypoventilation syndrome ([place]) 04/07/2014  . Obesity, morbid ([place]) 12/21/2013  . OSA on CPAP 04/07/2014  . RBBB    previous EKG  . Sleep apnea    uses CPAP nightly  . Snoring 12/21/2013  . Spondylosis 2014   lumbar back, noted on MR   . Ulcer    stomach ulcer "as teenager"    PAST SURGICAL HISTORY: Past Surgical History:  Procedure Laterality Date  . BACK SURGERY     "done approx. 24 years ago"  . CARDIOVASCULAR STRESS TEST     "done approx 10 years ago, Dr. Virgina Jock"  . COLONOSCOPY  2006   hx of "with removal of polyps"/[facility]  . COLONOSCOPY WITH PROPOFOL N/A 09/06/2015   Procedure: COLONOSCOPY WITH PROPOFOL;  Surgeon: Ladene Artist, MD;  Location: [facility];  Service: Endoscopy;  Laterality: N/A;  . lipoma removed     from leg  . LUMBAR LAMINECTOMY/DECOMPRESSION MICRODISCECTOMY  06/19/2012   Procedure: LUMBAR LAMINECTOMY/DECOMPRESSION MICRODISCECTOMY 1 LEVEL;  Surgeon: Melina Schools, MD;  Location: [facility];  Service: Orthopedics;  Laterality: Left;  L2-3 LEFT MICRODISCECTOMY  . thigh surgery     fatty deposit  . TONSILLECTOMY    . TOTAL HIP ARTHROPLASTY Right 06/05/2017   Procedure: RIGHT TOTAL HIP ARTHROPLASTY ANTERIOR  APPROACH;  Surgeon: Gaynelle Arabian, MD;  Location: [facility];  Service:  Orthopedics;  Laterality: Right;  Dr. requested 120 minutes  . WISDOM TOOTH EXTRACTION      FAMILY HISTORY: Family History  Problem Relation Age of Onset  . Dementia Mother   . Colon polyps Mother   . Heart failure Father   . Breast cancer Sister   . Heart attack Sister   . Thyroid disease Sister   . Colon cancer Neg Hx   . Esophageal cancer Neg Hx   . Rectal cancer Neg Hx   . Stomach cancer Neg Hx     SOCIAL HISTORY: Social History   Socioeconomic History  . Marital status: Married    Spouse name: [name]  . Number of children: 2  . Years of education: Masters  . Highest education level: Not on file  Occupational History    Employer: RETIRED  Social Needs  . Financial resource strain: Not on file  . Food insecurity:    Worry: Not on file    Inability: Not on file  . Transportation needs:    Medical: Not on file    Non-medical: Not on file  Tobacco Use  . Smoking status: Never Smoker  . Smokeless tobacco: Never Used  Substance and Sexual Activity  . Alcohol use: No    Alcohol/week: 0.0 oz  . Drug use: No  . Sexual activity: Not on file  Lifestyle  . Physical activity:    Days per week: Not on file    Minutes per session: Not on file  . Stress: Not on file  Relationships  . Social connections:    Talks on phone: Not on file    Gets together: Not on file    Attends religious service: Not on file    Active member of club or organization: Not on file    Attends meetings of clubs or organizations: Not on file    Relationship status: Not on file  . Intimate partner violence:    Fear of current or ex partner: Not on file    Emotionally abused: Not on file    Physically abused: Not on file    Forced sexual activity: Not on file  Other Topics Concern  . Not on file  Social History Narrative   Patient is married [name]) and lives at home with his wife.   Patient has two adult children.   Patient is retired.   Patient has a Master's degree.   Patient  is right-handed   Patient does not drink any caffeine.     PHYSICAL EXAM  Vitals:   10/10/17 0813  BP: (!) 159/89  Pulse: (!) 49  Weight: (!) 334 lb (151.5 kg)  Height: 6\' 1"  (1.854 m)   Body mass index is 44.07 kg/m. patient gained back over 20 pounds in 62month. General: The patient is awake, alert and appears not in acute distress. The patient is well groomed. Head: Normocephalic, atraumatic.  Neck is supple. Mallampati 3-4 , neck circumference: 19 . 5,  Natural teeth-  Cardiovascular: Regular rate and rhythm , without murmurs  Respiratory: Lungs are clear to auscultation.  Skin: Without evidence of edema, or rash, well healed right hip scar.    Neurologic exam : No changes in taste and smell- The patient is awake and alert, oriented to place and time. Memory subjective described as intact. There is a normal attention span & concentration ability. Speech is fluent without dysarthria, dysphonia or aphasia. Mood and  affect are appropriate. Cranial nerves:Pupils are equal and briskly reactive to light. Visual fields by finger perimetry are intact.Hearing to finger rub intact. Facial sensation intact to fine touch. Facial motor strength is symmetric and tongue and uvula move midline.Motor full strength in the upper and lower extremities- no tremor, no dysmetria.  Deep tendon reflexes: in the upper and lower extremities are symmetric and intact.    DIAGNOSTIC DATA (LABS, IMAGING, TESTING) -  ASSESSMENT AND PLAN 72 y.o. year old male has a past medical history of Hypertension; ; Anemia; Ulcer; Chronic back pain; Obesity, morbid (12/21/2013); Snoring (12/21/2013); Nocturia more than twice per night (12/21/2013); Back pain; Obesity hypoventilation syndrome (04/07/2014); and OSA on CPAP (04/07/2014). here to follow-up.  25 minute visit.    He is 100% compliant with his download 6 hours and 22 minutes. AHI 1.5   Excellent compliance with CPAP continue same settings  Follow-up  yearly . Stay well hydrated by drinking water 8 to 10  glasses a day. He will follow yearly. Address HTN with Dr Virgina Jock and Dr. Oval Linsey.   Larey Seat, MD   [facility] [address] [place], [place] [address] [phone]

## 2017-11-25 ENCOUNTER — Other Ambulatory Visit: Payer: Self-pay | Admitting: Cardiovascular Disease

## 2017-11-25 NOTE — Telephone Encounter (Signed)
Rx sent to pharmacy   

## 2017-12-23 ENCOUNTER — Encounter: Payer: Self-pay | Admitting: Cardiovascular Disease

## 2017-12-23 ENCOUNTER — Ambulatory Visit: Payer: Medicare Other | Admitting: Cardiovascular Disease

## 2017-12-23 VITALS — BP 162/84 | HR 61 | Ht 73.0 in | Wt 348.6 lb

## 2017-12-23 DIAGNOSIS — R0602 Shortness of breath: Secondary | ICD-10-CM | POA: Diagnosis not present

## 2017-12-23 DIAGNOSIS — E7800 Pure hypercholesterolemia, unspecified: Secondary | ICD-10-CM | POA: Diagnosis not present

## 2017-12-23 DIAGNOSIS — I10 Essential (primary) hypertension: Secondary | ICD-10-CM | POA: Diagnosis not present

## 2017-12-23 MED ORDER — DOXAZOSIN MESYLATE 4 MG PO TABS: 4.0000 mg | ORAL_TABLET | Freq: Every day | ORAL | 1 refills | Status: DC

## 2017-12-23 NOTE — Patient Instructions (Signed)
Medication Instructions:  START DOXAZOSIN 4 MG DAILY   Labwork: NONE  Testing/Procedures: NONE  Follow-Up: Your physician recommends that you schedule a follow-up appointment in: [place] physician wants you to follow-up in: [address] [facility]  You will receive a reminder letter in the mail two months in advance. If you don't receive a letter, please call our office to schedule the follow-up appointment.   MONITOR YOUR BLOOD PRESSURE AT HOME AND BRING READINGS TO YOU FOLLOW UP   If you need a refill on your cardiac medications before your next appointment, please call your pharmacy.

## 2017-12-23 NOTE — Progress Notes (Signed)
Cardiology Office Note   Date:  12/23/2017   ID:  Tommy [name], DOB 1945/06/03, MRN [id number]  PCP:  Shon Baton, MD  Cardiologist:   Skeet Latch, MD   Chief Complaint  Patient presents with  . Shortness of Breath      History of Present Illness: Tommy [name] is a 72 y.o. male with asthma, hypertension, GERD, fatty liver disease, OSA on CPAP and morbid obesity who presents for follow up on shortness of breath. Tommy [name] saw his PCP, Dr. Shon Baton, on 09/20/15. At that appointment it was noted that a chest x-ray in January 2014 showed a tortuous aorta and calcifications of the thoracic aorta. He also reported dyspnea on exertion. He was referred to cardiology for further evaluation.  He was referred for a Lexiscan Myoview 10/2015 that was negative for ischemia but the ejection fraction was reported as 30-44%. He subsequently had an echocardiogram that revealed normal systolic function and mild LVH.  Diastolic function was abnormal but the degree of dysfunction was not commented upon and the echo is not currently available for review.  Since his last appointment Tommy [name] had his hip replaced.  Since that time he has been much more mobile.  However he has numbness and pain that radiates down into his legs.  This limits his ability to exercise.  He does like to walk in the pool but his legs tighten up even with doing this.  He has no chest pain or shortness of breath with this activity.  However he notes that if he is working in his yard and digging he gets short of breath after about 10 minutes.  There is no associated chest pain.  He has chronic lower extremity edema that is unchanged from baseline.  He denies orthopnea or PND.  He does report nocturia.  When he gets up at night it makes it difficult for him to fall back asleep and he sometimes struggles to put his CPAP back on at that time.  He checks his blood pressure at home and reports that is been mostly in the  130s over 80s.  He did take his medication already today.  He has a history of taking naproxen regularly and is trying to cut back.  Most recently has been taking it only once per day and in the last week he has not taken it at all.  However he has noted increase in his pain.  He does not add any salt to his food he cooks at home but he does eat out frequently.   Past Medical History:  Diagnosis Date  . Allergy   . Anemia    "as teenager"  . Arthritis    hands, hip  . Back pain   . BPPV (benign paroxysmal positional vertigo)    History of  . Cancer ([place])    "melanoma removed from leg years ago", malignant mole  . Chronic back pain   . Complication of anesthesia    woke up and was confused and combative after back surgery  . Constipation   . DDD (degenerative disc disease), lumbar   . Diabetes mellitus without complication ([place])   . Diabetic neuropathy ([place])   . ED (erectile dysfunction)   . Essential hypertension 10/16/2015  . Fatty liver   . First degree AV block    EKG 12/03/16  . GERD (gastroesophageal reflux disease)    diet controlled, no meds currently, history of  . H/O hiatal hernia   .  History of elevated PSA   . Hypertension    sees Dr. Virgina Jock  . Nocturia more than twice per night 12/21/2013  . OA (osteoarthritis)   . Obesity hypoventilation syndrome ([place]) 04/07/2014  . Obesity, morbid ([place]) 12/21/2013  . OSA on CPAP 04/07/2014  . RBBB    previous EKG  . Sleep apnea    uses CPAP nightly  . Snoring 12/21/2013  . Spondylosis 2014   lumbar back, noted on MR   . Ulcer    stomach ulcer "as teenager"    Past Surgical History:  Procedure Laterality Date  . BACK SURGERY     "done approx. 24 years ago"  . CARDIOVASCULAR STRESS TEST     "done approx 10 years ago, Dr. Virgina Jock"  . COLONOSCOPY  2006   hx of "with removal of polyps"/[facility]  . COLONOSCOPY WITH PROPOFOL N/A 09/06/2015   Procedure: COLONOSCOPY WITH PROPOFOL;  Surgeon: Ladene Artist, MD;  Location: [facility]  ENDOSCOPY;  Service: Endoscopy;  Laterality: N/A;  . lipoma removed     from leg  . LUMBAR LAMINECTOMY/DECOMPRESSION MICRODISCECTOMY  06/19/2012   Procedure: LUMBAR LAMINECTOMY/DECOMPRESSION MICRODISCECTOMY 1 LEVEL;  Surgeon: Melina Schools, MD;  Location: [facility];  Service: Orthopedics;  Laterality: Left;  L2-3 LEFT MICRODISCECTOMY  . thigh surgery     fatty deposit  . TONSILLECTOMY    . TOTAL HIP ARTHROPLASTY Right 06/05/2017   Procedure: RIGHT TOTAL HIP ARTHROPLASTY ANTERIOR APPROACH;  Surgeon: Gaynelle Arabian, MD;  Location: [facility];  Service: Orthopedics;  Laterality: Right;  Dr. requested 120 minutes  . WISDOM TOOTH EXTRACTION       Current Outpatient Medications  Medication Sig Dispense Refill  . amLODipine (NORVASC) 10 MG tablet Take 10 mg by mouth every morning.     Marland Kitchen FIBER PO Take 2 tablets by mouth every morning.    Marland Kitchen ipratropium (ATROVENT) 0.03 % nasal spray Place 1 spray into both nostrils at bedtime as needed for rhinitis.     . metFORMIN (GLUCOPHAGE) 500 MG tablet Take 500 mg by mouth daily with breakfast.     . methocarbamol (ROBAXIN) 500 MG tablet Take 1 tablet (500 mg total) by mouth every 6 (six) hours as needed for muscle spasms. 60 tablet 0  . mometasone (NASONEX) 50 MCG/ACT nasal spray Place 2 sprays into the nose daily.    . montelukast (SINGULAIR) 10 MG tablet Take 10 mg by mouth every morning.    . Nebivolol HCl (BYSTOLIC) 20 MG TABS TAKE 20 MG BY MOUTH DAILY. Keep OV 90 tablet 0  . valsartan-hydrochlorothiazide (DIOVAN-HCT) 320-25 MG per tablet Take 1 tablet by mouth every morning.    Marland Kitchen doxazosin (CARDURA) 4 MG tablet Take 1 tablet (4 mg total) by mouth daily. 90 tablet 1   No current facility-administered medications for this visit.     Allergies:   Micardis [telmisartan]    Social History:  The patient  reports that he has never smoked. He has never used smokeless tobacco. He reports that he does not drink alcohol or use drugs.   Family History:  The patient's  family history includes Breast cancer in his sister; Colon polyps in his mother; Dementia in his mother; Heart attack in his sister; Heart failure in his father; Thyroid disease in his sister.    ROS:  Please see the history of present illness.   Otherwise, review of systems are positive for depression.   All other systems are reviewed and negative.    PHYSICAL EXAM: VS:  BP (!) 162/84   Pulse 61   Ht 6\' 1"  (1.854 m)   Wt (!) 348 lb 9.6 oz (158.1 kg)   BMI 45.99 kg/m  , BMI Body mass index is 45.99 kg/m. GENERAL:  Well appearing HEENT: Pupils equal round and reactive, fundi not visualized, oral mucosa unremarkable NECK:  No jugular venous distention, waveform within normal limits, carotid upstroke brisk and symmetric, no bruits, no thyromegaly LYMPHATICS:  No cervical adenopathy LUNGS:  Clear to auscultation bilaterally HEART:  RRR.  PMI not displaced or sustained,S1 and S2 within normal limits, no S3, no S4, no clicks, no rubs, no murmurs ABD:  Flat, positive bowel sounds normal in frequency in pitch, no bruits, no rebound, no guarding, no midline pulsatile mass, no hepatomegaly, no splenomegaly EXT:  2 plus pulses throughout, trace edema, no cyanosis no clubbing SKIN:  No rashes no nodules NEURO:  Cranial nerves II through XII grossly intact, motor grossly intact throughout PSYCH:  Cognitively intact, oriented to person place and time   EKG:  EKG is ordered today. The ekg ordered 10/14/15 demonstrates sinus rhythm. Rate 86bpm. right bundle branch block. Left anterior fascicular block. 12/03/16: Sinus rhythm. Rate 69 bpm. First degree AV block.  Right bundle branch block. Left anterior fascicular block. 12/23/17: Sinus rhythm.  Rate 61 bpm.  First-degree AV block.  Right bundle branch block.  Left anterior fascicular block.  Echo 11/03/15: Study Conclusions  - Procedure narrative: Transthoracic echocardiography. Image  quality was suboptimal. The study was technically difficult.   Intravenous contrast (Definity) was administered. - Left ventricle: The cavity size was mildly dilated. Wall  thickness was increased in a pattern of mild LVH. Systolic  function was normal. The estimated ejection fraction was in the  range of 55% to 60%. Wall motion was normal; there were no  regional wall motion abnormalities. Acoustic contrast  opacification revealed no evidence ofthrombus. - Left atrium: The atrium was mildly dilated. - Right ventricle: The cavity size was mildly dilated.   Lexiscan Myoview 11/03/15:  Nuclear stress EF: 44%.  There was no ST segment deviation noted during stress.  No T wave inversion was noted during stress.  Defect 1: There is a small defect of moderate severity present in the apex location. Apical thinning vs prior infarct.  This is an intermediate risk study due to reduced LVEF.  The left ventricular ejection fraction is moderately decreased (30-44%).  No ischemia.  Recent Labs: 05/30/2017: ALT 17 06/06/2017: BUN 15; Creatinine, Ser 0.72; Hemoglobin 13.9; Platelets 248; Potassium 3.6; Sodium 139    Lipid Panel No results found for: CHOL, TRIG, HDL, CHOLHDL, VLDL, LDLCALC, LDLDIRECT   09/13/15: Sodium 142, potassium 4.1, BUN 14, creatinine 0.9 AST 28, PLT 48 WBC 8.2, hemoglobin 14, hematocrit 42.1, platelets 224 Total cholesterol 118, triglycerides 138, HDL 28, LDL 62 TSH 3.37 Hemoglobin A1c 7.6%  Wt Readings from Last 3 Encounters:  12/23/17 (!) 348 lb 9.6 oz (158.1 kg)  10/10/17 (!) 334 lb (151.5 kg)  06/05/17 (!) 302 lb (137 kg)      ASSESSMENT AND PLAN:  # Shortness of breath:  # Chronic diastolic heart failure: Stable.  We will work on BP control.  His echo showed normal systolic function. There was some degree of diastolic dysfunction but the degree could not be determined by his echo.  We discussed the importance of getting the weight off as he is gained 45 pounds in the last 6 months.  # Hypertension:  Blood  pressure is poorly-controlled  here and at home.  I suspect this is due to lack of exercise and falling back into all dietary patterns.  We will add doxazosin 4 mg daily which should also help with his nocturia.  # CV Disease Prevention:  ASCVD 10 year risk is 29%.  This is mostly attributable to his low HDL.  However, his LDL is 62. Therefore, we will not start a statin. He is due for a physical and labs with Dr. Virgina Jock soon.    # Morbid obesity: Discussed the importance of getting back to the dietary changes that worked well for him in the past.  Stop soda and watch salt.  Current medicines are reviewed at length with the patient today.  The patient does not have concerns regarding medicines.  The following changes have been made:  no change  Labs/ tests ordered today include:   Orders Placed This Encounter  Procedures  . EKG 12-Lead    Disposition:   FU with Julieana Eshleman C. Oval Linsey, MD, [facility] in 6 months.  Pharm.D. in 2 months.      Signed, Palmyra Rogacki C. Oval Linsey, MD, [facility]  12/23/2017 8:48 AM    [facility]

## 2018-02-25 ENCOUNTER — Ambulatory Visit: Payer: Medicare Other

## 2018-04-07 ENCOUNTER — Ambulatory Visit: Payer: Medicare Other | Admitting: Pharmacist

## 2018-04-07 VITALS — BP 132/76 | HR 61 | Resp 16 | Ht 74.0 in

## 2018-04-07 DIAGNOSIS — I10 Essential (primary) hypertension: Secondary | ICD-10-CM

## 2018-04-07 MED ORDER — DOXAZOSIN MESYLATE 4 MG PO TABS: 4.0000 mg | ORAL_TABLET | Freq: Every day | ORAL | 1 refills | Status: DC

## 2018-04-07 NOTE — Progress Notes (Signed)
Patient ID: Tommy [name]                 DOB: 1945/12/15                      MRN: [id number]     HPI: Tommy [name] is a 72 y.o. male referred by Dr. Oval Linsey  to HTN clinic.  PMH includes asthma uncontrolled hypertension, OSA on CPAP, obese with extremely variable weight, chronic back pain, DM, and GERD. Patient reports compliance with mediation but difficulty controlling diet and increasing physical activity. He lost > 60lbs in the past due prior to hip surgery but back eating every thing he likes and drinking soda/sweet tea with every meal. Not exercising much either.   Current HTN meds:  Amlodipine 10mg  daily  Doxazosin 4mg  daily Bystolic 20mg  daily Valsartan/HCT 320-25mg  daily  BP goal: 130/80  Family History: The patient's family history includes Breast cancer in his sister; Colon polyps in his mother; Dementia in his mother; Heart attack in his sister; Heart failure in his father; Thyroid disease in his sister.   Social History: The patient  reports that he has never smoked. He has never used smokeless tobacco. He reports that he does not drink alcohol or use drugs.   Diet: loves food and eats out frequently (~2 times per day most days), likes pepsi and sweet tea  Exercise: activities of daily living  Home BP readings:  20 readings; average 140/72 (higher readings from 9pm to 7am)  Wt Readings from Last 3 Encounters:  12/23/17 (!) 348 lb 9.6 oz (158.1 kg)  10/10/17 (!) 334 lb (151.5 kg)  06/05/17 (!) 302 lb (137 kg)   BP Readings from Last 3 Encounters:  04/07/18 132/76  12/23/17 (!) 162/84  10/10/17 (!) 159/89   Pulse Readings from Last 3 Encounters:  04/07/18 61  12/23/17 61  10/10/17 (!) 49     Past Medical History:  Diagnosis Date  . Allergy   . Anemia    "as teenager"  . Arthritis    hands, hip  . Back pain   . BPPV (benign paroxysmal positional vertigo)    History of  . Cancer ([place])    "melanoma removed from leg years ago", malignant  mole  . Chronic back pain   . Complication of anesthesia    woke up and was confused and combative after back surgery  . Constipation   . DDD (degenerative disc disease), lumbar   . Diabetes mellitus without complication ([place])   . Diabetic neuropathy ([place])   . ED (erectile dysfunction)   . Essential hypertension 10/16/2015  . Fatty liver   . First degree AV block    EKG 12/03/16  . GERD (gastroesophageal reflux disease)    diet controlled, no meds currently, history of  . H/O hiatal hernia   . History of elevated PSA   . Hypertension    sees Dr. Virgina Jock  . Nocturia more than twice per night 12/21/2013  . OA (osteoarthritis)   . Obesity hypoventilation syndrome ([place]) 04/07/2014  . Obesity, morbid ([place]) 12/21/2013  . OSA on CPAP 04/07/2014  . RBBB    previous EKG  . Sleep apnea    uses CPAP nightly  . Snoring 12/21/2013  . Spondylosis 2014   lumbar back, noted on MR   . Ulcer    stomach ulcer "as teenager"    Current Outpatient Medications on File Prior to Visit  Medication Sig Dispense Refill  .  amLODipine (NORVASC) 10 MG tablet Take 10 mg by mouth every morning.     Marland Kitchen FIBER PO Take 2 tablets by mouth every morning.    Marland Kitchen ipratropium (ATROVENT) 0.03 % nasal spray Place 1 spray into both nostrils at bedtime as needed for rhinitis.     . metFORMIN (GLUCOPHAGE) 500 MG tablet Take 500 mg by mouth daily with breakfast.     . methocarbamol (ROBAXIN) 500 MG tablet Take 1 tablet (500 mg total) by mouth every 6 (six) hours as needed for muscle spasms. 60 tablet 0  . mometasone (NASONEX) 50 MCG/ACT nasal spray Place 2 sprays into the nose daily.    . montelukast (SINGULAIR) 10 MG tablet Take 10 mg by mouth every morning.    . naproxen (NAPROSYN) 500 MG tablet Take 500 mg by mouth as needed.    . Nebivolol HCl (BYSTOLIC) 20 MG TABS TAKE 20 MG BY MOUTH DAILY. Keep OV 90 tablet 0  . valsartan-hydrochlorothiazide (DIOVAN-HCT) 320-25 MG per tablet Take 1 tablet by mouth every morning.     No  current facility-administered medications on file prior to visit.     Allergies  Allergen Reactions  . Micardis [Telmisartan] Hives and Rash    Blood pressure 132/76, pulse 61, resp. rate 16, height 6\' 2"  (1.88 m).  Essential hypertension Blood pressure is at goal during office visit but remains elevated at home with an average reading of 140/72. All BP readings above 175mmHg are between 9pm and 7am. BP drops 10-65mmHg after medication administration and sustains until evening hours. Patient has difficulties controlling his weight but refuses referral to care navigator or nutritionist. "No one understands how much he loves food" and how difficult is to keep dietary changes.   Patient will "work on" positive lifestyle modifications and goal of 10lbs weight lost by next OV with Dr Oval Linsey in 2 months. I changed his doxazosin dose to bedtime administration to control target early morning BP readings. Plan to increase dose to Doxazosin 4mg  BID during next office visit if patient able to tolerate.   Mirian Casco Rodriguez-Guzman PharmD, BCPS, [place] [address] 04/10/2018 2:28 PM

## 2018-04-07 NOTE — Patient Instructions (Addendum)
Return for a follow up appointment in 4 weeks  Check your blood pressure at home daily (if able) and keep record of the readings.  Take your BP meds as follows: *CHANGE doxazosin 4mg  to every evening* *CONTINUE all other medication as prescribed*  WORK on 20 pound weigh loss  Bring all of your meds, your BP cuff and your record of home blood pressures to your next appointment.  Exercise as you're able, try to walk approximately 30 minutes per day.  Keep salt intake to a minimum, especially watch canned and prepared boxed foods.  Eat more fresh fruits and vegetables and fewer canned items.  Avoid eating in fast food restaurants.    HOW TO TAKE YOUR BLOOD PRESSURE: . Rest 5 minutes before taking your blood pressure. .  Don't smoke or drink caffeinated beverages for at least 30 minutes before. . Take your blood pressure before (not after) you eat. . Sit comfortably with your back supported and both feet on the floor (don't cross your legs). . Elevate your arm to heart level on a table or a desk. . Use the proper sized cuff. It should fit smoothly and snugly around your bare upper arm. There should be enough room to slip a fingertip under the cuff. The bottom edge of the cuff should be 1 inch above the crease of the elbow. . Ideally, take 3 measurements at one sitting and record the average.

## 2018-04-10 ENCOUNTER — Encounter: Payer: Self-pay | Admitting: Pharmacist

## 2018-04-10 NOTE — Assessment & Plan Note (Signed)
Blood pressure is at goal during office visit but remains elevated at home with an average reading of 140/72. All BP readings above 118mmHg are between 9pm and 7am. BP drops 10-53mmHg after medication administration and sustains until evening hours. Patient has difficulties controlling his weight but refuses referral to care navigator or nutritionist. "No one understands how much he loves food" and how difficult is to keep dietary changes.   Patient will "work on" positive lifestyle modifications and goal of 10lbs weight lost by next OV with Dr Oval Linsey in 2 months. I changed his doxazosin dose to bedtime administration to control target early morning BP readings. Plan to increase dose to Doxazosin 4mg  BID during next office visit if patient able to tolerate.

## 2018-05-06 ENCOUNTER — Ambulatory Visit: Payer: Medicare Other | Admitting: Pharmacist

## 2018-05-06 VITALS — BP 136/84 | HR 60 | Resp 16

## 2018-05-06 DIAGNOSIS — I10 Essential (primary) hypertension: Secondary | ICD-10-CM

## 2018-05-06 MED ORDER — DOXAZOSIN MESYLATE 4 MG PO TABS: 4.0000 mg | ORAL_TABLET | Freq: Two times a day (BID) | ORAL | 1 refills | Status: DC

## 2018-05-06 NOTE — Progress Notes (Signed)
Patient ID: JD MCCASTER                 DOB: 09-23-1945                      MRN: [id number]     HPI: Tommy [name] is a 72 y.o. male referred by Dr. Oval Linsey  to HTN clinic.  PMH includes asthma uncontrolled hypertension, OSA on CPAP, obese with extremely variable weight, chronic back pain, DM, and GERD. At his last visit patient reported dietary indiscretions contributing to high blood pressures. His doxazosin was change to evening administration at last visit with plans to increase dose to BID today if pressures not controlled.  He presents today for follow up with detailed log. He states that his pressure have been doing well since changing doxazosin in the evening. He states his pressure have improved, but still running higher than would like. He denies any side effects of his current regimen and orthostatic dizziness.   Current HTN meds:  Amlodipine 10mg  daily in the am Doxazosin 4mg  daily in the pm Bystolic 20mg  daily in the am Valsartan/HCT 320-25mg  daily in the AM  BP goal: <130/80  Family History: The patient's family history includes Breast cancer in his sister; Colon polyps in his mother; Dementia in his mother; Heart attack in his sister; Heart failure in his father; Thyroid disease in his sister.   Social History: The patient  reports that he has never smoked. He has never used smokeless tobacco. He reports that he does not drink alcohol or use drugs.   Diet: loves food and eats out frequently (~2 times per day most days), likes pepsi and sweet tea  Exercise: activities of daily living  Home BP readings: range 109-150/59-82 - mostly 130s/60s   Wt Readings from Last 3 Encounters:  12/23/17 (!) 348 lb 9.6 oz (158.1 kg)  10/10/17 (!) 334 lb (151.5 kg)  06/05/17 (!) 302 lb (137 kg)   BP Readings from Last 3 Encounters:  05/06/18 136/84  04/07/18 132/76  12/23/17 (!) 162/84   Pulse Readings from Last 3 Encounters:  05/06/18 60  04/07/18 61  12/23/17 61       Past Medical History:  Diagnosis Date  . Allergy   . Anemia    "as teenager"  . Arthritis    hands, hip  . Back pain   . BPPV (benign paroxysmal positional vertigo)    History of  . Cancer ([place])    "melanoma removed from leg years ago", malignant mole  . Chronic back pain   . Complication of anesthesia    woke up and was confused and combative after back surgery  . Constipation   . DDD (degenerative disc disease), lumbar   . Diabetes mellitus without complication ([place])   . Diabetic neuropathy ([place])   . ED (erectile dysfunction)   . Essential hypertension 10/16/2015  . Fatty liver   . First degree AV block    EKG 12/03/16  . GERD (gastroesophageal reflux disease)    diet controlled, no meds currently, history of  . H/O hiatal hernia   . History of elevated PSA   . Hypertension    sees Dr. Virgina Jock  . Nocturia more than twice per night 12/21/2013  . OA (osteoarthritis)   . Obesity hypoventilation syndrome ([place]) 04/07/2014  . Obesity, morbid ([place]) 12/21/2013  . OSA on CPAP 04/07/2014  . RBBB    previous EKG  . Sleep apnea  uses CPAP nightly  . Snoring 12/21/2013  . Spondylosis 2014   lumbar back, noted on MR   . Ulcer    stomach ulcer "as teenager"    Current Outpatient Medications on File Prior to Visit  Medication Sig Dispense Refill  . amLODipine (NORVASC) 10 MG tablet Take 10 mg by mouth every morning.     Marland Kitchen FIBER PO Take 2 tablets by mouth every morning.    Marland Kitchen ipratropium (ATROVENT) 0.03 % nasal spray Place 1 spray into both nostrils at bedtime as needed for rhinitis.     . metFORMIN (GLUCOPHAGE) 500 MG tablet Take 500 mg by mouth daily with breakfast.     . methocarbamol (ROBAXIN) 500 MG tablet Take 1 tablet (500 mg total) by mouth every 6 (six) hours as needed for muscle spasms. 60 tablet 0  . mometasone (NASONEX) 50 MCG/ACT nasal spray Place 2 sprays into the nose daily.    . montelukast (SINGULAIR) 10 MG tablet Take 10 mg by mouth every morning.    .  naproxen (NAPROSYN) 500 MG tablet Take 500 mg by mouth as needed.    . Nebivolol HCl (BYSTOLIC) 20 MG TABS TAKE 20 MG BY MOUTH DAILY. Keep OV 90 tablet 0  . valsartan-hydrochlorothiazide (DIOVAN-HCT) 320-25 MG per tablet Take 1 tablet by mouth every morning.     No current facility-administered medications on file prior to visit.     Allergies  Allergen Reactions  . Micardis [Telmisartan] Hives and Rash    Blood pressure 136/84, pulse 60, resp. rate 16.   His cuff: 139/84  ASSESSMENT and PLAN:  Hypertension: BP today is slightly above range. Pt would like to proceed with plan as discussed with Raquel at previous visit and take doxazosin 4mg  BID. Advised to continue to monitor pressures and call the clinic with any orthostatic symptoms. He will follow up with Dr. Oval Linsey as scheduled and HTN clinic if needed for additional management.   Thank you, Lelan Pons. Patterson Hammersmith, [place]  [address], [place], [place] [address]  Phone: [phone]; Fax: [phone] 05/06/2018 11:31 AM

## 2018-05-06 NOTE — Patient Instructions (Signed)
Return for a follow up appointment as scheduled with Dr. Oval Linsey  Check your blood pressure at home daily (if able) and keep record of the readings.  Take your BP meds as follows: CONTINUE: Amlodipine 10mg  daily  Bystolic 20mg  daily Valsartan/HCT 320-25mg  daily  INCREASE:  Doxazosin to 4mg  TWICE daily   Bring all of your meds, your BP cuff and your record of home blood pressures to your next appointment.  Exercise as you're able, try to walk approximately 30 minutes per day.  Keep salt intake to a minimum, especially watch canned and prepared boxed foods.  Eat more fresh fruits and vegetables and fewer canned items.  Avoid eating in fast food restaurants.    HOW TO TAKE YOUR BLOOD PRESSURE: . Rest 5 minutes before taking your blood pressure. .  Don't smoke or drink caffeinated beverages for at least 30 minutes before. . Take your blood pressure before (not after) you eat. . Sit comfortably with your back supported and both feet on the floor (don't cross your legs). . Elevate your arm to heart level on a table or a desk. . Use the proper sized cuff. It should fit smoothly and snugly around your bare upper arm. There should be enough room to slip a fingertip under the cuff. The bottom edge of the cuff should be 1 inch above the crease of the elbow. . Ideally, take 3 measurements at one sitting and record the average.

## 2018-06-01 ENCOUNTER — Other Ambulatory Visit: Payer: Self-pay | Admitting: Cardiovascular Disease

## 2018-06-23 ENCOUNTER — Ambulatory Visit: Payer: Medicare Other | Admitting: Cardiovascular Disease

## 2018-06-23 ENCOUNTER — Encounter: Payer: Self-pay | Admitting: Cardiovascular Disease

## 2018-06-23 VITALS — BP 150/74 | HR 57 | Ht 73.0 in | Wt 368.8 lb

## 2018-06-23 DIAGNOSIS — R0602 Shortness of breath: Secondary | ICD-10-CM

## 2018-06-23 DIAGNOSIS — I10 Essential (primary) hypertension: Secondary | ICD-10-CM

## 2018-06-23 DIAGNOSIS — E7800 Pure hypercholesterolemia, unspecified: Secondary | ICD-10-CM | POA: Diagnosis not present

## 2018-06-23 MED ORDER — DOXAZOSIN MESYLATE 2 MG PO TABS: 2.0000 mg | ORAL_TABLET | Freq: Two times a day (BID) | ORAL | 1 refills | Status: DC

## 2018-06-23 NOTE — Progress Notes (Signed)
Cardiology Office Note   Date:  06/23/2018   ID:  Tommy [name], DOB Aug 07, 1945, MRN [id number]  PCP:  Shon Baton, MD  Cardiologist:   Skeet Latch, MD   No chief complaint on file.    History of Present Illness: Tommy [name] is a 73 y.o. male with asthma, hypertension, GERD, fatty liver disease, OSA on CPAP and morbid obesity who presents for follow up on shortness of breath. Tommy [name] saw his PCP, Dr. Shon Baton, on 09/20/15. At that appointment it was noted that a chest x-ray in January 2014 showed a tortuous aorta and calcifications of the thoracic aorta. He also reported dyspnea on exertion. He was referred to cardiology for further evaluation.  He was referred for a Lexiscan Myoview 10/2015 that was negative for ischemia but the ejection fraction was reported as 30-44%. He subsequently had an echocardiogram that revealed normal systolic function and mild LVH.  Diastolic function was abnormal but the degree of dysfunction was not commented upon and the echo is not currently available for review.  At his last appointment Tommy [name] blood pressure remained above goal.  He was restarted on doxazosin.  Since that time he has followed up with our pharmacist and the dose was increased to twice daily.  He brings a log of his blood pressure showing that they have been mostly in the 120s to 130s.  However he does have episodes where it is in the 140s to 150s.  This has improved since starting the second dose of doxazosin.  He notes that he has not been very physically active.  This is mostly because of pain from his back that radiates down into his leg.  His leg pain was better controlled after his hip was replaced.  However he subsequently had to have a back surgery where the nerve was moved and he is no longer as active.  He also has not been very conscientious about his diet.  Both he and his wife are hoping to work on diet, weight loss, and exercise.  He has no issues  with chest pain.  He does get short of breath with exertion.  He has no orthopnea or PND but has chronic lower extremity edema.  He is unable to wear compression socks.   Past Medical History:  Diagnosis Date  . Allergy   . Anemia    "as teenager"  . Arthritis    hands, hip  . Back pain   . BPPV (benign paroxysmal positional vertigo)    History of  . Cancer ([place])    "melanoma removed from leg years ago", malignant mole  . Chronic back pain   . Complication of anesthesia    woke up and was confused and combative after back surgery  . Constipation   . DDD (degenerative disc disease), lumbar   . Diabetes mellitus without complication ([place])   . Diabetic neuropathy ([place])   . ED (erectile dysfunction)   . Essential hypertension 10/16/2015  . Fatty liver   . First degree AV block    EKG 12/03/16  . GERD (gastroesophageal reflux disease)    diet controlled, no meds currently, history of  . H/O hiatal hernia   . History of elevated PSA   . Hypertension    sees Dr. Virgina [name]  . Nocturia more than twice per night 12/21/2013  . OA (osteoarthritis)   . Obesity hypoventilation syndrome ([place]) 04/07/2014  . Obesity, morbid ([place]) 12/21/2013  . OSA on CPAP 04/07/2014  .  RBBB    previous EKG  . Sleep apnea    uses CPAP nightly  . Snoring 12/21/2013  . Spondylosis 2014   lumbar back, noted on MR   . Ulcer    stomach ulcer "as teenager"    Past Surgical History:  Procedure Laterality Date  . BACK SURGERY     "done approx. 24 years ago"  . CARDIOVASCULAR STRESS TEST     "done approx 10 years ago, Dr. Virgina [name]"  . COLONOSCOPY  2006   hx of "with removal of polyps"/[facility]  . COLONOSCOPY WITH PROPOFOL N/A 09/06/2015   Procedure: COLONOSCOPY WITH PROPOFOL;  Surgeon: Ladene Artist, MD;  Location: [facility];  Service: Endoscopy;  Laterality: N/A;  . lipoma removed     from leg  . LUMBAR LAMINECTOMY/DECOMPRESSION MICRODISCECTOMY  06/19/2012   Procedure: LUMBAR LAMINECTOMY/DECOMPRESSION  MICRODISCECTOMY 1 LEVEL;  Surgeon: Melina Schools, MD;  Location: [place];  Service: Orthopedics;  Laterality: Left;  L2-3 LEFT MICRODISCECTOMY  . thigh surgery     fatty deposit  . TONSILLECTOMY    . TOTAL HIP ARTHROPLASTY Right 06/05/2017   Procedure: RIGHT TOTAL HIP ARTHROPLASTY ANTERIOR APPROACH;  Surgeon: Gaynelle Arabian, MD;  Location: [facility];  Service: Orthopedics;  Laterality: Right;  Dr. requested 120 minutes  . WISDOM TOOTH EXTRACTION       Current Outpatient Medications  Medication Sig Dispense Refill  . amLODipine (NORVASC) 10 MG tablet Take 10 mg by mouth every morning.     Marland Kitchen doxazosin (CARDURA) 4 MG tablet Take 1 tablet (4 mg total) by mouth 2 (two) times daily. 180 tablet 1  . FIBER PO Take 2 tablets by mouth every morning.    . INVOKANA 100 MG TABS tablet     . ipratropium (ATROVENT) 0.03 % nasal spray Place 1 spray into both nostrils at bedtime as needed for rhinitis.     . metFORMIN (GLUCOPHAGE) 500 MG tablet Take 500 mg by mouth daily with breakfast.     . methocarbamol (ROBAXIN) 500 MG tablet Take 1 tablet (500 mg total) by mouth every 6 (six) hours as needed for muscle spasms. 60 tablet 0  . mometasone (NASONEX) 50 MCG/ACT nasal spray Place 2 sprays into the nose daily.    . montelukast (SINGULAIR) 10 MG tablet Take 10 mg by mouth every morning.    . naproxen (NAPROSYN) 500 MG tablet Take 500 mg by mouth as needed.    . Nebivolol HCl (BYSTOLIC) 20 MG TABS TAKE 1 TABLET BY MOUTH ONCE DAILY 90 each 0  . valsartan-hydrochlorothiazide (DIOVAN-HCT) 320-25 MG per tablet Take 1 tablet by mouth every morning.    Marland Kitchen doxazosin (CARDURA) 2 MG tablet Take 1 tablet (2 mg total) by mouth 2 (two) times daily. TO BE TAKEN WITH THE 4 MG TABLETS 180 tablet 1   No current facility-administered medications for this visit.     Allergies:   Micardis [telmisartan]    Social History:  The patient  reports that he has never smoked. He has never used smokeless tobacco. He reports that he does  not drink alcohol or use drugs.   Family History:  The patient's family history includes Breast cancer in his sister; Colon polyps in his mother; Dementia in his mother; Heart attack in his sister; Heart failure in his father; Thyroid disease in his sister.    ROS:  Please see the history of present illness.   Otherwise, review of systems are positive for depression.   All other systems are  reviewed and negative.    PHYSICAL EXAM: VS:  BP (!) 150/74   Pulse (!) 57   Ht 6\' 1"  (1.854 m)   Wt (!) 368 lb 12.8 oz (167.3 kg)   BMI 48.66 kg/m  , BMI Body mass index is 48.66 kg/m. GENERAL:  Well appearing HEENT: Pupils equal round and reactive, fundi not visualized, oral mucosa unremarkable NECK:  No jugular venous distention, waveform within normal limits, carotid upstroke brisk and symmetric, no bruits, no thyromegaly LUNGS:  Clear to auscultation bilaterally HEART:  Distant heart sounds.  RRR.  PMI not displaced or sustained,S1 and S2 within normal limits, no S3, no S4, no clicks, no rubs, no murmurs ABD:  Flat, positive bowel sounds normal in frequency in pitch, no bruits, no rebound, no guarding, no midline pulsatile mass, no hepatomegaly, no splenomegaly EXT:  2 plus pulses throughout, 1+ LE edema, no cyanosis no clubbing SKIN:  No rashes no nodules NEURO:  Cranial nerves II through XII grossly intact, motor grossly intact throughout PSYCH:  Cognitively intact, oriented to person place and time  EKG:  EKG is ordered today. The ekg ordered 10/14/15 demonstrates sinus rhythm. Rate 86bpm. right bundle branch block. Left anterior fascicular block. 12/03/16: Sinus rhythm. Rate 69 bpm. First degree AV block.  Right bundle branch block. Left anterior fascicular block. 12/23/17: Sinus rhythm.  Rate 61 bpm.  First-degree AV block.  Right bundle branch block.  Left anterior fascicular block. 06/23/18: Sinus bradycardia.  Rate 57 bpm.  RBBB.  First degree AF block.  LAFB.  Echo 11/03/15: Study  Conclusions  - Procedure narrative: Transthoracic echocardiography. Image  quality was suboptimal. The study was technically difficult.  Intravenous contrast (Definity) was administered. - Left ventricle: The cavity size was mildly dilated. Wall  thickness was increased in a pattern of mild LVH. Systolic  function was normal. The estimated ejection fraction was in the  range of 55% to 60%. Wall motion was normal; there were no  regional wall motion abnormalities. Acoustic contrast  opacification revealed no evidence ofthrombus. - Left atrium: The atrium was mildly dilated. - Right ventricle: The cavity size was mildly dilated.   Lexiscan Myoview 11/03/15:  Nuclear stress EF: 44%.  There was no ST segment deviation noted during stress.  No T wave inversion was noted during stress.  Defect 1: There is a small defect of moderate severity present in the apex location. Apical thinning vs prior infarct.  This is an intermediate risk study due to reduced LVEF.  The left ventricular ejection fraction is moderately decreased (30-44%).  No ischemia.  Recent Labs: No results found for requested labs within last 8760 hours.    Lipid Panel No results found for: CHOL, TRIG, HDL, CHOLHDL, VLDL, LDLCALC, LDLDIRECT   09/13/15: Sodium 142, potassium 4.1, BUN 14, creatinine 0.9 AST 28, PLT 48 WBC 8.2, hemoglobin 14, hematocrit 42.1, platelets 224 Total cholesterol 118, triglycerides 138, HDL 28, LDL 62 TSH 3.37 Hemoglobin A1c 7.6%  Wt Readings from Last 3 Encounters:  06/23/18 (!) 368 lb 12.8 oz (167.3 kg)  12/23/17 (!) 348 lb 9.6 oz (158.1 kg)  10/10/17 (!) 334 lb (151.5 kg)      ASSESSMENT AND PLAN:  # Shortness of breath:  # Chronic diastolic heart failure: Normal systolic function on echo.  Likely diastolic heart failure.  His shortness of breath is mostly attributable to obesity and deconditioning.  Continue HCTZ and hypertension management.  # Hypertension:  Blood  pressure is better but not at goal.  Continue nebivolol, valsartan, HCTZ, and amlodipine.  Increase doxazosin to 6 mg twice daily.  He will continue to track his blood pressure at home.  # CV Disease Prevention:  ASCVD 10 year risk is 29%.  This is mostly attributable to his low HDL.  However, his LDL is 62. Therefore, we will not start a statin, especially given his fatty liver disease.   # Morbid obesity: Discussed the importance of getting back to the dietary changes that worked well for him in the past.  He is in the pre contemplative phase.  He previously was seen in weight loss clinic at [facility] and doesn't want to do that at [facility].  Current medicines are reviewed at length with the patient today.  The patient does not have concerns regarding medicines.  The following changes have been made:  no change  Labs/ tests ordered today include:   Orders Placed This Encounter  Procedures  . EKG 12-Lead    Disposition:   FU with Tabria Steines C. Oval Linsey, MD, [facility] in 3 months.  Pharm.D. in 1 months.      Signed, Devery Murgia C. Oval Linsey, MD, [facility]  06/23/2018 12:23 PM    [place]

## 2018-06-23 NOTE — Patient Instructions (Addendum)
Medication Instructions:  START DOXAZOSIN 6 MG TWICE A DAY   If you need a refill on your cardiac medications before your next appointment, please call your pharmacy.   Lab work: NONE   Testing/Procedures: NONE  Follow-Up: Your physician recommends that you schedule a follow-up appointment in: [place]  Your physician recommends that you schedule a follow-up appointment in: [place] [address] [facility]

## 2018-07-22 ENCOUNTER — Encounter: Payer: Self-pay | Admitting: Cardiology

## 2018-07-22 ENCOUNTER — Ambulatory Visit: Payer: Medicare Other | Admitting: Cardiology

## 2018-07-22 ENCOUNTER — Other Ambulatory Visit: Payer: Self-pay

## 2018-07-22 VITALS — BP 142/78 | HR 55 | Ht 73.0 in | Wt 367.0 lb

## 2018-07-22 DIAGNOSIS — I10 Essential (primary) hypertension: Secondary | ICD-10-CM

## 2018-07-22 DIAGNOSIS — G4733 Obstructive sleep apnea (adult) (pediatric): Secondary | ICD-10-CM | POA: Diagnosis not present

## 2018-07-22 DIAGNOSIS — E119 Type 2 diabetes mellitus without complications: Secondary | ICD-10-CM

## 2018-07-22 DIAGNOSIS — Z9989 Dependence on other enabling machines and devices: Secondary | ICD-10-CM

## 2018-07-22 NOTE — Assessment & Plan Note (Signed)
F/U B/Ps look good

## 2018-07-22 NOTE — Assessment & Plan Note (Signed)
BMI 48 

## 2018-07-22 NOTE — Progress Notes (Signed)
07/22/2018 Tommy [name]   June 07, 1945  [id number]  Primary Physician Shon Baton, MD Primary Cardiologist: Dr Oval Linsey  HPI: Patient is a 73 year old male who was just seen by Dr. Oval Linsey 06/23/2022 history of diastolic CHF, morbid obesity, sleep apnea on CPAP, hypertension, non-insulin-dependent diabetes, and DJD.  He had a low risk Myoview in June 2017.  His ejection fraction by Myoview was depressed and a follow-up echocardiogram was ordered, this revealed normal LV function.  She had him monitor his blood pressures and come back for follow-up.  The patient brought a computerized list of his blood pressure since his last office visit.  In general they look pretty good, he only had 4 5 blood pressures above 440 systolic.  The patient has chronic dyspnea on exertion.  He relates this to being out of shape.  He has DJD, has had prior back surgery and had right hip surgery in 2019.  He notes that his blood pressure is actually better when he is up and around during the day.  Because of generalized arthritic pain some days he is not very active.  He realizes he has to lose weight and we talked about this at length.  He says he has done it before and can do it again.  I offered to refer him to weight loss support group but he declined.   Current Outpatient Medications  Medication Sig Dispense Refill  . amLODipine (NORVASC) 10 MG tablet Take 10 mg by mouth every morning.     Marland Kitchen doxazosin (CARDURA) 2 MG tablet Take 1 tablet (2 mg total) by mouth 2 (two) times daily. TO BE TAKEN WITH THE 4 MG TABLETS 180 tablet 1  . doxazosin (CARDURA) 4 MG tablet Take 1 tablet (4 mg total) by mouth 2 (two) times daily. 180 tablet 1  . FIBER PO Take 2 tablets by mouth every morning.    . INVOKANA 100 MG TABS tablet     . ipratropium (ATROVENT) 0.03 % nasal spray Place 1 spray into both nostrils at bedtime as needed for rhinitis.     . metFORMIN (GLUCOPHAGE) 500 MG tablet Take 500 mg by mouth daily with  breakfast.     . methocarbamol (ROBAXIN) 500 MG tablet Take 1 tablet (500 mg total) by mouth every 6 (six) hours as needed for muscle spasms. 60 tablet 0  . mometasone (NASONEX) 50 MCG/ACT nasal spray Place 2 sprays into the nose daily.    . montelukast (SINGULAIR) 10 MG tablet Take 10 mg by mouth every morning.    . naproxen (NAPROSYN) 500 MG tablet Take 500 mg by mouth as needed.    . Nebivolol HCl (BYSTOLIC) 20 MG TABS TAKE 1 TABLET BY MOUTH ONCE DAILY 90 each 0  . valsartan-hydrochlorothiazide (DIOVAN-HCT) 320-25 MG per tablet Take 1 tablet by mouth every morning.     No current facility-administered medications for this visit.     Allergies  Allergen Reactions  . Micardis [Telmisartan] Hives and Rash    Past Medical History:  Diagnosis Date  . Allergy   . Anemia    "as teenager"  . Arthritis    hands, hip  . Back pain   . BPPV (benign paroxysmal positional vertigo)    History of  . Cancer ([place])    "melanoma removed from leg years ago", malignant mole  . Chronic back pain   . Complication of anesthesia    woke up and was confused and combative after back surgery  . Constipation   .  DDD (degenerative disc disease), lumbar   . Diabetes mellitus without complication ([place])   . Diabetic neuropathy ([place])   . ED (erectile dysfunction)   . Essential hypertension 10/16/2015  . Fatty liver   . First degree AV block    EKG 12/03/16  . GERD (gastroesophageal reflux disease)    diet controlled, no meds currently, history of  . H/O hiatal hernia   . History of elevated PSA   . Hypertension    sees Dr. Virgina Jock  . Nocturia more than twice per night 12/21/2013  . OA (osteoarthritis)   . Obesity hypoventilation syndrome ([place]) 04/07/2014  . Obesity, morbid ([place]) 12/21/2013  . OSA on CPAP 04/07/2014  . RBBB    previous EKG  . Sleep apnea    uses CPAP nightly  . Snoring 12/21/2013  . Spondylosis 2014   lumbar back, noted on MR   . Ulcer    stomach ulcer "as teenager"    Social  History   Socioeconomic History  . Marital status: Married    Spouse name: [name]  . Number of children: 2  . Years of education: Masters  . Highest education level: Not on file  Occupational History    Employer: RETIRED  Social Needs  . Financial resource strain: Not on file  . Food insecurity:    Worry: Not on file    Inability: Not on file  . Transportation needs:    Medical: Not on file    Non-medical: Not on file  Tobacco Use  . Smoking status: Never Smoker  . Smokeless tobacco: Never Used  Substance and Sexual Activity  . Alcohol use: No    Alcohol/week: 0.0 standard drinks  . Drug use: No  . Sexual activity: Not on file  Lifestyle  . Physical activity:    Days per week: Not on file    Minutes per session: Not on file  . Stress: Not on file  Relationships  . Social connections:    Talks on phone: Not on file    Gets together: Not on file    Attends religious service: Not on file    Active member of club or organization: Not on file    Attends meetings of clubs or organizations: Not on file    Relationship status: Not on file  . Intimate partner violence:    Fear of current or ex partner: Not on file    Emotionally abused: Not on file    Physically abused: Not on file    Forced sexual activity: Not on file  Other Topics Concern  . Not on file  Social History Narrative   Patient is married [name]) and lives at home with his wife.   Patient has two adult children.   Patient is retired.   Patient has a Master's degree.   Patient is right-handed   Patient does not drink any caffeine.     Family History  Problem Relation Age of Onset  . Dementia Mother   . Colon polyps Mother   . Heart failure Father   . Breast cancer Sister   . Heart attack Sister   . Thyroid disease Sister   . Colon cancer Neg Hx   . Esophageal cancer Neg Hx   . Rectal cancer Neg Hx   . Stomach cancer Neg Hx      Review of Systems: General: negative for chills, fever,  night sweats or weight changes.  Cardiovascular: negative for chest pain, dyspnea on exertion, edema, orthopnea,  palpitations, paroxysmal nocturnal dyspnea or shortness of breath Dermatological: negative for rash Respiratory: negative for cough or wheezing Urologic: negative for hematuria Abdominal: negative for nausea, vomiting, diarrhea, bright red blood per rectum, melena, or hematemesis Neurologic: negative for visual changes, syncope, or dizziness All other systems reviewed and are otherwise negative except as noted above.    Blood pressure (!) 142/78, pulse (!) 55, height 6\' 1"  (1.854 m), weight (!) 367 lb (166.5 kg), SpO2 94 %.  General appearance: alert, cooperative, no distress and morbidly obese Neck: no carotid bruit and no JVD Lungs: clear to auscultation bilaterally Heart: regular rate and rhythm Extremities: trace edema Skin: Skin color, texture, turgor normal. No rashes or lesions Neurologic: Grossly normal   ASSESSMENT AND PLAN:   Essential hypertension F/U B/Ps look good  Obesity, morbid BMI 48  OSA on CPAP Compliant  Non-insulin dependent type 2 diabetes mellitus ([place]) On Glucophage   PLAN  Same Rx. I encouraged him to move forward with his plans to loose weight.  F/U in May with Dr Oval Linsey.   Kerin Ransom PA-C 07/22/2018 9:56 AM

## 2018-07-22 NOTE — Assessment & Plan Note (Signed)
Compliant 

## 2018-07-22 NOTE — Patient Instructions (Signed)
Medication Instructions:  Your physician recommends that you continue on your current medications as directed. Please refer to the Current Medication list given to you today. If you need a refill on your cardiac medications before your next appointment, please call your pharmacy.   Lab work: NONE  If you have labs (blood work) drawn today and your tests are completely normal, you will receive your results only by: Marland Kitchen MyChart Message (if you have MyChart) OR . A paper copy in the mail If you have any lab test that is abnormal or we need to change your treatment, we will call you to review the results.  Testing/Procedures: NONE   Follow-Up: At [facility], you and your health needs are our priority.  As part of our continuing mission to provide you with exceptional heart care, we have created designated Provider Care Teams.  These Care Teams include your primary Cardiologist (physician) and Advanced Practice Providers (APPs -  Physician Assistants and Nurse Practitioners) who all work together to provide you with the care you need, when you need it. Marland Kitchen [facility] RECOMMENDS YOU FOLLOW UP AS SCHEDULED.  Any Other Special Instructions Will Be Listed Below (If Applicable).

## 2018-07-22 NOTE — Assessment & Plan Note (Signed)
On Glucophage 

## 2018-08-25 ENCOUNTER — Other Ambulatory Visit: Payer: Self-pay | Admitting: Cardiovascular Disease

## 2018-09-11 ENCOUNTER — Telehealth: Payer: Self-pay

## 2018-09-11 NOTE — Telephone Encounter (Signed)
Virtual Visit Pre-Appointment Phone Call [facility] PHONE "(Name), I am calling you today to discuss your upcoming appointment. We are currently trying to limit exposure to the virus that causes COVID-19 by seeing patients at home rather than in the office."  1. "What is the BEST phone number to call the day of the visit?" - include this in appointment notes  2. "Do you have or have access to (through a family member/friend) a smartphone with video capability that we can use for your visit?" a. If yes - list this number in appt notes as "cell" (if different from BEST phone #) and list the appointment type as a VIDEO visit in appointment notes b. If no - list the appointment type as a PHONE visit in appointment notes  3. Confirm consent - "In the setting of the current Covid19 crisis, you are scheduled for a (phone or video) visit with your provider on (date) at (time).  Just as we do with many in-office visits, in order for you to participate in this visit, we must obtain consent.  If you'd like, I can send this to your mychart (if signed up) or email for you to review.  Otherwise, I can obtain your verbal consent now.  All virtual visits are billed to your insurance company just like a normal visit would be.  By agreeing to a virtual visit, we'd like you to understand that the technology does not allow for your provider to perform an examination, and thus may limit your provider's ability to fully assess your condition. If your provider identifies any concerns that need to be evaluated in person, we will make arrangements to do so.  Finally, though the technology is pretty good, we cannot assure that it will always work on either your or our end, and in the setting of a video visit, we may have to convert it to a phone-only visit.  In either situation, we cannot ensure that we have a secure connection.  Are you willing to proceed?" STAFF: Did the patient verbally acknowledge consent to  telehealth visit? Document YES/NO here:   4. Advise patient to be prepared - "Two hours prior to your appointment, go ahead and check your blood pressure, pulse, oxygen saturation, and your weight (if you have the equipment to check those) and write them all down. When your visit starts, your provider will ask you for this information. If you have an Apple Watch or Kardia device, please plan to have heart rate information ready on the day of your appointment. Please have a pen and paper handy nearby the day of the visit as well."  5. Give patient instructions for MyChart download to smartphone OR Doximity/Doxy.me as below if video visit (depending on what platform provider is using)  6. Inform patient they will receive a phone call 15 minutes prior to their appointment time (may be from unknown caller ID) so they should be prepared to answer    TELEPHONE CALL NOTE  Tommy [name] has been deemed a candidate for a follow-up tele-health visit to limit community exposure during the Covid-19 pandemic. I spoke with the patient via phone to ensure availability of phone/video source, confirm preferred email & phone number, and discuss instructions and expectations.  I reminded Tommy [name] to be prepared with any vital sign and/or heart rhythm information that could potentially be obtained via home monitoring, at the time of his visit. I reminded Tommy [name] to expect a phone  call prior to his visit.  [place] 09/11/2018 12:03 PM   INSTRUCTIONS FOR DOWNLOADING THE MYCHART APP TO SMARTPHONE  - The patient must first make sure to have activated MyChart and know their login information - If Apple, go to [employer] and type in MyChart in the search bar and download the app. If Android, ask patient to go to [employer] and type in Ruth in the search bar and download the app. The app is free but as with any other app downloads, their phone may require them to verify  saved payment information or Apple/Android password.  - The patient will need to then log into the app with their MyChart username and password, and select [facility] as their healthcare provider to link the account. When it is time for your visit, go to the MyChart app, find appointments, and click Begin Video Visit. Be sure to Select Allow for your device to access the Microphone and Camera for your visit. You will then be connected, and your provider will be with you shortly.  **If they have any issues connecting, or need assistance please contact [facility] [phone])  **If using a computer, in order to ensure the best quality for their visit they will need to use either of the following Internet Browsers: [employer], or Google Chrome  IF USING DOXIMITY or DOXY.ME - The patient will receive a link just prior to their visit by text.     FULL LENGTH CONSENT FOR TELE-HEALTH VISIT   I hereby voluntarily request, consent and authorize [facility] and its employed or contracted physicians, physician assistants, nurse practitioners or other licensed health care professionals (the Practitioner), to provide me with telemedicine health care services (the "Services") as deemed necessary by the treating Practitioner. I acknowledge and consent to receive the Services by the Practitioner via telemedicine. I understand that the telemedicine visit will involve communicating with the Practitioner through live audiovisual communication technology and the disclosure of certain medical information by electronic transmission. I acknowledge that I have been given the opportunity to request an in-person assessment or other available alternative prior to the telemedicine visit and am voluntarily participating in the telemedicine visit.  I understand that I have the right to withhold or withdraw my consent to the use of telemedicine in the course of my care at any time, without  affecting my right to future care or treatment, and that the Practitioner or I may terminate the telemedicine visit at any time. I understand that I have the right to inspect all information obtained and/or recorded in the course of the telemedicine visit and may receive copies of available information for a reasonable fee.  I understand that some of the potential risks of receiving the Services via telemedicine include:  Marland Kitchen Delay or interruption in medical evaluation due to technological equipment failure or disruption; . Information transmitted may not be sufficient (e.g. poor resolution of images) to allow for appropriate medical decision making by the Practitioner; and/or  . In rare instances, security protocols could fail, causing a breach of personal health information.  Furthermore, I acknowledge that it is my responsibility to provide information about my medical history, conditions and care that is complete and accurate to the best of my ability. I acknowledge that Practitioner's advice, recommendations, and/or decision may be based on factors not within their control, such as incomplete or inaccurate data provided by me or distortions of diagnostic images or specimens that may result  from electronic transmissions. I understand that the practice of medicine is not an exact science and that Practitioner makes no warranties or guarantees regarding treatment outcomes. I acknowledge that I will receive a copy of this consent concurrently upon execution via email to the email address I last provided but may also request a printed copy by calling the office of [facility].    I understand that my insurance will be billed for this visit.   I have read or had this consent read to me. . I understand the contents of this consent, which adequately explains the benefits and risks of the Services being provided via telemedicine.  . I have been provided ample opportunity to ask questions regarding this  consent and the Services and have had my questions answered to my satisfaction. . I give my informed consent for the services to be provided through the use of telemedicine in my medical care  By participating in this telemedicine visit I agree to the above.

## 2018-09-24 ENCOUNTER — Telehealth: Payer: Self-pay | Admitting: Family Medicine

## 2018-09-24 NOTE — Telephone Encounter (Signed)
Due to current COVID 19 pandemic, our office is severely reducing in office visits until further notice, in order to minimize the risk to our patients and healthcare providers.   Called patient to offer a virtual visit with Amy NP due to Dr. Brett Fairy being out of office. Patient agreed to a virtual visit and verbalized understanding of the doxy process. Patient understands that he will receive an e-mail with Amy's doxy link as well as directions. My name and office number/hours were also included if patient has questions. Patient is aware that he will receive a call from nurse to update chart info.  Pt understands that although there may be some limitations with this type of visit, we will take all precautions to reduce any security or privacy concerns.  Pt understands that this will be treated like an in office visit and we will file with pt's insurance, and there may be a patient responsible charge related to this service.

## 2018-09-25 NOTE — Telephone Encounter (Signed)
E-mail has been sent with instructions on how to access his doxy.me visit with Amy on 10/15/2018.   E-mail: [email or web address]

## 2018-09-30 ENCOUNTER — Encounter: Payer: Self-pay | Admitting: Cardiovascular Disease

## 2018-09-30 ENCOUNTER — Telehealth (INDEPENDENT_AMBULATORY_CARE_PROVIDER_SITE_OTHER): Payer: Medicare Other | Admitting: Cardiovascular Disease

## 2018-09-30 DIAGNOSIS — I10 Essential (primary) hypertension: Secondary | ICD-10-CM

## 2018-09-30 DIAGNOSIS — I5032 Chronic diastolic (congestive) heart failure: Secondary | ICD-10-CM

## 2018-09-30 NOTE — Patient Instructions (Addendum)
Medication Instructions:  Your physician recommends that you continue on your current medications as directed. Please refer to the Current Medication list given to you today.  If you need a refill on your cardiac medications before your next appointment, please call your pharmacy.   Lab work: HAVE DR [facility] SEND COPY WHEN LABS ARE DONE  Testing/Procedures: NONE  Follow-Up: At [facility], you and your health needs are our priority.  As part of our continuing mission to provide you with exceptional heart care, we have created designated Provider Care Teams.  These Care Teams include your primary Cardiologist (physician) and Advanced Practice Providers (APPs -  Physician Assistants and Nurse Practitioners) who all work together to provide you with the care you need, when you need it. You will need a follow up appointment in 12 months.  Please call our office 2 months in advance to schedule this appointment.  You may see Skeet Latch, MD or one of the following Advanced Practice Providers on your designated Care Team:   Kerin Ransom, PA-C Roby Lofts, [place] . Sande Rives, PA-C

## 2018-09-30 NOTE — Progress Notes (Signed)
Virtual Visit via Video Note   This visit type was conducted due to national recommendations for restrictions regarding the COVID-19 Pandemic (e.g. social distancing) in an effort to limit this patient's exposure and mitigate transmission in our community.  Due to his co-morbid illnesses, this patient is at least at moderate risk for complications without adequate follow up.  This format is felt to be most appropriate for this patient at this time.  All issues noted in this document were discussed and addressed.  A limited physical exam was performed with this format.  Please refer to the patient's chart for his consent to telehealth for [facility].   Date:  09/30/2018   ID:  Dorathy Daft, DOB [id number], MRN [id number]  Patient Location: Home Provider Location: Office  PCP:  Shon Baton, MD  Cardiologist:  Skeet Latch, MD  Electrophysiologist:  None  Evaluation Performed:  Follow-Up Visit  Chief Complaint:  hypertension  History of Present Illness:    EISSA BUCHBERGER is a 73 y.o. male with asthma, hypertension, GERD, fatty liver disease, OSA on CPAP and morbid obesity who presents for follow up on shortness of breath. Mr. Fecher saw his PCP, Dr. Shon Baton, on 09/20/15. At that appointment it was noted that a chest x-ray in January 2014 showed a tortuous aorta and calcifications of the thoracic aorta. He also reported dyspnea on exertion. He was referred to cardiology for further evaluation.  He was referred for a Lexiscan Myoview 10/2015 that was negative for ischemia but the ejection fraction was reported as 30-44%. He subsequently had an echocardiogram that revealed normal systolic function and mild LVH.  Diastolic function was abnormal but the degree of dysfunction was not commented upon and the echo is not currently available for review.  At his last appointment Mr. Frein's BP remained elevated so doxazosin was increased to 6mg  bid.  Since that time his blood  pressure has been much better.  It usually is in the 120s to 130s before his morning medications.  The highest he has seen is in the 140s.  He has not experienced any chest pain.  He continues to have some exertional dyspnea but he attributes this to not moving much.  He has been home due to COVID-19.  This is also made it more difficult for him to change his diet.  He struggles with joint pain that prohibits him from moving much.  He notes that in the past he did well with gabapentin and wonders if he could take this again.  He has been trying to do some yard work but gets tired after about 20 minutes.  He denies lower extremity edema, orthopnea, or PND.  He was supposed to see his primary care doctor later this month but this is been postponed until October.   The patient does not have symptoms concerning for COVID-19 infection (fever, chills, cough, or new shortness of breath).    Past Medical History:  Diagnosis Date  . Allergy   . Anemia    "as teenager"  . Arthritis    hands, hip  . Back pain   . BPPV (benign paroxysmal positional vertigo)    History of  . Cancer ([place])    "melanoma removed from leg years ago", malignant mole  . Chronic back pain   . Complication of anesthesia    woke up and was confused and combative after back surgery  . Constipation   . DDD (degenerative disc disease), lumbar   . Diabetes  mellitus without complication ([place])   . Diabetic neuropathy ([place])   . ED (erectile dysfunction)   . Essential hypertension 10/16/2015  . Fatty liver   . First degree AV block    EKG 12/03/16  . GERD (gastroesophageal reflux disease)    diet controlled, no meds currently, history of  . H/O hiatal hernia   . History of elevated PSA   . Hypertension    sees Dr. Virgina Jock  . Nocturia more than twice per night 12/21/2013  . OA (osteoarthritis)   . Obesity hypoventilation syndrome ([place]) 04/07/2014  . Obesity, morbid ([place]) 12/21/2013  . OSA on CPAP 04/07/2014  . RBBB    previous EKG   . Sleep apnea    uses CPAP nightly  . Snoring 12/21/2013  . Spondylosis 2014   lumbar back, noted on MR   . Ulcer    stomach ulcer "as teenager"   Past Surgical History:  Procedure Laterality Date  . BACK SURGERY     "done approx. 24 years ago"  . CARDIOVASCULAR STRESS TEST     "done approx 10 years ago, Dr. Virgina Jock"  . COLONOSCOPY  2006   hx of "with removal of polyps"/[facility]  . COLONOSCOPY WITH PROPOFOL N/A 09/06/2015   Procedure: COLONOSCOPY WITH PROPOFOL;  Surgeon: Ladene Artist, MD;  Location: [facility];  Service: Endoscopy;  Laterality: N/A;  . lipoma removed     from leg  . LUMBAR LAMINECTOMY/DECOMPRESSION MICRODISCECTOMY  06/19/2012   Procedure: LUMBAR LAMINECTOMY/DECOMPRESSION MICRODISCECTOMY 1 LEVEL;  Surgeon: Melina Schools, MD;  Location: [facility];  Service: Orthopedics;  Laterality: Left;  L2-3 LEFT MICRODISCECTOMY  . thigh surgery     fatty deposit  . TONSILLECTOMY    . TOTAL HIP ARTHROPLASTY Right 06/05/2017   Procedure: RIGHT TOTAL HIP ARTHROPLASTY ANTERIOR APPROACH;  Surgeon: Gaynelle Arabian, MD;  Location: [facility];  Service: Orthopedics;  Laterality: Right;  Dr. requested 120 minutes  . WISDOM TOOTH EXTRACTION       Current Meds  Medication Sig  . amLODipine (NORVASC) 10 MG tablet Take 10 mg by mouth every morning.   Marland Kitchen aspirin EC 81 MG tablet Take 81 mg by mouth daily.  Marland Kitchen doxazosin (CARDURA) 2 MG tablet Take 1 tablet (2 mg total) by mouth 2 (two) times daily. TO BE TAKEN WITH THE 4 MG TABLETS  . doxazosin (CARDURA) 4 MG tablet Take 1 tablet (4 mg total) by mouth 2 (two) times daily.  Marland Kitchen FIBER PO Take 3 tablets by mouth every morning.   . Glucosamine-Chondroit-Vit C-Mn (GLUCOSAMINE 1500 COMPLEX PO) Take by mouth.  . INVOKANA 100 MG TABS tablet   . ipratropium (ATROVENT) 0.03 % nasal spray Place 1 spray into both nostrils at bedtime as needed for rhinitis.   . metFORMIN (GLUCOPHAGE) 500 MG tablet Take 500 mg by mouth daily with breakfast.   . methocarbamol (ROBAXIN)  500 MG tablet Take 1 tablet (500 mg total) by mouth every 6 (six) hours as needed for muscle spasms.  . mometasone (NASONEX) 50 MCG/ACT nasal spray Place 2 sprays into the nose daily.  . montelukast (SINGULAIR) 10 MG tablet Take 10 mg by mouth every morning.  . Multiple Vitamin (MULTIVITAMIN) capsule Take 1 capsule by mouth daily.  . naproxen (NAPROSYN) 500 MG tablet Take 500 mg by mouth as needed.  . Nebivolol HCl (BYSTOLIC) 20 MG TABS Take 1 tablet by mouth once daily  . valsartan-hydrochlorothiazide (DIOVAN-HCT) 320-25 MG per tablet Take 1 tablet by mouth every morning.  Allergies:   Micardis [telmisartan]   Social History   Tobacco Use  . Smoking status: Never Smoker  . Smokeless tobacco: Never Used  Substance Use Topics  . Alcohol use: No    Alcohol/week: 0.0 standard drinks  . Drug use: No     Family Hx: The patient's family history includes Breast cancer in his sister; Colon polyps in his mother; Dementia in his mother; Heart attack in his sister; Heart failure in his father; Thyroid disease in his sister. There is no history of Colon cancer, Esophageal cancer, Rectal cancer, or Stomach cancer.  ROS:   Please see the history of present illness.    All other systems reviewed and are negative.   Prior CV studies:   The following studies were reviewed today:  Echo 11/03/15: Study Conclusions  - Procedure narrative: Transthoracic echocardiography. Image  quality was suboptimal. The study was technically difficult.  Intravenous contrast (Definity) was administered. - Left ventricle: The cavity size was mildly dilated. Wall  thickness was increased in a pattern of mild LVH. Systolic  function was normal. The estimated ejection fraction was in the  range of 55% to 60%. Wall motion was normal; there were no  regional wall motion abnormalities. Acoustic contrast  opacification revealed no evidence ofthrombus. - Left atrium: The atrium was mildly dilated. - Right  ventricle: The cavity size was mildly dilated.   Lexiscan Myoview 11/03/15:  Nuclear stress EF: 44%.  There was no ST segment deviation noted during stress.  No T wave inversion was noted during stress.  Defect 1: There is a small defect of moderate severity present in the apex location. Apical thinning vs prior infarct.  This is an intermediate risk study due to reduced LVEF.  The left ventricular ejection fraction is moderately decreased (30-44%). No ischemia.Echo 11/03/15: Study Conclusions  - Procedure narrative: Transthoracic echocardiography. Image  quality was suboptimal. The study was technically difficult.  Intravenous contrast (Definity) was administered. - Left ventricle: The cavity size was mildly dilated. Wall  thickness was increased in a pattern of mild LVH. Systolic  function was normal. The estimated ejection fraction was in the  range of 55% to 60%. Wall motion was normal; there were no  regional wall motion abnormalities. Acoustic contrast  opacification revealed no evidence ofthrombus. - Left atrium: The atrium was mildly dilated. - Right ventricle: The cavity size was mildly dilated.   Lexiscan Myoview 11/03/15:  Nuclear stress EF: 44%.  There was no ST segment deviation noted during stress.  No T wave inversion was noted during stress.  Defect 1: There is a small defect of moderate severity present in the apex location. Apical thinning vs prior infarct.  This is an intermediate risk study due to reduced LVEF.  The left ventricular ejection fraction is moderately decreased (30-44%).  No ischemia.  Labs/Other Tests and Data Reviewed:    EKG:  No ECG reviewed.  Recent Labs: No results found for requested labs within last 8760 hours.   Recent Lipid Panel No results found for: CHOL, TRIG, HDL, CHOLHDL, LDLCALC, LDLDIRECT  Wt Readings from Last 3 Encounters:  09/30/18 (!) 361 lb (163.7 kg)  07/22/18 (!) 367 lb (166.5 kg)  06/23/18  (!) 368 lb 12.8 oz (167.3 kg)     Objective:    BP 128/72   Pulse (!) 57   Ht 6' 1.5" (1.867 m)   Wt (!) 361 lb (163.7 kg)   BMI 46.98 kg/m  GENERAL: Well-appearing.  No acute distress. HEENT:  Pupils equal round.  Oral mucosa unremarkable NECK:  No jugular venous distention, no visible thyromegaly EXT:  1+ LE edema bilaterally, no cyanosis no clubbing SKIN:  No rashes no nodules NEURO:  Speech fluent.  Cranial nerves grossly intact.  Moves all 4 extremities freely PSYCH:  Cognitively intact, oriented to person place and time   ASSESSMENT & PLAN:    # Shortness of breath:  # Chronic diastolic heart failure: Normal systolic function on echo.  Likely diastolic heart failure.  His shortness of breath is mostly attributable to obesity and deconditioning.    He notes that it has been worse lately due to not being as active.  He is going to work on opening his pool and plans to contact his primary care doctor about pain control so that he can move more.  # Hypertension:  Blood pressure is much better-controlled.  Continue amlodipine, doxazosin, nebivolol, valsartan, and hydrochlorothiazide.  # CV Disease Prevention:   Lipids are followed by his PCP.  # Morbid obesity: Discussed the importance of getting back to the dietary changes that worked well for him in the past.  He is in the pre contemplative phase.  He previously was seen in weight loss clinic at [facility] and doesn't want to do that at [facility].  COVID-19 Education: The signs and symptoms of COVID-19 were discussed with the patient and how to seek care for testing (follow up with PCP or arrange E-visit).  The importance of social distancing was discussed today.  Time:   Today, I have spent 17 minutes with the patient with telehealth technology discussing the above problems.     Medication Adjustments/Labs and Tests Ordered: Current medicines are reviewed at length with the patient today.  Concerns regarding medicines are  outlined above.   Tests Ordered: No orders of the defined types were placed in this encounter.   Medication Changes: No orders of the defined types were placed in this encounter.   Disposition:  Follow up in 1 year(s)  Signed, Skeet Latch, MD  09/30/2018 10:33 AM    [place]

## 2018-10-14 ENCOUNTER — Ambulatory Visit: Payer: Medicare Other | Admitting: Neurology

## 2018-10-15 ENCOUNTER — Encounter: Payer: Self-pay | Admitting: Family Medicine

## 2018-10-15 ENCOUNTER — Ambulatory Visit (INDEPENDENT_AMBULATORY_CARE_PROVIDER_SITE_OTHER): Payer: Medicare Other | Admitting: Family Medicine

## 2018-10-15 ENCOUNTER — Other Ambulatory Visit: Payer: Self-pay

## 2018-10-15 DIAGNOSIS — Z9989 Dependence on other enabling machines and devices: Secondary | ICD-10-CM

## 2018-10-15 DIAGNOSIS — G4733 Obstructive sleep apnea (adult) (pediatric): Secondary | ICD-10-CM
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
## 2018-10-28 ENCOUNTER — Other Ambulatory Visit: Payer: Self-pay | Admitting: Cardiovascular Disease

## 2018-12-08 ENCOUNTER — Other Ambulatory Visit: Payer: Self-pay | Admitting: Cardiovascular Disease

## 2018-12-27 ENCOUNTER — Other Ambulatory Visit: Payer: Self-pay | Admitting: Cardiovascular Disease

## 2018-12-30 NOTE — Telephone Encounter (Signed)
Pt is taking Dosazosin 4 mg tablet BID.  Request received was incorrect instructions.

## 2018-12-30 NOTE — Telephone Encounter (Signed)
Error- Doxazosin 4 mg tablet BID.

## 2019-04-27 ENCOUNTER — Other Ambulatory Visit: Payer: Self-pay | Admitting: Cardiovascular Disease

## 2019-05-25 ENCOUNTER — Other Ambulatory Visit: Payer: Self-pay | Admitting: Cardiovascular Disease

## 2019-07-05 ENCOUNTER — Ambulatory Visit: Payer: Medicare Other

## 2019-08-31 ENCOUNTER — Other Ambulatory Visit: Payer: Self-pay | Admitting: Cardiovascular Disease

## 2019-09-22 DIAGNOSIS — E662 Morbid (severe) obesity with alveolar hypoventilation: Secondary | ICD-10-CM | POA: Diagnosis not present

## 2019-09-22 DIAGNOSIS — G629 Polyneuropathy, unspecified: Secondary | ICD-10-CM | POA: Diagnosis not present

## 2019-09-22 DIAGNOSIS — E1140 Type 2 diabetes mellitus with diabetic neuropathy, unspecified: Secondary | ICD-10-CM | POA: Diagnosis not present

## 2019-09-22 DIAGNOSIS — M1990 Unspecified osteoarthritis, unspecified site: Secondary | ICD-10-CM | POA: Diagnosis not present

## 2019-09-22 DIAGNOSIS — I10 Essential (primary) hypertension: Secondary | ICD-10-CM | POA: Diagnosis not present

## 2019-09-22 DIAGNOSIS — G4733 Obstructive sleep apnea (adult) (pediatric): Secondary | ICD-10-CM | POA: Diagnosis not present

## 2019-09-22 DIAGNOSIS — E786 Lipoprotein deficiency: Secondary | ICD-10-CM | POA: Diagnosis not present

## 2019-09-22 DIAGNOSIS — I5189 Other ill-defined heart diseases: Secondary | ICD-10-CM | POA: Diagnosis not present

## 2019-09-22 DIAGNOSIS — H9313 Tinnitus, bilateral: Secondary | ICD-10-CM | POA: Diagnosis not present

## 2019-09-23 ENCOUNTER — Other Ambulatory Visit: Payer: Self-pay

## 2019-09-23 ENCOUNTER — Ambulatory Visit: Payer: Medicare PPO | Admitting: Family Medicine

## 2019-09-23 ENCOUNTER — Encounter: Payer: Self-pay | Admitting: Family Medicine

## 2019-09-23 VITALS — BP 148/80 | HR 58 | Temp 97.0°F | Ht 73.5 in | Wt 362.2 lb

## 2019-09-23 DIAGNOSIS — Z9989 Dependence on other enabling machines and devices: Secondary | ICD-10-CM

## 2019-09-23 DIAGNOSIS — G4733 Obstructive sleep apnea (adult) (pediatric): Secondary | ICD-10-CM | POA: Diagnosis not present

## 2019-09-23 NOTE — Progress Notes (Signed)
PATIENT: Tommy [name] Tommy [name] [name] DOB: 02-09-1946  REASON FOR VISIT: follow up HISTORY FROM: patient  Chief Complaint  Patient presents with  . Follow-up    rm corner, DME [facility]     HISTORY OF PRESENT ILLNESS: Tommy [name] [name] 09/24/19 Tommy [name] Tommy [name] [name] is a 74 y.o. male here Tommy [name] [name] for follow up for OSA on CPAP.  He is doing very well Tommy [name] [name].  He continues CPAP therapy nightly and for greater than 4 hours each night.  He reports he cannot sleep without using CPAP.  He has noted that his machine does not power on at times.  He has to press the power button several times before it will start.  His machine is approximately 74 years old.  He would like to consider getting a new CPAP machine.  Otherwise he is doing very well and has no concerns.  Compliance report dated 08/23/2019 through 09/21/2019 reveals that he is used CPAP therapy every night for compliance of 100%.  He has used CPAP 4 hours every night.  Average usage was 6 hours and 33 minutes.  Residual AHI was 3.0 on 10 to 16 cm of water and an EPR of 3.  There was no significant leak noted.  HISTORY: (copied from my note on 10/12/2018)  Tommy [name] Tommy [name] [name] is a 74 y.o. male for follow up for OSA on CPAP. He continues to do well with therapy and has no concerns Tommy [name] [name].    09/15/2018 - 10/14/2018  - 10/14/2018 Usage days 30/30 days (100%) >= 4 hours 30 days (100%) < 4 hours 0 days (0%) Usage hours 203 hours 26 minutes Average usage (total days) 6 hours 47 minutes Average usage (days used) 6 hours 47 minutes Median usage (days used) 6 hours 48 minutes Total used hours (value since last reset - 10/14/2018) 11,335 hours AirSense 10 AutoSet Serial number [id number] Mode AutoSet Min Pressure 10 cmH2O Max Pressure 16 cmH2O EPR Fulltime EPR level 3 Therapy Pressure - cmH2O Median: 11.0 95th percentile: 12.5 Maximum: 13.6 Leaks - L/min Median: 3.4 95th percentile: 12.1 Maximum: 16.4 Events per hour AI: 2.2 HI: 0.6 AHI: 2.8 Apnea  Index Central: 0.8 Obstructive: 1.3 Unknown: 0.0 RERA Index 0.0 Cheyne-Stokes respiration (average duration per night) 10 minutes (3%)  History (copied from Dr Dohmeier's note on 10/10/2017)   Interval history from 10 Oct 2017, I have the pleasure of meeting with Tommy [name] Tommy [name] [name]. Tommy [name] Tommy [name] [name] who underwent a replacement surgery under the guidance of Dr. Posey Pronto. His right hip is now healed recovered and he is fairly mobile. He reports that in the last 6 weeks he has done more project in the whole year before. In order to get ready for surgery and limit his surgical risk he was able to lose weight but he has already gained back 20 pounds. But it was previously hip pain that interrupted and fragmented his sleep it is now nocturia. He states that he has no longer blocks of 4 interrupted interrupted hours of sleep but only about 2.  He has been as in the years before 100% compliant with CPAP he is using an AutoSet between 10 and 16 cmH2O with 3 cm EPR and this window covers his current pressure needs. His pressure at the 95th percentile is 11.9 cmH2O so well was in the window. Residual apnea and hypercapnia index AHI was 1.5/h and they were 3 minutes or 1% of the night of Cheyne-Stokes respirations. He takes a muscle relaxant at night.  DME is [facility]-mask type  nasal pillow, air fit P 10  Epworth 5 points, not bad. FSS at 35, geriatric depression score is 2/15. He sleeps so much better with CPAP, noted the difference when his home lost power.   Fam history: Brother has now OSA with CPAP. Patient reports vivid dreams in AM - not enacting them.   Interval history from 10/10/2016 area at the pleasure of seeing Tommy [name] Tommy [name] Tommy [name] [name], who is diabetic, hypertensive and was diagnosed with obstructive sleep apnea in 2015. His diabetes is currently treated withinvokana-this causes some nocturia. He is compliant with his CPAP use, uses an AutoSet between 10 and 16 cm water with 3 cm EPR achieving a  residual AHI of 1.2 apneas per hour. His 100% compliant by days and hours of use was an average user time of 6 hours and 50 minutes. He is using an air sense machine by [employer] and nasal pillows. No adjustments have to be made on this side. He still uses CPAP without ramp function and with a high starting pressure. I will repeat an ONO to see if he still needs oxygen. He likes to travel and oxygen need complicates things. He has hip pain, awaiting a consultation with Dr. Maureen Ralphs.  His wife is on CPAP, too.    HISTORY OF PRESENT ILLNESS: Tommy [name] Tommy [name] [name] is a 74 y.o.caucasian , right handed male , who is seen here as a referral from Dr. Virgina Jock for sleep evaluation.  The patient Is a retired Designer, jewellery, Music therapist. He has sleep problems of nocturia, snoring and GERD in sleep, EDS . He also has a history of osteoarthritis allergic rhinitis, GERD, hypertension, fatty liver disease. He has a family history of heart disease or hypertension. Tommy [name] Tommy [name] [name] reports that she had back surgery ( laminectomy in level L4-L5 ) and had reported to his orthopedists that he was excessively daytime sleepy and had trouble staying awake when driving longer distances. Dr. Rolena Infante asked him to make his primary care physician aware of this problem.  Prior to his back surgery in 2014 the patient was used to sleep in a recliner mainly because he could not find a comfortable position in bed. Since his back surgery this has improved tremendously and he is now sleeping in a regular bed. He has aches and pain when exercising, gained weight even after back surgery, he gained 60 pounds before surgery in only 3 years. He is frequently short of breath when just speaking, not exercising.He has a sister with heart disease, a brother with obstructive sleep apnea , mother but was diagnosed as obstructive sleep apnea and his father is deceased with congestive heart failure.   Interval history 04-07-14 CD [name] underwent  a split night polysomnography on 02-01-14 after endorsing the Epworth Sleepiness Scale at 16 points and presenting with a neck circumference of 20 inches and a BMI of 48.5. The patient's AHI was 43.1 he did not have any REM sleep, he did not sleep supine for these 2 hours of diagnostic study - his CO2 increased to 54.44. His oxygen nadir was 84% for 43.1 minutes the patient was titrated and interestingly his oxygen saturations actually decreased further 164 minutes of desaturations were found during the CPAP titration with a nadir at 71% [name]. And he had nocturia several times at night. He was fitted with an O2 pressure window between 10 and 16 cm water. We also recommended possible oxygen therapy should his hypoxemia continue on CPAP. Tommy [name] [name] is  the patient's first visit after sleep study and he has noted some remarkable differences for example his Epworth Sleepiness Scale is now 10 and his fatigue severity score 41 points. The first 30 days of use he struggled with his CPAP and mostly slept in a recliner. He now has adjusted quite well at 30 day download dated 04-05-16 chills 5 hours and 33 minutes of daily use 100% compliance of full-time EPR level of 3 cm water and a pressure of 12.9 cm water at the 91st percentile. His residual AHI is 1.3. There were no Cheyne-Stokes respirations noted. An adjustment of settings is not necessary but the patient wishes to not have a ramp function that. He has likely to use some additional oxygen. ONO showed on CPAP low oxygenation. He has taken off the RAMP function and reduced the humidity.  He has not fallen asleep in church since being on CPAP.    REVIEW OF SYSTEMS: Out of a complete 14 system review of symptoms, the patient complains only of the following symptoms, none and all other reviewed systems are negative.  ESS: 9 FSS: 33  ALLERGIES: Allergies  Allergen Reactions  . Micardis [Telmisartan] Hives and Rash     HOME MEDICATIONS: Outpatient Medications Prior to Visit  Medication Sig Dispense Refill  . amLODipine (NORVASC) 10 MG tablet Take 10 mg by mouth every morning.     Marland Kitchen aspirin EC 81 MG tablet Take 81 mg by mouth daily.    Marland Kitchen BYSTOLIC 20 MG TABS Take 1 tablet by mouth once daily 30 tablet 1  . doxazosin (CARDURA) 2 MG tablet TAKE 1 TABLET BY MOUTH 2 TIMES DAILY. TO BE TAKEN WITH THE 4 MG TABLETS 180 tablet 3  . doxazosin (CARDURA) 4 MG tablet TAKE 1 TABLET BY MOUTH 2 TIMES DAILY. 180 tablet 1  . FIBER PO Take 3 tablets by mouth every morning.     . gabapentin (NEURONTIN) 100 MG capsule gabapentin 100 mg capsule    . Glucosamine-Chondroit-Vit C-Mn (GLUCOSAMINE 1500 COMPLEX PO) Take by mouth.    . INVOKANA 100 MG TABS tablet     . ipratropium (ATROVENT) 0.03 % nasal spray Place 1 spray into both nostrils at bedtime as needed for rhinitis.     . metFORMIN (GLUCOPHAGE) 500 MG tablet Take 500 mg by mouth daily with breakfast.     . methocarbamol (ROBAXIN) 500 MG tablet Take 1 tablet (500 mg total) by mouth every 6 (six) hours as needed for muscle spasms. 60 tablet 0  . mometasone (NASONEX) 50 MCG/ACT nasal spray Place 2 sprays into the nose daily.    . montelukast (SINGULAIR) 10 MG tablet Take 10 mg by mouth every morning.    . Multiple Vitamin (MULTIVITAMIN) capsule Take 1 capsule by mouth daily.    . naproxen (NAPROSYN) 500 MG tablet Take 500 mg by mouth as needed.    Marland Kitchen oxyCODONE-acetaminophen (PERCOCET/ROXICET) 5-325 MG tablet oxycodone-acetaminophen 5 mg-325 mg tablet    . rosuvastatin (CRESTOR) 5 MG tablet rosuvastatin 5 mg tablet    . valsartan-hydrochlorothiazide (DIOVAN-HCT) 320-25 MG per tablet Take 1 tablet by mouth every morning.     No facility-administered medications prior to visit.    PAST MEDICAL HISTORY: Past Medical History:  Diagnosis Date  . Allergy   . Anemia    "as teenager"  . Arthritis    hands, hip  . Back pain   . BPPV (benign paroxysmal positional vertigo)     History of  . Cancer ([place])    "  melanoma removed from leg years ago", malignant mole  . Chronic back pain   . Complication of anesthesia    woke up and was confused and combative after back surgery  . Constipation   . DDD (degenerative disc disease), lumbar   . Diabetes mellitus without complication ([place])   . Diabetic neuropathy ([place])   . ED (erectile dysfunction)   . Essential hypertension 10/16/2015  . Fatty liver   . First degree AV block    EKG 12/03/16  . GERD (gastroesophageal reflux disease)    diet controlled, no meds currently, history of  . H/O hiatal hernia   . History of elevated PSA   . Hypertension    sees Dr. Virgina Jock  . Nocturia more than twice per night 12/21/2013  . OA (osteoarthritis)   . Obesity hypoventilation syndrome ([place]) 04/07/2014  . Obesity, morbid ([place]) 12/21/2013  . OSA on CPAP 04/07/2014  . RBBB    previous EKG  . Sleep apnea    uses CPAP nightly  . Snoring 12/21/2013  . Spondylosis 2014   lumbar back, noted on MR   . Ulcer    stomach ulcer "as teenager"    PAST SURGICAL HISTORY: Past Surgical History:  Procedure Laterality Date  . BACK SURGERY     "done approx. 24 years ago"  . CARDIOVASCULAR STRESS TEST     "done approx 10 years ago, Dr. Virgina Jock"  . COLONOSCOPY  2006   hx of "with removal of polyps"/[facility]  . COLONOSCOPY WITH PROPOFOL N/A 09/06/2015   Procedure: COLONOSCOPY WITH PROPOFOL;  Surgeon: Ladene Artist, MD;  Location: [facility];  Service: Endoscopy;  Laterality: N/A;  . lipoma removed     from leg  . LUMBAR LAMINECTOMY/DECOMPRESSION MICRODISCECTOMY  06/19/2012   Procedure: LUMBAR LAMINECTOMY/DECOMPRESSION MICRODISCECTOMY 1 LEVEL;  Surgeon: Melina Schools, MD;  Location: [facility];  Service: Orthopedics;  Laterality: Left;  L2-3 LEFT MICRODISCECTOMY  . thigh surgery     fatty deposit  . TONSILLECTOMY    . TOTAL HIP ARTHROPLASTY Right 06/05/2017   Procedure: RIGHT TOTAL HIP ARTHROPLASTY ANTERIOR APPROACH;  Surgeon: Gaynelle Arabian, MD;   Location: [facility];  Service: Orthopedics;  Laterality: Right;  Dr. requested 120 minutes  . WISDOM TOOTH EXTRACTION      FAMILY HISTORY: Family History  Problem Relation Age of Onset  . Dementia Mother   . Colon polyps Mother   . Heart failure Father   . Breast cancer Sister   . Heart attack Sister   . Thyroid disease Sister   . Colon cancer Neg Hx   . Esophageal cancer Neg Hx   . Rectal cancer Neg Hx   . Stomach cancer Neg Hx     SOCIAL HISTORY: Social History   Socioeconomic History  . Marital status: Married    Spouse name: [name]  . Number of children: 2  . Years of education: Masters  . Highest education level: Not on file  Occupational History    Employer: RETIRED  Tobacco Use  . Smoking status: Never Smoker  . Smokeless tobacco: Never Used  Substance and Sexual Activity  . Alcohol use: No    Alcohol/week: 0.0 standard drinks  . Drug use: No  . Sexual activity: Not on file  Other Topics Concern  . Not on file  Social History Narrative   Patient is married [name]) and lives at home with his wife.   Patient has two adult children.   Patient is retired.   Patient has a  Master's degree.   Patient is right-handed   Patient does not drink any caffeine.   Social Determinants of Health   Financial Resource Strain:   . Difficulty of Paying Living Expenses:   Food Insecurity:   . Worried About Charity fundraiser in the Last Year:   . Arboriculturist in the Last Year:   Transportation Needs:   . Film/video editor (Medical):   Marland Kitchen Lack of Transportation (Non-Medical):   Physical Activity:   . Days of Exercise per Week:   . Minutes of Exercise per Session:   Stress:   . Feeling of Stress :   Social Connections:   . Frequency of Communication with Friends and Family:   . Frequency of Social Gatherings with Friends and Family:   . Attends Religious Services:   . Active Member of Clubs or Organizations:   . Attends Archivist Meetings:     Marland Kitchen Marital Status:   Intimate Partner Violence:   . Fear of Current or Ex-Partner:   . Emotionally Abused:   Marland Kitchen Physically Abused:   . Sexually Abused:       PHYSICAL EXAM  Vitals:   09/23/19 1431  BP: (!) 148/80  Pulse: (!) 58  Temp: (!) 97 F (36.1 C)  SpO2: 94%  Weight: (!) 362 lb 3.2 oz (164.3 kg)  Height: 6' 1.5" (1.867 m)   Body mass index is 47.14 kg/m.  Generalized: Well developed, in no acute distress  Cardiology: normal rate and rhythm, no murmur noted Respiratory: clear to auscultation bilaterally  Neurological examination  Mentation: Alert oriented to time, place, history taking. Follows all commands speech and language fluent Cranial nerve II-XII: Pupils were equal round reactive to light. Extraocular movements were full, visual field were full  Motor: The motor testing reveals 5 over 5 strength of all 4 extremities. Good symmetric motor tone is noted throughout.  Gait and station: Gait is normal.    DIAGNOSTIC DATA (LABS, IMAGING, TESTING) - I reviewed patient records, labs, notes, testing and imaging myself where available.  No flowsheet data found.   Lab Results  Component Value Date   WBC 17.2 (H) 06/06/2017   HGB 13.9 06/06/2017   HCT 41.8 06/06/2017   MCV 90.3 06/06/2017   PLT 248 06/06/2017      Component Value Date/Time   NA 139 06/06/2017 0601   K 3.6 06/06/2017 0601   CL 101 06/06/2017 0601   CO2 28 06/06/2017 0601   GLUCOSE 155 (H) 06/06/2017 0601   BUN 15 06/06/2017 0601   CREATININE 0.72 06/06/2017 0601   CALCIUM 9.0 06/06/2017 0601   PROT 7.4 05/30/2017 0902   ALBUMIN 4.2 05/30/2017 0902   AST 21 05/30/2017 0902   ALT 17 05/30/2017 0902   ALKPHOS 115 05/30/2017 0902   BILITOT 1.2 05/30/2017 0902   GFRNONAA >60 06/06/2017 0601   GFRAA >60 06/06/2017 0601   No results found for: CHOL, HDL, LDLCALC, LDLDIRECT, TRIG, CHOLHDL Lab Results  Component Value Date   HGBA1C 5.2 05/30/2017   No results found for: VITAMINB12 No  results found for: TSH     ASSESSMENT AND PLAN 74 y.o. year old male  has a past medical history of Allergy, Anemia, Arthritis, Back pain, BPPV (benign paroxysmal positional vertigo), Cancer ([facility]), Chronic back pain, Complication of anesthesia, Constipation, DDD (degenerative disc disease), lumbar, Diabetes mellitus without complication ([place]), Diabetic neuropathy ([place]), ED (erectile dysfunction), Essential hypertension (10/16/2015), Fatty liver, First degree AV block, GERD (  gastroesophageal reflux disease), H/O hiatal hernia, History of elevated PSA, Hypertension, Nocturia more than twice per night (12/21/2013), OA (osteoarthritis), Obesity hypoventilation syndrome ([place]) (04/07/2014), Obesity, morbid ([place]) (12/21/2013), OSA on CPAP (04/07/2014), RBBB, Sleep apnea, Snoring (12/21/2013), Spondylosis (2014), and Ulcer. here with     ICD-10-CM   1. OSA on CPAP  G47.33 For home use only DME continuous positive airway pressure (CPAP)   Z99.89 For home use only DME continuous positive airway pressure (CPAP)    Lebert is doing well with CPAP therapy at home.  Compliance report reveals excellent compliance.  He was encouraged to continue using CPAP nightly and for greater than 4 hours each night.  He is having some difficulty with his machine.  Machine is greater than 8 years old.  We will place orders Tommy [name] [name] for a new CPAP machine.  He was advised that we will need to see him back within 31 to 90 days following initiation of new CPAP.  Healthy lifestyle habits with well-balanced diet regular exercise advised.  He will return to see me pending initiation of new CPAP.  He verbalizes understanding and agreement with this plan.   Orders Placed This Encounter  Procedures  . For home use only DME continuous positive airway pressure (CPAP)    Supplies    Order Specific Question:   Length of Need    Answer:   Lifetime    Order Specific Question:   Patient has OSA or probable OSA    Answer:   Yes    Order Specific  Question:   Is the patient currently using CPAP in the home    Answer:   Yes    Order Specific Question:   Settings    Answer:   Other see comments    Order Specific Question:   CPAP supplies needed    Answer:   Mask, headgear, cushions, filters, heated tubing and water chamber  . For home use only DME continuous positive airway pressure (CPAP)    Patient needs new CPAP machine, current machine > 5 years and having difficulty with power button.    Order Specific Question:   Length of Need    Answer:   Lifetime    Order Specific Question:   Patient has OSA or probable OSA    Answer:   Yes    Order Specific Question:   Is the patient currently using CPAP in the home    Answer:   Yes    Order Specific Question:   Settings    Answer:   Other see comments    Order Specific Question:   CPAP supplies needed    Answer:   Mask, headgear, cushions, filters, heated tubing and water chamber     No orders of the defined types were placed in this encounter.     I spent 15 minutes with the patient. 50% of this time was spent counseling and educating patient on plan of care and medications.    Debbora Presto, FNP-C 09/24/2019, 8:03 AM [facility] [address] [place], [place] [address] [phone]

## 2019-09-23 NOTE — Patient Instructions (Signed)
Please continue using your CPAP regularly. While your insurance requires that you use CPAP at least 4 hours each night on 70% of the nights, I recommend, that you not skip any nights and use it throughout the night if you can. Getting used to CPAP and staying with the treatment long term does take time and patience and discipline. Untreated obstructive sleep apnea when it is moderate to severe can have an adverse impact on cardiovascular health and raise her risk for heart disease, arrhythmias, hypertension, congestive heart failure, stroke and diabetes. Untreated obstructive sleep apnea causes sleep disruption, nonrestorative sleep, and sleep deprivation. This can have an impact on your day to day functioning and cause daytime sleepiness and impairment of cognitive function, memory loss, mood disturbance, and problems focussing. Using CPAP regularly can improve these symptoms.   Follow up in 1 year   Sleep Apnea Sleep apnea affects breathing during sleep. It causes breathing to stop for a short time or to become shallow. It can also increase the risk of:  Heart attack.  Stroke.  Being very overweight (obese).  Diabetes.  Heart failure.  Irregular heartbeat. The goal of treatment is to help you breathe normally again. What are the causes? There are three kinds of sleep apnea:  Obstructive sleep apnea. This is caused by a blocked or collapsed airway.  Central sleep apnea. This happens when the brain does not send the right signals to the muscles that control breathing.  Mixed sleep apnea. This is a combination of obstructive and central sleep apnea. The most common cause of this condition is a collapsed or blocked airway. This can happen if:  Your throat muscles are too relaxed.  Your tongue and tonsils are too large.  You are overweight.  Your airway is too small. What increases the risk?  Being overweight.  Smoking.  Having a small airway.  Being older.  Being  male.  Drinking alcohol.  Taking medicines to calm yourself (sedatives or tranquilizers).  Having family members with the condition. What are the signs or symptoms?  Trouble staying asleep.  Being sleepy or tired during the day.  Getting angry a lot.  Loud snoring.  Headaches in the morning.  Not being able to focus your mind (concentrate).  Forgetting things.  Less interest in sex.  Mood swings.  Personality changes.  Feelings of sadness (depression).  Waking up a lot during the night to pee (urinate).  Dry mouth.  Sore throat. How is this diagnosed?  Your medical history.  A physical exam.  A test that is done when you are sleeping (sleep study). The test is most often done in a sleep lab but may also be done at home. How is this treated?   Sleeping on your side.  Using a medicine to get rid of mucus in your nose (decongestant).  Avoiding the use of alcohol, medicines to help you relax, or certain pain medicines (narcotics).  Losing weight, if needed.  Changing your diet.  Not smoking.  Using a machine to open your airway while you sleep, such as: ? An oral appliance. This is a mouthpiece that shifts your lower jaw forward. ? A CPAP device. This device blows air through a mask when you breathe out (exhale). ? An EPAP device. This has valves that you put in each nostril. ? A BPAP device. This device blows air through a mask when you breathe in (inhale) and breathe out.  Having surgery if other treatments do not work. It is   important to get treatment for sleep apnea. Without treatment, it can lead to:  High blood pressure.  Coronary artery disease.  In men, not being able to have an erection (impotence).  Reduced thinking ability. Follow these instructions at home: Lifestyle  Make changes that your doctor recommends.  Eat a healthy diet.  Lose weight if needed.  Avoid alcohol, medicines to help you relax, and some pain  medicines.  Do not use any products that contain nicotine or tobacco, such as cigarettes, e-cigarettes, and chewing tobacco. If you need help quitting, ask your doctor. General instructions  Take over-the-counter and prescription medicines only as told by your doctor.  If you were given a machine to use while you sleep, use it only as told by your doctor.  If you are having surgery, make sure to tell your doctor you have sleep apnea. You may need to bring your device with you.  Keep all follow-up visits as told by your doctor. This is important. Contact a doctor if:  The machine that you were given to use during sleep bothers you or does not seem to be working.  You do not get better.  You get worse. Get help right away if:  Your chest hurts.  You have trouble breathing in enough air.  You have an uncomfortable feeling in your back, arms, or stomach.  You have trouble talking.  One side of your body feels weak.  A part of your face is hanging down. These symptoms may be an emergency. Do not wait to see if the symptoms will go away. Get medical help right away. Call your local emergency services (911 in the [place] Do not drive yourself to the hospital. Summary  This condition affects breathing during sleep.  The most common cause is a collapsed or blocked airway.  The goal of treatment is to help you breathe normally while you sleep. This information is not intended to replace advice given to you by your health care provider. Make sure you discuss any questions you have with your health care provider. Document Revised: 02/21/2018 Document Reviewed: 12/31/2017 [employer] Patient Education  2020 [employer]  

## 2019-09-24 ENCOUNTER — Encounter: Payer: Self-pay | Admitting: Family Medicine

## 2019-09-28 ENCOUNTER — Other Ambulatory Visit: Payer: Self-pay | Admitting: Family Medicine

## 2019-09-28 DIAGNOSIS — G4733 Obstructive sleep apnea (adult) (pediatric): Secondary | ICD-10-CM

## 2019-09-28 NOTE — Progress Notes (Signed)
Community message sent to [place]

## 2019-10-01 NOTE — Progress Notes (Addendum)
Cardiology Office Note:    Date:  10/12/2019   ID:  Tommy [name], DOB [birth year], MRN [id number]  PCP:  Shon Baton, MD  Cardiologist:  Skeet Latch, MD  Electrophysiologist:  None   Referring MD: Shon Baton, MD   Chief Complaint: follow-up of diastolic CHF and hypertension  History of Present Illness:    Tommy [name] is a 74 y.o. male with a history of chronic diastolic CHF, hypertension, asthma, obstructive sleep apnea on CPAP, GERD, fatty liver disease, and morbid obesity who is followed by Dr. Oval Linsey and presents today for follow-up.   Patient was referred to Dr. Oval Linsey in 09/2015 by PCP after chest x-ray showed a tortuous aorta and calcification of the thoracic aorta. He also reported dyspnea on exertion at the time. Dr. Oval Linsey ordered Echo and [facility] for further evaluation. Myoview was suggestive of possible prior infarct but no evidence of ischemia. Echo showed LVEF of 55-60% with normal wall motion. Diastolic function was abnormal but the degree of dysfunction was not commented. Given normal Echo, defect on Myoview was felt to likely be artifact. Patient was last seen by Dr. Oval Linsey for a virtual visit in 09/2018 at which time he still noted some exertional dyspnea but attributed this to not being very active. No medications changes were made and he was advised to follow-up in 1 year.   Patient presents today for follow-up. He states he has not been as active since COVID and notes his diet has also not been as good. No significant weight gain since last visit. He states his weight stays between 358 lbs to 362 lbs. He reports chronic shortness of breath with exertion that he thinks may be a little worse than usual. Breathing worse when he has back/joint pain. His back and legs have been bothering him more lately. It sounds like he has sciatica with shoot pain from his back to anterior thighs. Still able to due strenuous activity such as moving 100 bails  of pine needles but is having to take more breaks. PCP recently restarted Gabapentin and that has significantly helped his leg pain but he has not tried increasing his activity yet. No orthopnea. He states he occasionally wake up feeling short of breath but states he thinks this is due to nasal congestion. He is compliant with his CPAP. Stable chronic lower extremity edema. No chest pain. No palpitations. He notes some dizziness with position changes but states it does not last for long. No falls or syncope.    Past Medical History:  Diagnosis Date  . Allergy   . Anemia    "as teenager"  . Arthritis    hands, hip  . Back pain   . BPPV (benign paroxysmal positional vertigo)    History of  . Cancer ([place])    "melanoma removed from leg years ago", malignant mole  . Chronic back pain   . Complication of anesthesia    woke up and was confused and combative after back surgery  . Constipation   . DDD (degenerative disc disease), lumbar   . Diabetes mellitus without complication ([place])   . Diabetic neuropathy ([place])   . ED (erectile dysfunction)   . Essential hypertension 10/16/2015  . Fatty liver   . First degree AV block    EKG 12/03/16  . GERD (gastroesophageal reflux disease)    diet controlled, no meds currently, history of  . H/O hiatal hernia   . History of elevated PSA   . Hypertension  sees Dr. Virgina Jock  . Nocturia more than twice per night 12/21/2013  . OA (osteoarthritis)   . Obesity hypoventilation syndrome ([place]) 04/07/2014  . Obesity, morbid ([place]) 12/21/2013  . OSA on CPAP 04/07/2014  . RBBB    previous EKG  . Sleep apnea    uses CPAP nightly  . Snoring 12/21/2013  . Spondylosis 2014   lumbar back, noted on MR   . Ulcer    stomach ulcer "as teenager"    Past Surgical History:  Procedure Laterality Date  . BACK SURGERY     "done approx. 24 years ago"  . CARDIOVASCULAR STRESS TEST     "done approx 10 years ago, Dr. Virgina Jock"  . COLONOSCOPY  2006   hx of "with removal of  polyps"/[facility]  . COLONOSCOPY WITH PROPOFOL N/A 09/06/2015   Procedure: COLONOSCOPY WITH PROPOFOL;  Surgeon: Ladene Artist, MD;  Location: [facility];  Service: Endoscopy;  Laterality: N/A;  . lipoma removed     from leg  . LUMBAR LAMINECTOMY/DECOMPRESSION MICRODISCECTOMY  06/19/2012   Procedure: LUMBAR LAMINECTOMY/DECOMPRESSION MICRODISCECTOMY 1 LEVEL;  Surgeon: Melina Schools, MD;  Location: [place];  Service: Orthopedics;  Laterality: Left;  L2-3 LEFT MICRODISCECTOMY  . thigh surgery     fatty deposit  . TONSILLECTOMY    . TOTAL HIP ARTHROPLASTY Right 06/05/2017   Procedure: RIGHT TOTAL HIP ARTHROPLASTY ANTERIOR APPROACH;  Surgeon: Gaynelle Arabian, MD;  Location: [facility];  Service: Orthopedics;  Laterality: Right;  Dr. requested 120 minutes  . WISDOM TOOTH EXTRACTION      Current Medications: Current Meds  Medication Sig  . amLODipine (NORVASC) 10 MG tablet Take 10 mg by mouth every morning.   Marland Kitchen aspirin EC 81 MG tablet Take 81 mg by mouth daily.  Marland Kitchen doxazosin (CARDURA) 2 MG tablet TAKE 1 TABLET BY MOUTH 2 TIMES DAILY. TO BE TAKEN WITH THE 4 MG TABLETS  . doxazosin (CARDURA) 4 MG tablet TAKE 1 TABLET BY MOUTH 2 TIMES DAILY.  Marland Kitchen FIBER PO Take 3 tablets by mouth every morning.   . gabapentin (NEURONTIN) 100 MG capsule gabapentin 100 mg capsule  . Glucosamine-Chondroit-Vit C-Mn (GLUCOSAMINE 1500 COMPLEX PO) Take by mouth.  . INVOKANA 100 MG TABS tablet   . ipratropium (ATROVENT) 0.03 % nasal spray Place 1 spray into both nostrils at bedtime as needed for rhinitis.   . metFORMIN (GLUCOPHAGE) 500 MG tablet Take 500 mg by mouth daily with breakfast.   . methocarbamol (ROBAXIN) 500 MG tablet Take 1 tablet (500 mg total) by mouth every 6 (six) hours as needed for muscle spasms.  . mometasone (NASONEX) 50 MCG/ACT nasal spray Place 2 sprays into the nose daily.  . montelukast (SINGULAIR) 10 MG tablet Take 10 mg by mouth every morning.  . Multiple Vitamin (MULTIVITAMIN) capsule Take 1 capsule by mouth  daily.  . naproxen (NAPROSYN) 500 MG tablet Take 500 mg by mouth as needed.  . Nebivolol HCl (BYSTOLIC) 20 MG TABS Take 1 tablet (20 mg total) by mouth daily.  Marland Kitchen oxyCODONE-acetaminophen (PERCOCET/ROXICET) 5-325 MG tablet oxycodone-acetaminophen 5 mg-325 mg tablet  . rosuvastatin (CRESTOR) 5 MG tablet rosuvastatin 5 mg tablet  . valsartan-hydrochlorothiazide (DIOVAN-HCT) 320-25 MG per tablet Take 1 tablet by mouth every morning.  . [DISCONTINUED] BYSTOLIC 20 MG TABS Take 1 tablet by mouth once daily     Allergies:   Micardis [telmisartan]   Social History   Socioeconomic History  . Marital status: Married    Spouse name: [name]  . Number of  children: 2  . Years of education: Masters  . Highest education level: Not on file  Occupational History    Employer: RETIRED  Tobacco Use  . Smoking status: Never Smoker  . Smokeless tobacco: Never Used  Substance and Sexual Activity  . Alcohol use: No    Alcohol/week: 0.0 standard drinks  . Drug use: No  . Sexual activity: Not on file  Other Topics Concern  . Not on file  Social History Narrative   Patient is married [name]) and lives at home with his wife.   Patient has two adult children.   Patient is retired.   Patient has a Master's degree.   Patient is right-handed   Patient does not drink any caffeine.   Social Determinants of Health   Financial Resource Strain:   . Difficulty of Paying Living Expenses:   Food Insecurity:   . Worried About Charity fundraiser in the Last Year:   . Arboriculturist in the Last Year:   Transportation Needs:   . Film/video editor (Medical):   Marland Kitchen Lack of Transportation (Non-Medical):   Physical Activity:   . Days of Exercise per Week:   . Minutes of Exercise per Session:   Stress:   . Feeling of Stress :   Social Connections:   . Frequency of Communication with Friends and Family:   . Frequency of Social Gatherings with Friends and Family:   . Attends Religious Services:   .  Active Member of Clubs or Organizations:   . Attends Archivist Meetings:   Marland Kitchen Marital Status:      Family History: The patient's family history includes Breast cancer in his sister; Colon polyps in his mother; Dementia in his mother; Heart attack in his sister; Heart failure in his father; Thyroid disease in his sister. There is no history of Colon cancer, Esophageal cancer, Rectal cancer, or Stomach cancer.  ROS:   Please see the history of present illness.    All other systems reviewed and are negative.  EKGs/Labs/Other Studies Reviewed:    The following studies were reviewed today:  Lexiscan Myoview 11/02/2015:  Nuclear stress EF: 44%.  There was no ST segment deviation noted during stress.  No T wave inversion was noted during stress.  Defect 1: There is a small defect of moderate severity present in the apex location. Apical thinning vs prior infarct.  This is an intermediate risk study due to reduced LVEF.  The left ventricular ejection fraction is moderately decreased (30-44%).  No ischemia. _______________  Echocardiogram 11/03/2015: Study Conclusions: - Procedure narrative: Transthoracic echocardiography. Image  quality was suboptimal. The study was technically difficult.  Intravenous contrast (Definity) was administered.  - Left ventricle: The cavity size was mildly dilated. Wall  thickness was increased in a pattern of mild LVH. Systolic  function was normal. The estimated ejection fraction was in the  range of 55% to 60%. Wall motion was normal; there were no  regional wall motion abnormalities. Acoustic contrast  opacification revealed no evidence ofthrombus.  - Left atrium: The atrium was mildly dilated.  - Right ventricle: The cavity size was mildly dilated.  EKG:  EKG ordered today. EKG personally reviewed and demonstrates normal sinus rhythm, rate 64 bpm, with 1st degree AV block, RBBB, and LAFB. No acute ST/T changes. Left axis  deviation.   Recent Labs: No results found for requested labs within last 8760 hours.  Recent Lipid Panel No results found for: CHOL, TRIG, HDL,  CHOLHDL, VLDL, LDLCALC, LDLDIRECT  Physical Exam:    Vital Signs: BP 122/72 (BP Location: Left Arm, Patient Position: Sitting, Cuff Size: Large)   Pulse 63   Temp 97.6 F (36.4 C)   Wt (!) 366 lb 6.4 oz (166.2 kg)   SpO2 93%   BMI 47.69 kg/m     Wt Readings from Last 3 Encounters:  10/12/19 (!) 366 lb 6.4 oz (166.2 kg)  09/23/19 (!) 362 lb 3.2 oz (164.3 kg)  09/30/18 (!) 361 lb (163.7 kg)     General: 74 y.o. obese Caucasian male in no acute distress. HEENT: Normocephalic and atraumatic. Sclera clear.  Neck: Supple. No carotid bruits. No JVD. Heart: RRR. Distinct S1 and S2. No murmurs, gallops, or rubs. Radial pulses 2+ and equal bilaterally. Lungs: No increased work of breathing. Clear to ausculation bilaterally. No wheezes, rhonchi, or rales.  Abdomen: Soft, non-distended, and non-tender to palpation. Bowel sounds present. MSK: Normal strength and tone for age.  Extremities: Trace bilateral lower extremity edema.   Skin: Warm and dry. Neuro: Alert and oriented x3. No focal deficits. Psych: Normal affect. Responds appropriately.   Assessment:    1. Chronic diastolic CHF (congestive heart failure) ([place])   2. Essential hypertension   3. Obstructive sleep apnea   4. Morbid obesity ([place])     Plan:    Chronic Diastolic CHF - Echo in [id number] showed LVEF of 55-60%. Diastolic function was not specifically commented on but felt likely have diastolic heart failure.  - Patient has chronic dyspnea on exertion. Has been a little worse lately with his leg pain but overall sounds pretty stable. Weight stable. Patient does not appear significantly volume overloaded on exam. Suspect this is due to body habitus and inactivity/deconditioning. Patient agrees with this. Will hold off on any additional testing for now. Encouraged patient to be  more active. He will let us know if shortness of breath worsens. Patient to let us know if he has 3lb weight gain in 1 day or 5lb weight gain in 1 week.  Hypertension - BP well controlled.  - Continue current medications: Amlodipine 10mg  daily, Valsartan-HCTZ 99991111 daily, Bystolic 20mg  daily, and Doxazosin 6mg  twice daily. - Continue to monitor BP a home.  Obstructive Sleep Apnea - Compliant with CPAP.   Morbid Obesity  - BMI 47. - Suspect this is the main cause of his dyspnea on exertion. - Discussed importance of increasing activity and heart healthy diet. Recommended pool exercise and stationary bike which can be easier on the joints.  Disposition: Follow up in 6 months with Dr. Oval Linsey.  ADDENDUM 10/16/2019 1:35PM: At the end of the visit, patient also complained of tinnitus and a dry mouth for several months and was wondering if these could be due to his medications. I told him that I would take a more thorough look at his medications and let him know. He is on medications that can cause these symptoms although they are rare side effects. Amlodipine and Nebivolol can cause tinnitus (as well as Aspirin at high doses). Doxazosin can cause a dry mouth. He is also on Valsartan-HCTZ so diuretic could be contributing to dry mouth but he did not look hypovolemic on exam. BP is well controlled on current medications; however, I sent patient a MyChart message today and said we could try to adjust his medications if symptoms are very bothersome to him to see if that helps.    Medication Adjustments/Labs and Tests Ordered: Current medicines are reviewed at length  with the patient today.  Concerns regarding medicines are outlined above.  Orders Placed This Encounter  Procedures  . EKG 12-Lead   Meds ordered this encounter  Medications  . Nebivolol HCl (BYSTOLIC) 20 MG TABS    Sig: Take 1 tablet (20 mg total) by mouth daily.    Dispense:  90 tablet    Refill:  1    Patient Instructions   Medication Instructions:  The current medical regimen is effective;  continue present plan and medications as directed. Please refer to the Current Medication list given to you today. *If you need a refill on your cardiac medications before your next appointment, please call your pharmacy*  Special Instructions PLEASE WEIGH YOURSELF DAILY-IF YOU WEIGHT INCREASES >3 lb OVERNIGHT OR >5lb IN A WEEK PLEASE CALL OUR OFFICE TO DISCUSS  PLEASE CALL OUR OFFICE SHOULD YOU HAVE ANY BREATHING ISSUES  PLEASE INCREASE PHYSICAL ACTIVITY AS TOLERATED  PLEASE FOLLOW HEART HEALTHY DIET ATTACHED  PLEASE READ AND FOLLOW SALTY 6-ATTACHED  Follow-Up: Your next appointment:  6 month(s) Please call our office 2 months in advance to schedule this appointment In Person with Skeet Latch, MD  At [facility], you and your health needs are our priority.  As part of our continuing mission to provide you with exceptional heart care, we have created designated Provider Care Teams.  These Care Teams include your primary Cardiologist (physician) and Advanced Practice Providers (APPs -  Physician Assistants and Nurse Practitioners) who all work together to provide you with the care       HEART HEALTHY DIET   What are tips for following this plan? Meal planning  At meals, divide your plate into four equal parts: ? Fill one-half of your plate with vegetables and green salads. ? Fill one-fourth of your plate with whole grains. ? Fill one-fourth of your plate with low-fat (lean) protein foods.  Eat fish that is high in omega-3 fats at least two times a week. This includes mackerel, tuna, sardines, and salmon.  Eat foods that are high in fiber, such as whole grains, beans, apples, broccoli, carrots, peas, and barley. General tips   Work with your doctor to lose weight if you need to.  Avoid: ? Foods with added sugar. ? Fried foods. ? Foods with partially hydrogenated oils.  Limit alcohol intake to  no more than 1 drink a day for nonpregnant women and 2 drinks a day for men. One drink equals 12 oz of beer, 5 oz of wine, or 1 oz of hard liquor. Reading food labels  Check food labels for: ? Trans fats. ? Partially hydrogenated oils. ? Saturated fat (g) in each serving. ? Cholesterol (mg) in each serving. ? Fiber (g) in each serving.  Choose foods with healthy fats, such as: ? Monounsaturated fats. ? Polyunsaturated fats. ? Omega-3 fats.  Choose grain products that have whole grains. Look for the word "whole" as the first word in the ingredient list. Cooking  Cook foods using low-fat methods. These include baking, boiling, grilling, and broiling.  Eat more home-cooked foods. Eat at restaurants and buffets less often.  Avoid cooking using saturated fats, such as butter, cream, palm oil, palm kernel oil, and coconut oil. Recommended foods  Fruits  All fresh, canned (in natural juice), or frozen fruits. Vegetables  Fresh or frozen vegetables (raw, steamed, roasted, or grilled). Green salads. Grains  Whole grains, such as whole wheat or whole grain breads, crackers, cereals, and pasta. Unsweetened oatmeal, bulgur, barley, quinoa, or  brown rice. Corn or whole wheat flour tortillas. Meats and other protein foods  Ground beef (85% or leaner), grass-fed beef, or beef trimmed of fat. Skinless chicken or [place]. Ground chicken or [place]. Pork trimmed of fat. All fish and seafood. Egg whites. Dried beans, peas, or lentils. Unsalted nuts or seeds. Unsalted canned beans. Nut butters without added sugar or oil. Dairy  Low-fat or nonfat dairy products, such as skim or 1% milk, 2% or reduced-fat cheeses, low-fat and fat-free ricotta or cottage cheese, or plain low-fat and nonfat yogurt. Fats and oils  Tub margarine without trans fats. Light or reduced-fat mayonnaise and salad dressings. Avocado. Olive, canola, sesame, or safflower oils. The items listed above may not be a complete list  of foods and beverages you can eat. Contact a dietitian for more information. Foods to avoid Fruits  Canned fruit in heavy syrup. Fruit in cream or butter sauce. Fried fruit. Vegetables  Vegetables cooked in cheese, cream, or butter sauce. Fried vegetables. Grains  White bread. White pasta. White rice. Cornbread. Bagels, pastries, and croissants. Crackers and snack foods that contain trans fat and hydrogenated oils. Meats and other protein foods  Fatty cuts of meat. Ribs, chicken wings, bacon, sausage, bologna, salami, chitterlings, fatback, hot dogs, bratwurst, and packaged lunch meats. Liver and organ meats. Whole eggs and egg yolks. Chicken and [place] with skin. Fried meat. Dairy  Whole or 2% milk, cream, half-and-half, and cream cheese. Whole milk cheeses. Whole-fat or sweetened yogurt. Full-fat cheeses. Nondairy creamers and whipped toppings. Processed cheese, cheese spreads, and cheese curds. Beverages  Alcohol. Sugar-sweetened drinks such as sodas, lemonade, and fruit drinks. Fats and oils  Butter, stick margarine, lard, shortening, ghee, or bacon fat. Coconut, palm kernel, and palm oils. Sweets and desserts  Corn syrup, sugars, honey, and molasses. Candy. Jam and jelly. Syrup. Sweetened cereals. Cookies, pies, cakes, donuts, muffins, and ice cream. The items listed above may not be a complete list of foods and beverages you should avoid. Contact a dietitian for more information. Summary  Choosing the right foods helps keep your fat and cholesterol at normal levels. This can keep you from getting certain diseases.  At meals, fill one-half of your plate with vegetables and green salads.  Eat high-fiber foods, like whole grains, beans, apples, carrots, peas, and barley.  Limit added sugar, saturated fats, alcohol, and fried foods. This information is not intended to replace advice given to you by your health care provider. Make sure you discuss any questions you have with  your health care provider.        Signed, Darreld Mclean, PA-C  10/12/2019 6:36 PM    [facility]

## 2019-10-12 ENCOUNTER — Ambulatory Visit: Payer: Medicare PPO | Admitting: Student

## 2019-10-12 ENCOUNTER — Encounter: Payer: Self-pay | Admitting: Student

## 2019-10-12 ENCOUNTER — Other Ambulatory Visit: Payer: Self-pay

## 2019-10-12 VITALS — BP 122/72 | HR 63 | Temp 97.6°F | Wt 366.4 lb

## 2019-10-12 DIAGNOSIS — I5032 Chronic diastolic (congestive) heart failure: Secondary | ICD-10-CM

## 2019-10-12 DIAGNOSIS — G4733 Obstructive sleep apnea (adult) (pediatric): Secondary | ICD-10-CM

## 2019-10-12 DIAGNOSIS — I10 Essential (primary) hypertension: Secondary | ICD-10-CM | POA: Diagnosis not present

## 2019-10-12 MED ORDER — BYSTOLIC 20 MG PO TABS: 1.0000 | ORAL_TABLET | Freq: Every day | ORAL | 1 refills | Status: DC

## 2019-10-12 NOTE — Patient Instructions (Signed)
Medication Instructions:  The current medical regimen is effective;  continue present plan and medications as directed. Please refer to the Current Medication list given to you today. *If you need a refill on your cardiac medications before your next appointment, please call your pharmacy*  Special Instructions PLEASE WEIGH YOURSELF DAILY-IF YOU WEIGHT INCREASES >3 lb OVERNIGHT OR >5lb IN A WEEK PLEASE CALL OUR OFFICE TO DISCUSS  PLEASE CALL OUR OFFICE SHOULD YOU HAVE ANY BREATHING ISSUES  PLEASE INCREASE PHYSICAL ACTIVITY AS TOLERATED  PLEASE FOLLOW HEART HEALTHY DIET ATTACHED  PLEASE READ AND FOLLOW SALTY 6-ATTACHED  Follow-Up: Your next appointment:  6 month(s) Please call our office 2 months in advance to schedule this appointment In Person with Skeet Latch, MD  At [facility], you and your health needs are our priority.  As part of our continuing mission to provide you with exceptional heart care, we have created designated Provider Care Teams.  These Care Teams include your primary Cardiologist (physician) and Advanced Practice Providers (APPs -  Physician Assistants and Nurse Practitioners) who all work together to provide you with the care       HEART HEALTHY DIET   What are tips for following this plan? Meal planning  At meals, divide your plate into four equal parts: ? Fill one-half of your plate with vegetables and green salads. ? Fill one-fourth of your plate with whole grains. ? Fill one-fourth of your plate with low-fat (lean) protein foods.  Eat fish that is high in omega-3 fats at least two times a week. This includes mackerel, tuna, sardines, and salmon.  Eat foods that are high in fiber, such as whole grains, beans, apples, broccoli, carrots, peas, and barley. General tips   Work with your doctor to lose weight if you need to.  Avoid: ? Foods with added sugar. ? Fried foods. ? Foods with partially hydrogenated oils.  Limit alcohol intake to no  more than 1 drink a day for nonpregnant women and 2 drinks a day for men. One drink equals 12 oz of beer, 5 oz of wine, or 1 oz of hard liquor. Reading food labels  Check food labels for: ? Trans fats. ? Partially hydrogenated oils. ? Saturated fat (g) in each serving. ? Cholesterol (mg) in each serving. ? Fiber (g) in each serving.  Choose foods with healthy fats, such as: ? Monounsaturated fats. ? Polyunsaturated fats. ? Omega-3 fats.  Choose grain products that have whole grains. Look for the word "whole" as the first word in the ingredient list. Cooking  Cook foods using low-fat methods. These include baking, boiling, grilling, and broiling.  Eat more home-cooked foods. Eat at restaurants and buffets less often.  Avoid cooking using saturated fats, such as butter, cream, palm oil, palm kernel oil, and coconut oil. Recommended foods  Fruits  All fresh, canned (in natural juice), or frozen fruits. Vegetables  Fresh or frozen vegetables (raw, steamed, roasted, or grilled). Green salads. Grains  Whole grains, such as whole wheat or whole grain breads, crackers, cereals, and pasta. Unsweetened oatmeal, bulgur, barley, quinoa, or brown rice. Corn or whole wheat flour tortillas. Meats and other protein foods  Ground beef (85% or leaner), grass-fed beef, or beef trimmed of fat. Skinless chicken or [place]. Ground chicken or [place]. Pork trimmed of fat. All fish and seafood. Egg whites. Dried beans, peas, or lentils. Unsalted nuts or seeds. Unsalted canned beans. Nut butters without added sugar or oil. Dairy  Low-fat or nonfat dairy products, such  as skim or 1% milk, 2% or reduced-fat cheeses, low-fat and fat-free ricotta or cottage cheese, or plain low-fat and nonfat yogurt. Fats and oils  Tub margarine without trans fats. Light or reduced-fat mayonnaise and salad dressings. Avocado. Olive, canola, sesame, or safflower oils. The items listed above may not be a complete list of  foods and beverages you can eat. Contact a dietitian for more information. Foods to avoid Fruits  Canned fruit in heavy syrup. Fruit in cream or butter sauce. Fried fruit. Vegetables  Vegetables cooked in cheese, cream, or butter sauce. Fried vegetables. Grains  White bread. White pasta. White rice. Cornbread. Bagels, pastries, and croissants. Crackers and snack foods that contain trans fat and hydrogenated oils. Meats and other protein foods  Fatty cuts of meat. Ribs, chicken wings, bacon, sausage, bologna, salami, chitterlings, fatback, hot dogs, bratwurst, and packaged lunch meats. Liver and organ meats. Whole eggs and egg yolks. Chicken and [place] with skin. Fried meat. Dairy  Whole or 2% milk, cream, half-and-half, and cream cheese. Whole milk cheeses. Whole-fat or sweetened yogurt. Full-fat cheeses. Nondairy creamers and whipped toppings. Processed cheese, cheese spreads, and cheese curds. Beverages  Alcohol. Sugar-sweetened drinks such as sodas, lemonade, and fruit drinks. Fats and oils  Butter, stick margarine, lard, shortening, ghee, or bacon fat. Coconut, palm kernel, and palm oils. Sweets and desserts  Corn syrup, sugars, honey, and molasses. Candy. Jam and jelly. Syrup. Sweetened cereals. Cookies, pies, cakes, donuts, muffins, and ice cream. The items listed above may not be a complete list of foods and beverages you should avoid. Contact a dietitian for more information. Summary  Choosing the right foods helps keep your fat and cholesterol at normal levels. This can keep you from getting certain diseases.  At meals, fill one-half of your plate with vegetables and green salads.  Eat high-fiber foods, like whole grains, beans, apples, carrots, peas, and barley.  Limit added sugar, saturated fats, alcohol, and fried foods. This information is not intended to replace advice given to you by your health care provider. Make sure you discuss any questions you have with your  health care provider.

## 2019-10-22 ENCOUNTER — Other Ambulatory Visit: Payer: Self-pay | Admitting: Cardiovascular Disease

## 2019-11-02 ENCOUNTER — Other Ambulatory Visit: Payer: Self-pay | Admitting: Cardiovascular Disease

## 2019-11-04 ENCOUNTER — Telehealth: Payer: Self-pay | Admitting: Student

## 2019-11-04 ENCOUNTER — Other Ambulatory Visit: Payer: Self-pay | Admitting: Cardiovascular Disease

## 2019-11-04 NOTE — Telephone Encounter (Signed)
Error    Pt recieved prescription

## 2019-11-13 DIAGNOSIS — G4733 Obstructive sleep apnea (adult) (pediatric): Secondary | ICD-10-CM | POA: Diagnosis not present

## 2019-11-13 DIAGNOSIS — J45998 Other asthma: Secondary | ICD-10-CM | POA: Diagnosis not present

## 2019-11-30 ENCOUNTER — Other Ambulatory Visit: Payer: Self-pay | Admitting: Cardiovascular Disease

## 2020-01-19 DIAGNOSIS — Z125 Encounter for screening for malignant neoplasm of prostate: Secondary | ICD-10-CM | POA: Diagnosis not present

## 2020-01-19 DIAGNOSIS — I10 Essential (primary) hypertension: Secondary | ICD-10-CM | POA: Diagnosis not present

## 2020-01-19 DIAGNOSIS — E786 Lipoprotein deficiency: Secondary | ICD-10-CM | POA: Diagnosis not present

## 2020-01-19 DIAGNOSIS — E1140 Type 2 diabetes mellitus with diabetic neuropathy, unspecified: Secondary | ICD-10-CM | POA: Diagnosis not present

## 2020-01-26 DIAGNOSIS — Z1212 Encounter for screening for malignant neoplasm of rectum: Secondary | ICD-10-CM | POA: Diagnosis not present

## 2020-01-26 DIAGNOSIS — I77810 Thoracic aortic ectasia: Secondary | ICD-10-CM | POA: Diagnosis not present

## 2020-01-26 DIAGNOSIS — R82998 Other abnormal findings in urine: Secondary | ICD-10-CM | POA: Diagnosis not present

## 2020-01-26 DIAGNOSIS — Z0000 Encounter for general adult medical examination without abnormal findings: Secondary | ICD-10-CM | POA: Diagnosis not present

## 2020-01-26 DIAGNOSIS — E786 Lipoprotein deficiency: Secondary | ICD-10-CM | POA: Diagnosis not present

## 2020-01-26 DIAGNOSIS — K760 Fatty (change of) liver, not elsewhere classified: Secondary | ICD-10-CM | POA: Diagnosis not present

## 2020-01-26 DIAGNOSIS — I10 Essential (primary) hypertension: Secondary | ICD-10-CM | POA: Diagnosis not present

## 2020-01-26 DIAGNOSIS — G629 Polyneuropathy, unspecified: Secondary | ICD-10-CM | POA: Diagnosis not present

## 2020-01-26 DIAGNOSIS — I5189 Other ill-defined heart diseases: Secondary | ICD-10-CM | POA: Diagnosis not present

## 2020-01-26 DIAGNOSIS — E662 Morbid (severe) obesity with alveolar hypoventilation: Secondary | ICD-10-CM | POA: Diagnosis not present

## 2020-01-26 DIAGNOSIS — E1140 Type 2 diabetes mellitus with diabetic neuropathy, unspecified: Secondary | ICD-10-CM | POA: Diagnosis not present

## 2020-02-24 ENCOUNTER — Telehealth: Payer: Self-pay | Admitting: Family Medicine

## 2020-02-24 DIAGNOSIS — Z9989 Dependence on other enabling machines and devices: Secondary | ICD-10-CM

## 2020-02-24 DIAGNOSIS — J45998 Other asthma: Secondary | ICD-10-CM | POA: Diagnosis not present

## 2020-02-24 DIAGNOSIS — G4733 Obstructive sleep apnea (adult) (pediatric): Secondary | ICD-10-CM | POA: Diagnosis not present

## 2020-02-24 NOTE — Telephone Encounter (Signed)
Orders were sent at last follow up on 09/2019. Should be current? Maybe we can refax order?

## 2020-02-24 NOTE — Telephone Encounter (Signed)
Pt has called to inform Tommy [name] informed him the script for his CPAP has expired, pt needs a new one.  Please call.

## 2020-02-25 NOTE — Addendum Note (Signed)
Addended byUbaldo Glassing, Parul Porcelli L on: 02/25/2020 03:09 PM   Modules accepted: Orders

## 2020-02-25 NOTE — Telephone Encounter (Signed)
Spoke to pt, states [facility] will need new rx, pt was unable to get CPAP because DME had CPAP's on back order. The script is good for 90 days only,

## 2020-02-25 NOTE — Telephone Encounter (Signed)
Order for cpap supplies sent to [facility] via [employer]. Confirmation received that the order transmitted was successful.

## 2020-03-14 DIAGNOSIS — H5203 Hypermetropia, bilateral: Secondary | ICD-10-CM | POA: Diagnosis not present

## 2020-03-14 DIAGNOSIS — E119 Type 2 diabetes mellitus without complications: Secondary | ICD-10-CM | POA: Diagnosis not present

## 2020-03-14 DIAGNOSIS — H524 Presbyopia: Secondary | ICD-10-CM | POA: Diagnosis not present

## 2020-04-12 ENCOUNTER — Ambulatory Visit: Payer: Medicare PPO | Admitting: Cardiovascular Disease

## 2020-04-12 ENCOUNTER — Other Ambulatory Visit: Payer: Self-pay

## 2020-04-12 ENCOUNTER — Encounter: Payer: Self-pay | Admitting: Cardiovascular Disease

## 2020-04-12 VITALS — BP 132/82 | HR 70 | Ht 72.0 in | Wt 364.4 lb

## 2020-04-12 DIAGNOSIS — I10 Essential (primary) hypertension: Secondary | ICD-10-CM | POA: Diagnosis not present

## 2020-04-12 DIAGNOSIS — G4733 Obstructive sleep apnea (adult) (pediatric): Secondary | ICD-10-CM | POA: Diagnosis not present

## 2020-04-12 DIAGNOSIS — Z9989 Dependence on other enabling machines and devices: Secondary | ICD-10-CM | POA: Diagnosis not present

## 2020-04-12 DIAGNOSIS — E662 Morbid (severe) obesity with alveolar hypoventilation: Secondary | ICD-10-CM

## 2020-04-12 NOTE — Progress Notes (Signed)
Cardiology Office Note   Date:  04/12/2020   ID:  Tommy [name], DOB 06-02-1945, MRN [id number]  PCP:  Shon Baton, MD  Cardiologist:  Skeet Latch, MD  Electrophysiologist:  None  Evaluation Performed:  Follow-Up Visit  Chief Complaint:  hypertension  History of Present Illness:    Tommy [name] is a 74 y.o. male with asthma, hypertension, GERD, fatty liver disease, OSA on CPAP and morbid obesity who presents for follow up on shortness of breath. Tommy [name] saw his PCP, Dr. Shon Baton, on 09/20/15. At that appointment it was noted that a chest x-ray in January 2014 showed a tortuous aorta and calcifications of the thoracic aorta. He also reported dyspnea on exertion. He was referred to cardiology for further evaluation.  He was referred for a Lexiscan Myoview 10/2015 that was negative for ischemia but the ejection fraction was reported as 30-44%. He subsequently had an echocardiogram that revealed normal systolic function and mild LVH.  Diastolic function was abnormal but the degree of dysfunction was not commented upon and the echo is not currently available for review.   Tommy [name]'s BP remained elevated so doxazosin was increased to 6mg  bid.  Since that time his blood pressure has been much better.  He brings a log of his blood pressure showing that in the mornings before he takes his medicine it ranges from 120s to 150s over 60s to 80s.  Throughout the day his blood pressure is typically less than 130/80.  Overall he is felt well.  His main limitation is joint pain.  He is able to do his yard work though he does get short of breath with exertion and sometimes has to stop.  Last year even had joint pain when he tried exercise in his pool.  He has chronic lower extremity edema that is unchanged from baseline.  He has no orthopnea or PND.  He uses a CPAP but needs a new machine.  Sometimes it does not always want to turn on.  He notes that he needs to work on his weight  but struggles with changing his mindset and eating differently.  Both he and his wife have the same struggle.  He has not been interested in bariatric surgery.   Past Medical History:  Diagnosis Date  . Allergy   . Anemia    "as teenager"  . Arthritis    hands, hip  . Back pain   . BPPV (benign paroxysmal positional vertigo)    History of  . Cancer ([place])    "melanoma removed from leg years ago", malignant mole  . Chronic back pain   . Complication of anesthesia    woke up and was confused and combative after back surgery  . Constipation   . DDD (degenerative disc disease), lumbar   . Diabetes mellitus without complication ([place])   . Diabetic neuropathy ([place])   . ED (erectile dysfunction)   . Essential hypertension 10/16/2015  . Fatty liver   . First degree AV block    EKG 12/03/16  . GERD (gastroesophageal reflux disease)    diet controlled, no meds currently, history of  . H/O hiatal hernia   . History of elevated PSA   . Hypertension    sees Dr. Virgina Jock  . Nocturia more than twice per night 12/21/2013  . OA (osteoarthritis)   . Obesity hypoventilation syndrome ([place]) 04/07/2014  . Obesity, morbid ([place]) 12/21/2013  . OSA on CPAP 04/07/2014  . RBBB    previous  EKG  . Sleep apnea    uses CPAP nightly  . Snoring 12/21/2013  . Spondylosis 2014   lumbar back, noted on MR   . Ulcer    stomach ulcer "as teenager"   Past Surgical History:  Procedure Laterality Date  . BACK SURGERY     "done approx. 24 years ago"  . CARDIOVASCULAR STRESS TEST     "done approx 10 years ago, Dr. Virgina Jock"  . COLONOSCOPY  2006   hx of "with removal of polyps"/[facility]  . COLONOSCOPY WITH PROPOFOL N/A 09/06/2015   Procedure: COLONOSCOPY WITH PROPOFOL;  Surgeon: Ladene Artist, MD;  Location: [facility];  Service: Endoscopy;  Laterality: N/A;  . lipoma removed     from leg  . LUMBAR LAMINECTOMY/DECOMPRESSION MICRODISCECTOMY  06/19/2012   Procedure: LUMBAR LAMINECTOMY/DECOMPRESSION MICRODISCECTOMY 1  LEVEL;  Surgeon: Melina Schools, MD;  Location: [place];  Service: Orthopedics;  Laterality: Left;  L2-3 LEFT MICRODISCECTOMY  . thigh surgery     fatty deposit  . TONSILLECTOMY    . TOTAL HIP ARTHROPLASTY Right 06/05/2017   Procedure: RIGHT TOTAL HIP ARTHROPLASTY ANTERIOR APPROACH;  Surgeon: Gaynelle Arabian, MD;  Location: [facility];  Service: Orthopedics;  Laterality: Right;  Dr. requested 120 minutes  . WISDOM TOOTH EXTRACTION       Current Meds  Medication Sig  . ACCU-CHEK AVIVA PLUS test strip   . Accu-Chek Softclix Lancets lancets   . amLODipine (NORVASC) 10 MG tablet Take 10 mg by mouth every morning.   Marland Kitchen aspirin EC 81 MG tablet Take 81 mg by mouth daily.  Marland Kitchen doxazosin (CARDURA) 2 MG tablet TAKE 1 TABLET BY MOUTH 2 TIMES DAILY. TO BE TAKEN WITH THE 4 MG TABLETS.  Marland Kitchen doxazosin (CARDURA) 4 MG tablet TAKE 1 TABLET BY MOUTH 2 TIMES DAILY.  Marland Kitchen FIBER PO Take 3 tablets by mouth every morning.   . gabapentin (NEURONTIN) 100 MG capsule gabapentin 100 mg capsule  . Glucosamine-Chondroit-Vit C-Mn (GLUCOSAMINE 1500 COMPLEX PO) Take by mouth.  . INVOKANA 100 MG TABS tablet   . ipratropium (ATROVENT) 0.03 % nasal spray Place 1 spray into both nostrils at bedtime as needed for rhinitis.   . metFORMIN (GLUCOPHAGE) 500 MG tablet Take 500 mg by mouth daily with breakfast.   . mometasone (NASONEX) 50 MCG/ACT nasal spray Place 2 sprays into the nose daily.  . montelukast (SINGULAIR) 10 MG tablet Take 10 mg by mouth every morning.  . Multiple Vitamin (MULTIVITAMIN) capsule Take 1 capsule by mouth daily.  . naproxen (NAPROSYN) 500 MG tablet Take 500 mg by mouth as needed.  . Nebivolol HCl (BYSTOLIC) 20 MG TABS Take 1 tablet (20 mg total) by mouth daily.  . rosuvastatin (CRESTOR) 5 MG tablet rosuvastatin 5 mg tablet  . valsartan-hydrochlorothiazide (DIOVAN-HCT) 320-25 MG per tablet Take 1 tablet by mouth every morning.     Allergies:   Micardis [telmisartan]   Social History   Tobacco Use  . Smoking  status: Never Smoker  . Smokeless tobacco: Never Used  Vaping Use  . Vaping Use: Never used  Substance Use Topics  . Alcohol use: No    Alcohol/week: 0.0 standard drinks  . Drug use: No     Family Hx: The patient's family history includes Breast cancer in his sister; Colon polyps in his mother; Dementia in his mother; Heart attack in his sister; Heart failure in his father; Thyroid disease in his sister. There is no history of Colon cancer, Esophageal cancer, Rectal cancer, or Stomach  cancer.  ROS:   Please see the history of present illness.    All other systems reviewed and are negative.   Prior CV studies:   The following studies were reviewed today:  Echo 11/03/15: Study Conclusions  - Procedure narrative: Transthoracic echocardiography. Image  quality was suboptimal. The study was technically difficult.  Intravenous contrast (Definity) was administered. - Left ventricle: The cavity size was mildly dilated. Wall  thickness was increased in a pattern of mild LVH. Systolic  function was normal. The estimated ejection fraction was in the  range of 55% to 60%. Wall motion was normal; there were no  regional wall motion abnormalities. Acoustic contrast  opacification revealed no evidence ofthrombus. - Left atrium: The atrium was mildly dilated. - Right ventricle: The cavity size was mildly dilated.   Lexiscan Myoview 11/03/15:  Nuclear stress EF: 44%.  There was no ST segment deviation noted during stress.  No T wave inversion was noted during stress.  Defect 1: There is a small defect of moderate severity present in the apex location. Apical thinning vs prior infarct.  This is an intermediate risk study due to reduced LVEF.  The left ventricular ejection fraction is moderately decreased (30-44%). No ischemia.Echo 11/03/15: Study Conclusions  - Procedure narrative: Transthoracic echocardiography. Image  quality was suboptimal. The study was technically  difficult.  Intravenous contrast (Definity) was administered. - Left ventricle: The cavity size was mildly dilated. Wall  thickness was increased in a pattern of mild LVH. Systolic  function was normal. The estimated ejection fraction was in the  range of 55% to 60%. Wall motion was normal; there were no  regional wall motion abnormalities. Acoustic contrast  opacification revealed no evidence ofthrombus. - Left atrium: The atrium was mildly dilated. - Right ventricle: The cavity size was mildly dilated.   Lexiscan Myoview 11/03/15:  Nuclear stress EF: 44%.  There was no ST segment deviation noted during stress.  No T wave inversion was noted during stress.  Defect 1: There is a small defect of moderate severity present in the apex location. Apical thinning vs prior infarct.  This is an intermediate risk study due to reduced LVEF.  The left ventricular ejection fraction is moderately decreased (30-44%).  No ischemia.  Labs/Other Tests and Data Reviewed:    EKG:  No ECG reviewed.  Recent Labs: No results found for requested labs within last 8760 hours.   Recent Lipid Panel No results found for: CHOL, TRIG, HDL, CHOLHDL, LDLCALC, LDLDIRECT  Wt Readings from Last 3 Encounters:  04/12/20 (!) 364 lb 6.4 oz (165.3 kg)  10/12/19 (!) 366 lb 6.4 oz (166.2 kg)  09/23/19 (!) 362 lb 3.2 oz (164.3 kg)     Objective:    VS:  BP 132/82   Pulse 70   Ht 6' (1.829 m)   Wt (!) 364 lb 6.4 oz (165.3 kg)   BMI 49.42 kg/m  , BMI Body mass index is 49.42 kg/m. GENERAL:  Well appearing HEENT: Pupils equal round and reactive, fundi not visualized, oral mucosa unremarkable NECK:  No jugular venous distention, waveform within normal limits, carotid upstroke brisk and symmetric, no bruits, no thyromegaly LYMPHATICS:  No cervical adenopathy LUNGS:  Clear to auscultation bilaterally HEART:  RRR.  PMI not displaced or sustained,S1 and S2 within normal limits, no S3, no S4, no clicks,  no rubs, no murmurs ABD:  Flat, positive bowel sounds normal in frequency in pitch, no bruits, no rebound, no guarding, no midline pulsatile mass, no  hepatomegaly, no splenomegaly EXT:  2 plus pulses throughout, 1+ LE edema, no cyanosis no clubbing SKIN: Chronic stasis dermatitis.  NEURO:  Cranial nerves II through XII grossly intact, motor grossly intact throughout PSYCH:  Cognitively intact, oriented to person place and time   ASSESSMENT & PLAN:    # Shortness of breath:  # Chronic diastolic heart failure: Normal systolic function on echo.   Chronic shortness of breath due to obesity and deconditioning.  His lower extremity edema is stable.  Encouraged him to try to work on increasing his exercise.  # Hypertension:  Blood pressure was elevated but better on repeat.  Overall it is well-controlled at home.  Continue current regimen of amlodipine, doxazosin, losartan, and HCTZ.  # CV Disease Prevention:   Lipids are followed by his PCP.  # Morbid obesity: Discussed the importance of getting back to the dietary changes that worked well for him in the past.  He is in the pre contemplative phase.  He previously was seen in weight loss clinic at [facility] and doesn't want a referral to the healthy weight wellness clinic.  He also declines bariatric interventions.   Medication Adjustments/Labs and Tests Ordered: Current medicines are reviewed at length with the patient today.  Concerns regarding medicines are outlined above.   Tests Ordered: No orders of the defined types were placed in this encounter.   Medication Changes: No orders of the defined types were placed in this encounter.   Disposition:  Follow up in 1 year(s)  Signed, Skeet Latch, MD  04/12/2020 9:45 AM    [place]

## 2020-04-12 NOTE — Patient Instructions (Signed)
Medication Instructions:  Your physician recommends that you continue on your current medications as directed. Please refer to the Current Medication list given to you today.   *If you need a refill on your cardiac medications before your next appointment, please call your pharmacy*  Lab Work: NONE   Testing/Procedures: NONE  Follow-Up: At [facility], you and your health needs are our priority.  As part of our continuing mission to provide you with exceptional heart care, we have created designated Provider Care Teams.  These Care Teams include your primary Cardiologist (physician) and Advanced Practice Providers (APPs -  Physician Assistants and Nurse Practitioners) who all work together to provide you with the care you need, when you need it.  We recommend signing up for the patient portal called "MyChart".  Sign up information is provided on this After Visit Summary.  MyChart is used to connect with patients for Virtual Visits (Telemedicine).  Patients are able to view lab/test results, encounter notes, upcoming appointments, etc.  Non-urgent messages can be sent to your provider as well.   To learn more about what you can do with MyChart, go to [email or web address].    Your next appointment:   12 month(s) You will receive a reminder letter in the mail two months in advance. If you don't receive a letter, please call our office to schedule the follow-up appointment.   The format for your next appointment:   In Person  Provider:   You may see Tiffany Antares, MD or one of the following Advanced Practice Providers on your designated Care Team:    Luke Kilroy, PA-C  Callie Goodrich, PA-C  Jesse Cleaver, FNP    

## 2020-04-19 ENCOUNTER — Other Ambulatory Visit: Payer: Self-pay | Admitting: Cardiovascular Disease

## 2020-04-29 DIAGNOSIS — M961 Postlaminectomy syndrome, not elsewhere classified: Secondary | ICD-10-CM | POA: Diagnosis not present

## 2020-04-29 DIAGNOSIS — E119 Type 2 diabetes mellitus without complications: Secondary | ICD-10-CM | POA: Diagnosis not present

## 2020-04-29 DIAGNOSIS — M5416 Radiculopathy, lumbar region: Secondary | ICD-10-CM | POA: Diagnosis not present

## 2020-04-30 ENCOUNTER — Other Ambulatory Visit: Payer: Self-pay | Admitting: Student

## 2020-05-02 NOTE — Telephone Encounter (Signed)
This is Dr. Farragut's pt.  °

## 2020-05-03 DIAGNOSIS — M961 Postlaminectomy syndrome, not elsewhere classified: Secondary | ICD-10-CM | POA: Diagnosis not present

## 2020-05-03 DIAGNOSIS — M5416 Radiculopathy, lumbar region: Secondary | ICD-10-CM | POA: Diagnosis not present

## 2020-05-17 DIAGNOSIS — M62830 Muscle spasm of back: Secondary | ICD-10-CM | POA: Diagnosis not present

## 2020-05-24 ENCOUNTER — Telehealth: Payer: Self-pay | Admitting: Family Medicine

## 2020-05-24 DIAGNOSIS — J45998 Other asthma: Secondary | ICD-10-CM | POA: Diagnosis not present

## 2020-05-24 DIAGNOSIS — G4733 Obstructive sleep apnea (adult) (pediatric): Secondary | ICD-10-CM | POA: Diagnosis not present

## 2020-05-24 NOTE — Telephone Encounter (Signed)
I called pt and he has not gotten his cpap machine from the 09-28-19 appt and order placed.  His DME is [facility].  They stated that needed a new order, but I see that 09-28-19 would have been a year times.  The last order in 10/21 was a replace the 09-2019 order and it only said supplies.  (that was our error).  I called [facility] and LMVM for sharon about getting new cpap (resmed) is what he has.

## 2020-05-24 NOTE — Telephone Encounter (Signed)
Pt left voicemail asking for a call to discuss his CPAP

## 2020-05-25 NOTE — Telephone Encounter (Signed)
Faxed orders to [facility] with demog,  insurance, ofv note, ss, order. Received confirmation [id number].  mychart message with DME name and # sent to pt.

## 2020-05-25 NOTE — Telephone Encounter (Signed)
I called and spoke to angie at [facility].  They stated they did not have any resmed machines available, Jun 02, 2019 was the date given to me as new series would be coming in.  I called [facility] and LM for LUNA machine if able to get fo pt.  I called pt and relayed this information.  He was ok to use new DME [facility] for new cpap and I will fax order to them.

## 2020-05-26 NOTE — Telephone Encounter (Signed)
Spoke to stephanie at [facility].  I relayed the plan for pt.  She will keep the order active at this time for new cpap and will update as needed.

## 2020-05-31 DIAGNOSIS — K760 Fatty (change of) liver, not elsewhere classified: Secondary | ICD-10-CM | POA: Diagnosis not present

## 2020-05-31 DIAGNOSIS — E1140 Type 2 diabetes mellitus with diabetic neuropathy, unspecified: Secondary | ICD-10-CM | POA: Diagnosis not present

## 2020-05-31 DIAGNOSIS — R0609 Other forms of dyspnea: Secondary | ICD-10-CM | POA: Diagnosis not present

## 2020-05-31 DIAGNOSIS — G629 Polyneuropathy, unspecified: Secondary | ICD-10-CM | POA: Diagnosis not present

## 2020-05-31 DIAGNOSIS — E1165 Type 2 diabetes mellitus with hyperglycemia: Secondary | ICD-10-CM | POA: Diagnosis not present

## 2020-05-31 DIAGNOSIS — E662 Morbid (severe) obesity with alveolar hypoventilation: Secondary | ICD-10-CM | POA: Diagnosis not present

## 2020-05-31 DIAGNOSIS — I77810 Thoracic aortic ectasia: Secondary | ICD-10-CM | POA: Diagnosis not present

## 2020-05-31 DIAGNOSIS — I10 Essential (primary) hypertension: Secondary | ICD-10-CM | POA: Diagnosis not present

## 2020-05-31 DIAGNOSIS — I5189 Other ill-defined heart diseases: Secondary | ICD-10-CM | POA: Diagnosis not present

## 2020-06-22 DIAGNOSIS — Z85820 Personal history of malignant melanoma of skin: Secondary | ICD-10-CM | POA: Diagnosis not present

## 2020-06-22 DIAGNOSIS — D2261 Melanocytic nevi of right upper limb, including shoulder: Secondary | ICD-10-CM | POA: Diagnosis not present

## 2020-06-22 DIAGNOSIS — D225 Melanocytic nevi of trunk: Secondary | ICD-10-CM | POA: Diagnosis not present

## 2020-06-22 DIAGNOSIS — L720 Epidermal cyst: Secondary | ICD-10-CM | POA: Diagnosis not present

## 2020-06-22 DIAGNOSIS — L918 Other hypertrophic disorders of the skin: Secondary | ICD-10-CM | POA: Diagnosis not present

## 2020-06-22 DIAGNOSIS — L821 Other seborrheic keratosis: Secondary | ICD-10-CM | POA: Diagnosis not present

## 2020-06-22 DIAGNOSIS — D2262 Melanocytic nevi of left upper limb, including shoulder: Secondary | ICD-10-CM | POA: Diagnosis not present

## 2020-06-22 DIAGNOSIS — D1801 Hemangioma of skin and subcutaneous tissue: Secondary | ICD-10-CM | POA: Diagnosis not present

## 2020-08-16 DIAGNOSIS — M961 Postlaminectomy syndrome, not elsewhere classified: Secondary | ICD-10-CM | POA: Diagnosis not present

## 2020-08-16 DIAGNOSIS — M5416 Radiculopathy, lumbar region: Secondary | ICD-10-CM | POA: Diagnosis not present

## 2020-08-23 DIAGNOSIS — G4733 Obstructive sleep apnea (adult) (pediatric): Secondary | ICD-10-CM | POA: Diagnosis not present

## 2020-08-23 DIAGNOSIS — J45998 Other asthma: Secondary | ICD-10-CM | POA: Diagnosis not present

## 2020-08-26 DIAGNOSIS — G4733 Obstructive sleep apnea (adult) (pediatric): Secondary | ICD-10-CM | POA: Diagnosis not present

## 2020-08-26 DIAGNOSIS — I10 Essential (primary) hypertension: Secondary | ICD-10-CM | POA: Diagnosis not present

## 2020-08-26 DIAGNOSIS — R0601 Orthopnea: Secondary | ICD-10-CM | POA: Diagnosis not present

## 2020-08-26 DIAGNOSIS — G629 Polyneuropathy, unspecified: Secondary | ICD-10-CM | POA: Diagnosis not present

## 2020-08-26 DIAGNOSIS — I5189 Other ill-defined heart diseases: Secondary | ICD-10-CM | POA: Diagnosis not present

## 2020-08-26 DIAGNOSIS — E1165 Type 2 diabetes mellitus with hyperglycemia: Secondary | ICD-10-CM | POA: Diagnosis not present

## 2020-08-26 DIAGNOSIS — I77810 Thoracic aortic ectasia: Secondary | ICD-10-CM | POA: Diagnosis not present

## 2020-08-26 DIAGNOSIS — E1140 Type 2 diabetes mellitus with diabetic neuropathy, unspecified: Secondary | ICD-10-CM | POA: Diagnosis not present

## 2020-08-30 ENCOUNTER — Other Ambulatory Visit (HOSPITAL_COMMUNITY): Payer: Self-pay | Admitting: Internal Medicine

## 2020-08-30 DIAGNOSIS — R0602 Shortness of breath: Secondary | ICD-10-CM

## 2020-09-01 ENCOUNTER — Other Ambulatory Visit: Payer: Self-pay

## 2020-09-01 ENCOUNTER — Ambulatory Visit (HOSPITAL_COMMUNITY): Payer: Medicare PPO | Attending: Cardiology

## 2020-09-01 DIAGNOSIS — R0602 Shortness of breath: Secondary | ICD-10-CM | POA: Diagnosis not present

## 2020-09-01 DIAGNOSIS — I10 Essential (primary) hypertension: Secondary | ICD-10-CM | POA: Diagnosis not present

## 2020-09-01 LAB — ECHOCARDIOGRAM COMPLETE
Area-P 1/2: 1.76 cm2
S' Lateral: 2.8 cm

## 2020-09-01 MED ORDER — PERFLUTREN LIPID MICROSPHERE
1.0000 mL | INTRAVENOUS | Status: AC | PRN
  Administered 2020-09-01: 2 mL via INTRAVENOUS

## 2020-09-26 DIAGNOSIS — E1165 Type 2 diabetes mellitus with hyperglycemia: Secondary | ICD-10-CM | POA: Diagnosis not present

## 2020-09-26 DIAGNOSIS — G4733 Obstructive sleep apnea (adult) (pediatric): Secondary | ICD-10-CM | POA: Diagnosis not present

## 2020-09-26 DIAGNOSIS — E1140 Type 2 diabetes mellitus with diabetic neuropathy, unspecified: Secondary | ICD-10-CM | POA: Diagnosis not present

## 2020-09-26 DIAGNOSIS — R0601 Orthopnea: Secondary | ICD-10-CM | POA: Diagnosis not present

## 2020-09-26 DIAGNOSIS — I77810 Thoracic aortic ectasia: Secondary | ICD-10-CM | POA: Diagnosis not present

## 2020-09-26 DIAGNOSIS — I5189 Other ill-defined heart diseases: Secondary | ICD-10-CM | POA: Diagnosis not present

## 2020-09-26 DIAGNOSIS — R0609 Other forms of dyspnea: Secondary | ICD-10-CM | POA: Diagnosis not present

## 2020-09-26 DIAGNOSIS — I10 Essential (primary) hypertension: Secondary | ICD-10-CM | POA: Diagnosis not present

## 2020-09-27 DIAGNOSIS — G4733 Obstructive sleep apnea (adult) (pediatric): Secondary | ICD-10-CM | POA: Diagnosis not present

## 2020-10-28 ENCOUNTER — Other Ambulatory Visit: Payer: Self-pay | Admitting: Cardiovascular Disease

## 2020-10-28 DIAGNOSIS — G4733 Obstructive sleep apnea (adult) (pediatric): Secondary | ICD-10-CM | POA: Diagnosis not present

## 2020-11-27 DIAGNOSIS — G4733 Obstructive sleep apnea (adult) (pediatric): Secondary | ICD-10-CM | POA: Diagnosis not present

## 2020-12-08 ENCOUNTER — Other Ambulatory Visit: Payer: Self-pay | Admitting: Cardiovascular Disease

## 2020-12-08 NOTE — Telephone Encounter (Signed)
Rx(s) sent to pharmacy electronically.  

## 2020-12-20 NOTE — Progress Notes (Signed)
PATIENT: Tommy [name] DOB: 1946/01/04  REASON FOR VISIT: follow up HISTORY FROM: patient  Chief Complaint  Patient presents with   Obstructive Sleep Apnea    Rm 12, alone. Pt here for initial cpap f/u, pt reports doing well, no issues or concerns.       HISTORY OF PRESENT ILLNESS: 12/21/2020 ALL: Tommy [name] returns for follow up for OSA on CPAP. He received new CPAP machine 09/2020. He is doing well. He feels that he tolerates new machine much better. He is now using [employer] for DME. He does have some congestion in the mornings. Using Nasonex and Atrovent nasal spray.     09/23/2019 ALL: Tommy [name] is a 75 y.o. male here today for follow up for OSA on CPAP.  He is doing very well today.  He continues CPAP therapy nightly and for greater than 4 hours each night.  He reports he cannot sleep without using CPAP.  He has noted that his machine does not power on at times.  He has to press the power button several times before it will start.  His machine is approximately 75 years old.  He would like to consider getting a new CPAP machine.  Otherwise he is doing very well and has no concerns.  Compliance report dated 08/23/2019 through 09/21/2019 reveals that he is used CPAP therapy every night for compliance of 100%.  He has used CPAP 4 hours every night.  Average usage was 6 hours and 33 minutes.  Residual AHI was 3.0 on 10 to 16 cm of water and an EPR of 3.  There was no significant leak noted.  HISTORY: (copied from my note on 10/12/2018)  Tommy [name] is a 75 y.o. male for follow up for OSA on CPAP. He continues to do well with therapy and has no concerns today.    09/15/2018 - 10/14/2018  - 10/14/2018 Usage days 30/30 days (100%) >= 4 hours 30 days (100%) < 4 hours 0 days (0%) Usage hours 203 hours 26 minutes Average usage (total days) 6 hours 47 minutes Average usage (days used) 6 hours 47 minutes Median usage (days used) 6 hours 48 minutes Total used hours  (value since last reset - 10/14/2018) 11,335 hours AirSense 10 AutoSet Serial number [id number] Mode AutoSet Min Pressure 10 cmH2O Max Pressure 16 cmH2O EPR Fulltime EPR level 3 Therapy Pressure - cmH2O Median: 11.0 95th percentile: 12.5 Maximum: 13.6 Leaks - L/min Median: 3.4 95th percentile: 12.1 Maximum: 16.4 Events per hour AI: 2.2 HI: 0.6 AHI: 2.8 Apnea Index Central: 0.8 Obstructive: 1.3 Unknown: 0.0 RERA Index 0.0 Cheyne-Stokes respiration (average duration per night) 10 minutes (3%)   History (copied from Dr Dohmeier's note on 10/10/2017)    Interval history from 10 Oct 2017, I have the pleasure of meeting with Tommy [name]. Och today who underwent a replacement surgery under the guidance of Dr. Posey Pronto.  His right hip is now healed recovered and he is fairly mobile.  He reports that in the last 6 weeks he has done more project in the whole year before.  In order to get ready for surgery and limit his surgical risk he was able to lose weight but he has already gained back 20 pounds.  But it was previously hip pain that interrupted and fragmented his sleep it is now nocturia.  He states that he has no longer blocks of 4 interrupted interrupted hours of sleep but only about 2.   He has  been as in the years before 100% compliant with CPAP he is using an AutoSet between 10 and 16 cmH2O with 3 cm EPR and this window covers his current pressure needs.  His pressure at the 95th percentile is 11.9 cmH2O so well was in the window.  Residual apnea and hypercapnia index AHI was 1.5/h and they were 3 minutes or 1% of the night of Cheyne-Stokes respirations. He takes a muscle relaxant at night.  DME is [facility]-mask type nasal pillow, air fit P 10  Epworth 5 points, not bad. FSS at 35, geriatric depression score is 2/15. He sleeps so much better with CPAP, noted the difference when his home lost power.    Fam history: Brother has now OSA with CPAP.  Patient reports vivid dreams in AM - not  enacting them.    Interval history from 10/10/2016 area at the pleasure of seeing Tommy [name] today, who is diabetic, hypertensive and was diagnosed with obstructive sleep apnea in 2015. His diabetes is currently treated with invokana- this causes some nocturia. He is compliant with his CPAP use, uses an AutoSet between 10 and 16 cm water with 3 cm EPR achieving a residual AHI of 1.2 apneas per hour. His 100% compliant by days and hours of use was an average user time of 6 hours and 50 minutes. He is using an air sense machine by [employer] and nasal pillows. No adjustments have to be made on this side. He still uses CPAP without ramp function and with a high starting pressure. I will repeat an ONO to see if he still needs oxygen. He likes to travel and oxygen need complicates things. He has hip pain, awaiting a consultation with Dr. Maureen Ralphs.   His wife is on CPAP, too.      HISTORY OF PRESENT ILLNESS: Tommy [name] is a 75 y.o.caucasian , right handed  male , who is seen here as a referral from Dr. Virgina Jock for sleep evaluation.  The patient  Is a retired Designer, jewellery, Music therapist. He has sleep problems of nocturia, snoring and GERD in sleep, EDS . He also has a history of osteoarthritis allergic rhinitis, GERD, hypertension, fatty liver disease. He has a family history of heart disease or hypertension. Tommy [name] reports that she had back surgery ( laminectomy in  level L4-L5  ) and had reported to his orthopedists that he was excessively daytime sleepy and had trouble staying awake when driving longer distances. Dr. Rolena Infante asked him to make his primary care physician aware of this problem.   Prior to his back surgery in 2014 the patient was used to sleep in a recliner mainly because he could not find a comfortable position in bed. Since his back surgery this has improved tremendously and he is now sleeping in a regular bed. He has aches and pain when exercising, gained weight even after back  surgery, he gained 60 pounds before surgery in only 3 years. He is frequently short of breath when just speaking, not exercising.He has a sister with heart disease, a brother with obstructive sleep apnea ,  mother but was diagnosed as obstructive sleep apnea and his father is deceased with congestive heart failure.    Interval history 04-07-14 CD Tommy [name] underwent a split night polysomnography on 02-01-14 after endorsing the Epworth Sleepiness Scale at 16 points and presenting with a neck circumference of 20 inches and a BMI of 48.5. The patient's AHI was 43.1 he did not have any REM sleep, he  did not sleep supine for these 2 hours of diagnostic study - his CO2 increased to 54.44. His oxygen nadir was 84% for 43.1 minutes the patient was titrated and interestingly his oxygen saturations actually decreased further 164 minutes of desaturations were found during the CPAP titration with a nadir at 71% [name]. And he had nocturia several times at night. He was fitted with an O2 pressure window between 10 and 16 cm water. We also recommended possible oxygen therapy should his hypoxemia continue on CPAP. Today is the patient's first visit after sleep study and he has noted some remarkable differences for example his Epworth Sleepiness Scale is now 10 and his fatigue severity score 41 points. The first 30 days of use he struggled with his CPAP and mostly slept in a recliner. He now has adjusted quite well at 30 day download dated 04-05-16 chills 5 hours and 33 minutes of daily use 100% compliance of full-time EPR level of 3 cm water and a pressure of 12.9 cm water at the 91st percentile. His residual AHI is 1.3. There were no Cheyne-Stokes respirations noted. An adjustment of settings is not necessary but the patient wishes to not have a ramp function that. He has likely to use some additional oxygen. ONO showed on CPAP low oxygenation.  He has taken off the RAMP function  and reduced the humidity.   He has not fallen asleep in church since being on CPAP.    REVIEW OF SYSTEMS: Out of a complete 14 system review of symptoms, the patient complains only of the following symptoms, morning congestion, chronic hip pain, and all other reviewed systems are negative.    ALLERGIES: Allergies  Allergen Reactions   Micardis [Telmisartan] Hives and Rash    HOME MEDICATIONS: Outpatient Medications Prior to Visit  Medication Sig Dispense Refill   ACCU-CHEK AVIVA PLUS test strip      Accu-Chek Softclix Lancets lancets      amLODipine (NORVASC) 10 MG tablet Take 10 mg by mouth every morning.      aspirin EC 81 MG tablet Take 81 mg by mouth daily.     doxazosin (CARDURA) 2 MG tablet TAKE 1 TABLET BY MOUTH 2 TIMES DAILY. TO BE TAKEN WITH THE 4 MG TABLETS. 180 tablet 1   doxazosin (CARDURA) 4 MG tablet TAKE 1 TABLET BY MOUTH 2 TIMES DAILY. 180 tablet 3   FIBER PO Take 3 tablets by mouth every morning.      furosemide (LASIX) 40 MG tablet Take 40 mg by mouth 2 (two) times a week.     gabapentin (NEURONTIN) 100 MG capsule gabapentin 100 mg capsule     Glucosamine-Chondroit-Vit C-Mn (GLUCOSAMINE 1500 COMPLEX PO) Take by mouth.     INVOKANA 100 MG TABS tablet      ipratropium (ATROVENT) 0.03 % nasal spray Place 1 spray into both nostrils at bedtime as needed for rhinitis.      metFORMIN (GLUCOPHAGE) 500 MG tablet Take 500 mg by mouth daily with breakfast.      mometasone (NASONEX) 50 MCG/ACT nasal spray Place 2 sprays into the nose daily.     montelukast (SINGULAIR) 10 MG tablet Take 10 mg by mouth every morning.     Multiple Vitamin (MULTIVITAMIN) capsule Take 1 capsule by mouth daily.     naproxen (NAPROSYN) 500 MG tablet Take 500 mg by mouth as needed.     Nebivolol HCl 20 MG TABS TAKE 1 TABLET BY MOUTH  DAILY. 90 tablet 3   rosuvastatin (CRESTOR) 5 MG tablet rosuvastatin 5 mg tablet     valsartan-hydrochlorothiazide (DIOVAN-HCT) 320-25 MG per tablet Take 1 tablet by  mouth every morning.     No facility-administered medications prior to visit.    PAST MEDICAL HISTORY: Past Medical History:  Diagnosis Date   Allergy    Anemia    "as teenager"   Arthritis    hands, hip   Back pain    BPPV (benign paroxysmal positional vertigo)    History of   Cancer ([place])    "melanoma removed from leg years ago", malignant mole   Chronic back pain    Complication of anesthesia    woke up and was confused and combative after back surgery   Constipation    DDD (degenerative disc disease), lumbar    Diabetes mellitus without complication ([place])    Diabetic neuropathy ([place])    ED (erectile dysfunction)    Essential hypertension 10/16/2015   Fatty liver    First degree AV block    EKG 12/03/16   GERD (gastroesophageal reflux disease)    diet controlled, no meds currently, history of   H/O hiatal hernia    History of elevated PSA    Hypertension    sees Dr. Virgina Jock   Nocturia more than twice per night 12/21/2013   OA (osteoarthritis)    Obesity hypoventilation syndrome ([place]) 04/07/2014   Obesity, morbid ([place]) 12/21/2013   OSA on CPAP 04/07/2014   RBBB    previous EKG   Sleep apnea    uses CPAP nightly   Snoring 12/21/2013   Spondylosis 2014   lumbar back, noted on Tommy    Ulcer    stomach ulcer "as teenager"    PAST SURGICAL HISTORY: Past Surgical History:  Procedure Laterality Date   BACK SURGERY     "done approx. 24 years ago"   CARDIOVASCULAR STRESS TEST     "done approx 10 years ago, Dr. Virgina Jock"   COLONOSCOPY  2006   hx of "with removal of polyps"/[facility]   COLONOSCOPY WITH PROPOFOL N/A 09/06/2015   Procedure: COLONOSCOPY WITH PROPOFOL;  Surgeon: Ladene Artist, MD;  Location: [facility];  Service: Endoscopy;  Laterality: N/A;   lipoma removed     from leg   LUMBAR LAMINECTOMY/DECOMPRESSION MICRODISCECTOMY  06/19/2012   Procedure: LUMBAR LAMINECTOMY/DECOMPRESSION MICRODISCECTOMY 1 LEVEL;  Surgeon: Melina Schools, MD;  Location: [facility];  Service:  Orthopedics;  Laterality: Left;  L2-3 LEFT MICRODISCECTOMY   thigh surgery     fatty deposit   TONSILLECTOMY     TOTAL HIP ARTHROPLASTY Right 06/05/2017   Procedure: RIGHT TOTAL HIP ARTHROPLASTY ANTERIOR APPROACH;  Surgeon: Gaynelle Arabian, MD;  Location: [facility];  Service: Orthopedics;  Laterality: Right;  Dr. requested 120 minutes   WISDOM TOOTH EXTRACTION      FAMILY HISTORY: Family History  Problem Relation Age of Onset   Dementia Mother    Colon polyps Mother    Heart failure Father    Breast cancer Sister    Heart attack Sister    Thyroid disease Sister    Colon cancer Neg Hx    Esophageal cancer Neg Hx    Rectal cancer Neg Hx    Stomach cancer Neg Hx     SOCIAL HISTORY: Social History   Socioeconomic History   Marital status: Married    Spouse name: [name]   Number of children: 2   Years of education: Masters   [employer] education  level: Not on file  Occupational History    Employer: RETIRED  Tobacco Use   Smoking status: Never   Smokeless tobacco: Never  Vaping Use   Vaping Use: Never used  Substance and Sexual Activity   Alcohol use: No    Alcohol/week: 0.0 standard drinks   Drug use: No   Sexual activity: Not on file  Other Topics Concern   Not on file  Social History Narrative   Patient is married [name]) and lives at home with his wife.   Patient has two adult children.   Patient is retired.   Patient has a Master's degree.   Patient is right-handed   Patient does not drink any caffeine.   Social Determinants of Health   Financial Resource Strain: Not on file  Food Insecurity: Not on file  Transportation Needs: Not on file  Physical Activity: Not on file  Stress: Not on file  Social Connections: Not on file  Intimate Partner Violence: Not on file      PHYSICAL EXAM  Vitals:   12/21/20 0958  BP: (!) 152/83  Pulse: (!) 55  Weight: (!) 371 lb (168.3 kg)  Height: '6\' 1"'$  (1.854 m)    Body mass index is 48.95 kg/m.  Generalized:  Well developed, in no acute distress  Cardiology: normal rate and rhythm, no murmur noted Respiratory: clear to auscultation bilaterally  Neurological examination  Mentation: Alert oriented to time, place, history taking. Follows all commands speech and language fluent Cranial nerve II-XII: Pupils were equal round reactive to light. Extraocular movements were full, visual field were full  Motor: The motor testing reveals 5 over 5 strength of all 4 extremities. Good symmetric motor tone is noted throughout.  Gait and station: Gait is normal.    DIAGNOSTIC DATA (LABS, IMAGING, TESTING) - I reviewed patient records, labs, notes, testing and imaging myself where available.  No flowsheet data found.   Lab Results  Component Value Date   WBC 17.2 (H) 06/06/2017   HGB 13.9 06/06/2017   HCT 41.8 06/06/2017   MCV 90.3 06/06/2017   PLT 248 06/06/2017      Component Value Date/Time   NA 139 06/06/2017 0601   K 3.6 06/06/2017 0601   CL 101 06/06/2017 0601   CO2 28 06/06/2017 0601   GLUCOSE 155 (H) 06/06/2017 0601   BUN 15 06/06/2017 0601   CREATININE 0.72 06/06/2017 0601   CALCIUM 9.0 06/06/2017 0601   PROT 7.4 05/30/2017 0902   ALBUMIN 4.2 05/30/2017 0902   AST 21 05/30/2017 0902   ALT 17 05/30/2017 0902   ALKPHOS 115 05/30/2017 0902   BILITOT 1.2 05/30/2017 0902   GFRNONAA >60 06/06/2017 0601   GFRAA >60 06/06/2017 0601   No results found for: CHOL, HDL, LDLCALC, LDLDIRECT, TRIG, CHOLHDL Lab Results  Component Value Date   HGBA1C 5.2 05/30/2017   No results found for: VITAMINB12 No results found for: TSH     ASSESSMENT AND PLAN 75 y.o. year old male  has a past medical history of Allergy, Anemia, Arthritis, Back pain, BPPV (benign paroxysmal positional vertigo), Cancer ([facility]), Chronic back pain, Complication of anesthesia, Constipation, DDD (degenerative disc disease), lumbar, Diabetes mellitus without complication ([place]), Diabetic neuropathy ([place]), ED (erectile dysfunction),  Essential hypertension (10/16/2015), Fatty liver, First degree AV block, GERD (gastroesophageal reflux disease), H/O hiatal hernia, History of elevated PSA, Hypertension, Nocturia more than twice per night (12/21/2013), OA (osteoarthritis), Obesity hypoventilation syndrome ([place]) (04/07/2014), Obesity, morbid ([place]) (12/21/2013), OSA on CPAP (04/07/2014),  RBBB, Sleep apnea, Snoring (12/21/2013), Spondylosis (2014), and Ulcer. here with     ICD-10-CM   1. OSA on CPAP  G47.33    Z99.89        Tommy [name] is doing well with his new CPAP machine.  Compliance report reveals excellent compliance.  He was encouraged to continue using CPAP nightly and for greater than 4 hours each night. May consider adding daily antihistamine like Claritin or Zyrtec pending PCP approval. Healthy lifestyle habits with well-balanced diet regular exercise advised.  He will return to see me pending initiation of new CPAP.  He verbalizes understanding and agreement with this plan.   No orders of the defined types were placed in this encounter.    No orders of the defined types were placed in this encounter.      Debbora Presto, FNP-C 12/21/2020, 10:21 AM [facility] [address] [place], [place] [address] [phone]

## 2020-12-20 NOTE — Patient Instructions (Signed)

## 2020-12-21 ENCOUNTER — Ambulatory Visit: Payer: Medicare PPO | Admitting: Family Medicine

## 2020-12-21 ENCOUNTER — Encounter: Payer: Self-pay | Admitting: Family Medicine

## 2020-12-21 VITALS — BP 152/83 | HR 55 | Ht 73.0 in | Wt 371.0 lb

## 2020-12-21 DIAGNOSIS — G4733 Obstructive sleep apnea (adult) (pediatric): Secondary | ICD-10-CM | POA: Diagnosis not present

## 2020-12-21 DIAGNOSIS — Z9989 Dependence on other enabling machines and devices: Secondary | ICD-10-CM | POA: Diagnosis not present

## 2020-12-27 DIAGNOSIS — J45998 Other asthma: Secondary | ICD-10-CM | POA: Diagnosis not present

## 2020-12-27 DIAGNOSIS — G4733 Obstructive sleep apnea (adult) (pediatric): Secondary | ICD-10-CM | POA: Diagnosis not present

## 2020-12-28 DIAGNOSIS — G4733 Obstructive sleep apnea (adult) (pediatric): Secondary | ICD-10-CM | POA: Diagnosis not present

## 2020-12-29 ENCOUNTER — Ambulatory Visit: Payer: Medicare PPO | Admitting: Neurology

## 2021-01-28 DIAGNOSIS — G4733 Obstructive sleep apnea (adult) (pediatric): Secondary | ICD-10-CM | POA: Diagnosis not present

## 2021-01-31 DIAGNOSIS — Z125 Encounter for screening for malignant neoplasm of prostate: Secondary | ICD-10-CM | POA: Diagnosis not present

## 2021-01-31 DIAGNOSIS — E1165 Type 2 diabetes mellitus with hyperglycemia: Secondary | ICD-10-CM | POA: Diagnosis not present

## 2021-01-31 DIAGNOSIS — I10 Essential (primary) hypertension: Secondary | ICD-10-CM | POA: Diagnosis not present

## 2021-02-07 DIAGNOSIS — G629 Polyneuropathy, unspecified: Secondary | ICD-10-CM | POA: Diagnosis not present

## 2021-02-07 DIAGNOSIS — Z23 Encounter for immunization: Secondary | ICD-10-CM | POA: Diagnosis not present

## 2021-02-07 DIAGNOSIS — E1140 Type 2 diabetes mellitus with diabetic neuropathy, unspecified: Secondary | ICD-10-CM | POA: Diagnosis not present

## 2021-02-07 DIAGNOSIS — E786 Lipoprotein deficiency: Secondary | ICD-10-CM | POA: Diagnosis not present

## 2021-02-07 DIAGNOSIS — Z1389 Encounter for screening for other disorder: Secondary | ICD-10-CM | POA: Diagnosis not present

## 2021-02-07 DIAGNOSIS — Z1212 Encounter for screening for malignant neoplasm of rectum: Secondary | ICD-10-CM | POA: Diagnosis not present

## 2021-02-07 DIAGNOSIS — R0601 Orthopnea: Secondary | ICD-10-CM | POA: Diagnosis not present

## 2021-02-07 DIAGNOSIS — Z1331 Encounter for screening for depression: Secondary | ICD-10-CM | POA: Diagnosis not present

## 2021-02-07 DIAGNOSIS — E1165 Type 2 diabetes mellitus with hyperglycemia: Secondary | ICD-10-CM | POA: Diagnosis not present

## 2021-02-07 DIAGNOSIS — Z0000 Encounter for general adult medical examination without abnormal findings: Secondary | ICD-10-CM | POA: Diagnosis not present

## 2021-02-07 DIAGNOSIS — I77810 Thoracic aortic ectasia: Secondary | ICD-10-CM | POA: Diagnosis not present

## 2021-02-07 DIAGNOSIS — R82998 Other abnormal findings in urine: Secondary | ICD-10-CM | POA: Diagnosis not present

## 2021-02-07 DIAGNOSIS — I10 Essential (primary) hypertension: Secondary | ICD-10-CM | POA: Diagnosis not present

## 2021-02-07 LAB — IFOBT (OCCULT BLOOD): IFOBT: POSITIVE

## 2021-02-08 ENCOUNTER — Encounter: Payer: Self-pay | Admitting: Gastroenterology

## 2021-02-20 DIAGNOSIS — M25561 Pain in right knee: Secondary | ICD-10-CM | POA: Diagnosis not present

## 2021-02-20 DIAGNOSIS — M1711 Unilateral primary osteoarthritis, right knee: Secondary | ICD-10-CM | POA: Diagnosis not present

## 2021-02-27 DIAGNOSIS — G4733 Obstructive sleep apnea (adult) (pediatric): Secondary | ICD-10-CM | POA: Diagnosis not present

## 2021-03-01 DIAGNOSIS — Z6841 Body Mass Index (BMI) 40.0 and over, adult: Secondary | ICD-10-CM | POA: Diagnosis not present

## 2021-03-01 DIAGNOSIS — M1711 Unilateral primary osteoarthritis, right knee: Secondary | ICD-10-CM | POA: Diagnosis not present

## 2021-03-02 ENCOUNTER — Encounter: Payer: Self-pay | Admitting: Family Medicine

## 2021-03-09 DIAGNOSIS — I10 Essential (primary) hypertension: Secondary | ICD-10-CM | POA: Diagnosis not present

## 2021-03-09 DIAGNOSIS — E1140 Type 2 diabetes mellitus with diabetic neuropathy, unspecified: Secondary | ICD-10-CM | POA: Diagnosis not present

## 2021-03-20 DIAGNOSIS — D3131 Benign neoplasm of right choroid: Secondary | ICD-10-CM | POA: Diagnosis not present

## 2021-03-20 DIAGNOSIS — E119 Type 2 diabetes mellitus without complications: Secondary | ICD-10-CM | POA: Diagnosis not present

## 2021-03-20 DIAGNOSIS — H26491 Other secondary cataract, right eye: Secondary | ICD-10-CM | POA: Diagnosis not present

## 2021-03-20 DIAGNOSIS — H52203 Unspecified astigmatism, bilateral: Secondary | ICD-10-CM | POA: Diagnosis not present

## 2021-03-24 ENCOUNTER — Encounter: Payer: Self-pay | Admitting: Gastroenterology

## 2021-03-24 ENCOUNTER — Ambulatory Visit: Payer: Medicare PPO | Admitting: Gastroenterology

## 2021-03-24 VITALS — BP 144/78 | HR 64 | Ht 73.0 in | Wt 360.0 lb

## 2021-03-24 DIAGNOSIS — Z86010 Personal history of colonic polyps: Secondary | ICD-10-CM | POA: Diagnosis not present

## 2021-03-24 DIAGNOSIS — R195 Other fecal abnormalities: Secondary | ICD-10-CM

## 2021-03-24 MED ORDER — PLENVU 140 G PO SOLR: 1.0000 | Freq: Once | ORAL | 0 refills | Status: AC

## 2021-03-24 NOTE — Patient Instructions (Signed)
You have been scheduled for a colonoscopy. Please follow written instructions given to you at your visit today.  Please pick up your prep supplies at the pharmacy within the next 1-3 days. If you use inhalers (even only as needed), please bring them with you on the day of your procedure.  The [facility] providers would like to encourage you to use [facility] to communicate with providers for non-urgent requests or questions.  Due to long hold times on the telephone, sending your provider a message by [facility] may be a faster and more efficient way to get a response.  Please allow 48 business hours for a response.  Please remember that this is for non-urgent requests.   Normal BMI (Body Mass Index- based on height and weight) is between 23 and 30. Your BMI today is Body mass index is 47.5 kg/m. Marland Kitchen Please consider follow up  regarding your BMI with your Primary Care Provider.  Due to recent changes in healthcare laws, you may see the results of your imaging and laboratory studies on MyChart before your provider has had a chance to review them.  We understand that in some cases there may be results that are confusing or concerning to you. Not all laboratory results come back in the same time frame and the provider may be waiting for multiple results in order to interpret others.  Please give [place] 48 hours in order for your provider to thoroughly review all the results before contacting the office for clarification of your results.   Thank you for choosing me and [facility].  Pricilla Riffle. Dagoberto Ligas., MD., Marval Regal

## 2021-03-24 NOTE — Progress Notes (Signed)
History of Present Illness: This is a 75 year old male referred by Shon Baton, MD for the evaluation of personal history of adenomatous polyps and Hemosure positive stool.  He occasionally notes small amounts of blood on the tissue paper after wiping that he attributes to hemorrhoids.  He has no other digestive complaints.  He was sure was obtained as part of a routine physical exam.  His last colonoscopy was performed in April 2017 showing 2 small adenomatous colon polyps, diverticulosis and internal hemorrhoids. Denies weight loss, abdominal pain, constipation, diarrhea, change in stool caliber, melena, nausea, vomiting, dysphagia, reflux symptoms, chest pain.    Allergies  Allergen Reactions   Micardis [Telmisartan] Hives and Rash   Outpatient Medications Prior to Visit  Medication Sig Dispense Refill   ACCU-CHEK AVIVA PLUS test strip      Accu-Chek Softclix Lancets lancets      amLODipine (NORVASC) 10 MG tablet Take 10 mg by mouth every morning.      aspirin EC 81 MG tablet Take 81 mg by mouth daily.     doxazosin (CARDURA) 2 MG tablet TAKE 1 TABLET BY MOUTH 2 TIMES DAILY. TO BE TAKEN WITH THE 4 MG TABLETS. 180 tablet 1   doxazosin (CARDURA) 4 MG tablet TAKE 1 TABLET BY MOUTH 2 TIMES DAILY. 180 tablet 3   FIBER PO Take 3 tablets by mouth every morning.      furosemide (LASIX) 40 MG tablet Take 40 mg by mouth 2 (two) times a week.     gabapentin (NEURONTIN) 100 MG capsule gabapentin 100 mg capsule     Glucosamine-Chondroit-Vit C-Mn (GLUCOSAMINE 1500 COMPLEX PO) Take by mouth.     INVOKANA 100 MG TABS tablet      ipratropium (ATROVENT) 0.03 % nasal spray Place 1 spray into both nostrils at bedtime as needed for rhinitis.      metFORMIN (GLUCOPHAGE) 500 MG tablet Take 500 mg by mouth daily with breakfast.      mometasone (NASONEX) 50 MCG/ACT nasal spray Place 2 sprays into the nose daily.     montelukast (SINGULAIR) 10 MG tablet Take 10 mg by mouth every morning.     Multiple Vitamin  (MULTIVITAMIN) capsule Take 1 capsule by mouth daily.     naproxen (NAPROSYN) 500 MG tablet Take 500 mg by mouth as needed.     rosuvastatin (CRESTOR) 5 MG tablet rosuvastatin 5 mg tablet     tirzepatide (MOUNJARO) 2.5 MG/0.5ML Pen Inject 2.5 mg into the skin once a week.     valsartan-hydrochlorothiazide (DIOVAN-HCT) 320-25 MG per tablet Take 1 tablet by mouth every morning.     Nebivolol HCl 20 MG TABS TAKE 1 TABLET BY MOUTH DAILY. 90 tablet 3   No facility-administered medications prior to visit.   Past Medical History:  Diagnosis Date   Allergy    Anemia    "as teenager"   Arthritis    hands, hip   Back pain    BPPV (benign paroxysmal positional vertigo)    History of   Cancer ([place])    "melanoma removed from leg years ago", malignant mole   Chronic back pain    Complication of anesthesia    woke up and was confused and combative after back surgery   Constipation    DDD (degenerative disc disease), lumbar    Diabetes mellitus without complication ([place])    Diabetic neuropathy [facility])    ED (erectile dysfunction)    Essential hypertension 10/16/2015   Fatty liver  First degree AV block    EKG 12/03/16   GERD (gastroesophageal reflux disease)    diet controlled, no meds currently, history of   H/O hiatal hernia    History of elevated PSA    Hypertension    sees Dr. Virgina Jock   Nocturia more than twice per night 12/21/2013   OA (osteoarthritis)    Obesity hypoventilation syndrome ([place]) 04/07/2014   Obesity, morbid ([place]) 12/21/2013   OSA on CPAP 04/07/2014   RBBB    previous EKG   Sleep apnea    uses CPAP nightly   Snoring 12/21/2013   Spondylosis 2014   lumbar back, noted on MR    Ulcer    stomach ulcer "as teenager"   Past Surgical History:  Procedure Laterality Date   BACK SURGERY     "done approx. 24 years ago"   CARDIOVASCULAR STRESS TEST     "done approx 10 years ago, Dr. Virgina Jock"   COLONOSCOPY  2006   hx of "with removal of polyps"/[place] Stidd   COLONOSCOPY WITH  PROPOFOL N/A 09/06/2015   Procedure: COLONOSCOPY WITH PROPOFOL;  Surgeon: Ladene Artist, MD;  Location: [facility];  Service: Endoscopy;  Laterality: N/A;   lipoma removed     from leg   LUMBAR LAMINECTOMY/DECOMPRESSION MICRODISCECTOMY  06/19/2012   Procedure: LUMBAR LAMINECTOMY/DECOMPRESSION MICRODISCECTOMY 1 LEVEL;  Surgeon: Melina Schools, MD;  Location: [place];  Service: Orthopedics;  Laterality: Left;  L2-3 LEFT MICRODISCECTOMY   thigh surgery     fatty deposit   TONSILLECTOMY     TOTAL HIP ARTHROPLASTY Right 06/05/2017   Procedure: RIGHT TOTAL HIP ARTHROPLASTY ANTERIOR APPROACH;  Surgeon: Gaynelle Arabian, MD;  Location: [facility];  Service: Orthopedics;  Laterality: Right;  Dr. requested 120 minutes   WISDOM TOOTH EXTRACTION     x 1 tooth   Social History   Socioeconomic History   Marital status: Married    Spouse name: [name]   Number of children: 2   Years of education: Masters   Highest education level: Not on file  Occupational History    Employer: RETIRED  Tobacco Use   Smoking status: Never   Smokeless tobacco: Never  Vaping Use   Vaping Use: Never used  Substance and Sexual Activity   Alcohol use: No    Alcohol/week: 0.0 standard drinks   Drug use: No   Sexual activity: Not on file  Other Topics Concern   Not on file  Social History Narrative   Patient is married Estate manager/land agent) and lives at home with his wife.   Patient has two adult children.   Patient is retired.   Patient has a Master's degree.   Patient is right-handed   Patient does not drink any caffeine.   Social Determinants of Health   Financial Resource Strain: Not on file  Food Insecurity: Not on file  Transportation Needs: Not on file  Physical Activity: Not on file  Stress: Not on file  Social Connections: Not on file   Family History  Problem Relation Age of Onset   Dementia Mother    Colon polyps Mother    Heart failure Father    Breast cancer Sister    Heart attack Sister     Thyroid disease Sister    Colon cancer Neg Hx    Esophageal cancer Neg Hx    Rectal cancer Neg Hx    Stomach cancer Neg Hx        Review of Systems: Pertinent positive and negative review of  systems were noted in the above HPI section. All other review of systems were otherwise negative.    Physical Exam: General: Well developed, well nourished, no acute distress Head: Normocephalic and atraumatic Eyes: Sclerae anicteric, EOMI Ears: Normal auditory acuity Mouth: Not examined, mask on during Covid-19 pandemic Neck: Supple, no masses or thyromegaly Lungs: Clear throughout to auscultation Heart: Regular rate and rhythm; no murmurs, rubs or bruits Abdomen: Soft, non tender and non distended. No masses, hepatosplenomegaly or hernias noted. Normal Bowel sounds Rectal: Deferred to colonoscopy  Musculoskeletal: Symmetrical with no gross deformities  Skin: No lesions on visible extremities Pulses:  Normal pulses noted Extremities: No clubbing, cyanosis, edema or deformities noted Neurological: Alert oriented x 4, grossly nonfocal Cervical Nodes:  No significant cervical adenopathy Inguinal Nodes: No significant inguinal adenopathy Psychological:  Alert and cooperative. Normal mood and affect   Assessment and Recommendations:  Personal history of adenomatous colon polyps, Hemosure positive stool, occasional small-volume hematochezia when wiping.  Schedule colonoscopy. The risks (including bleeding, perforation, infection, missed lesions, medication reactions and possible hospitalization or surgery if complications occur), benefits, and alternatives to colonoscopy with possible biopsy and possible polypectomy were discussed with the patient and they consent to proceed.     cc: Shon Baton, MD [address] [place],  [place] [address]

## 2021-04-03 ENCOUNTER — Encounter: Payer: Medicare PPO | Admitting: Gastroenterology

## 2021-04-10 DIAGNOSIS — M1711 Unilateral primary osteoarthritis, right knee: Secondary | ICD-10-CM | POA: Diagnosis not present

## 2021-04-10 DIAGNOSIS — M1712 Unilateral primary osteoarthritis, left knee: Secondary | ICD-10-CM | POA: Diagnosis not present

## 2021-04-10 DIAGNOSIS — Z6841 Body Mass Index (BMI) 40.0 and over, adult: Secondary | ICD-10-CM | POA: Diagnosis not present

## 2021-04-10 DIAGNOSIS — M25562 Pain in left knee: Secondary | ICD-10-CM | POA: Diagnosis not present

## 2021-04-18 ENCOUNTER — Other Ambulatory Visit: Payer: Self-pay

## 2021-04-18 ENCOUNTER — Ambulatory Visit (HOSPITAL_BASED_OUTPATIENT_CLINIC_OR_DEPARTMENT_OTHER): Payer: Medicare PPO | Admitting: Cardiovascular Disease

## 2021-04-18 ENCOUNTER — Encounter (HOSPITAL_BASED_OUTPATIENT_CLINIC_OR_DEPARTMENT_OTHER): Payer: Self-pay | Admitting: Cardiovascular Disease

## 2021-04-18 DIAGNOSIS — Z9989 Dependence on other enabling machines and devices: Secondary | ICD-10-CM

## 2021-04-18 DIAGNOSIS — I10 Essential (primary) hypertension: Secondary | ICD-10-CM

## 2021-04-18 DIAGNOSIS — G4733 Obstructive sleep apnea (adult) (pediatric): Secondary | ICD-10-CM

## 2021-04-18 DIAGNOSIS — E7800 Pure hypercholesterolemia, unspecified: Secondary | ICD-10-CM | POA: Diagnosis not present

## 2021-04-18 HISTORY — DX: Pure hypercholesterolemia, unspecified: E78.00

## 2021-04-18 NOTE — Assessment & Plan Note (Signed)
Continue CPAP.  

## 2021-04-18 NOTE — Assessment & Plan Note (Signed)
Lipids are very well-controlled on low-dose rosuvastatin.  He is mostly on this due to diabetes.

## 2021-04-18 NOTE — Assessment & Plan Note (Signed)
He is losing weight with the help of his new diabetes medicine.  Continue to work on portion size and healthy eating options.  His ability to exercise is limited by orthopedic issues.

## 2021-04-18 NOTE — Assessment & Plan Note (Signed)
Blood pressure was initially elevated but within normal limits on repeat.  It has been very well-controlled at home.  At times it has been a little low.  We will continue his current regimen of amlodipine, doxazosin, furosemide, nebivolol, valsartan, and HCTZ.  Continue to encourage increased exercise as tolerated.

## 2021-04-18 NOTE — Patient Instructions (Signed)
Medication Instructions:  ?Your physician recommends that you continue on your current medications as directed. Please refer to the Current Medication list given to you today.  ? ?*If you need a refill on your cardiac medications before your next appointment, please call your pharmacy* ? ?Lab Work: ?NONE ? ?Testing/Procedures: ?NONE ? ?Follow-Up: ?At [facility], you and your health needs are our priority.  As part of our continuing mission to provide you with exceptional heart care, we have created designated Provider Care Teams.  These Care Teams include your primary Cardiologist (physician) and Advanced Practice Providers (APPs -  Physician Assistants and Nurse Practitioners) who all work together to provide you with the care you need, when you need it. ? ?We recommend signing up for the patient portal called "MyChart".  Sign up information is provided on this After Visit Summary.  MyChart is used to connect with patients for Virtual Visits (Telemedicine).  Patients are able to view lab/test results, encounter notes, upcoming appointments, etc.  Non-urgent messages can be sent to your provider as well.   ?To learn more about what you can do with MyChart, go to [email or web address].   ? ?Your next appointment:   ?12 month(s) ? ?The format for your next appointment:   ?In Person ? ?Provider:   ?Tiffany [place], MD  ? ? ? ? ? ? ?

## 2021-04-18 NOTE — Progress Notes (Signed)
Cardiology Office Note   Date:  04/18/2021   ID:  Tommy [name], DOB [birth year], MRN [id number]  PCP:  Shon Baton, MD  Cardiologist:  Skeet Latch, MD  Electrophysiologist:  None  Evaluation Performed:  Follow-Up Visit  Chief Complaint:  hypertension  History of Present Illness:    Tommy [name] is a 75 y.o. male with asthma, hypertension, GERD, fatty liver disease, OSA on CPAP and morbid obesity who presents for follow up on shortness of breath. Tommy [name] saw his PCP, Dr. Shon Baton, on 09/20/15. At that appointment it was noted that a chest x-ray in January 2014 showed a tortuous aorta and calcifications of the thoracic aorta. He also reported dyspnea on exertion. He was referred to cardiology for further evaluation.  He was referred for a Lexiscan Myoview 10/2015 that was negative for ischemia but the ejection fraction was reported as 30-44%. He subsequently had an echocardiogram that revealed normal systolic function and mild LVH.  Diastolic function was abnormal but the degree of dysfunction was not commented upon and the echo is not currently available for review.   Tommy [name]'s BP remained elevated so doxazosin was increased to 6mg  bid.  At his last appointment, he had chronic shortness of breath which was due to obesity and deconditioning. He was not interested in making any drastic interventions. He saw Dr Virgina Jock and continued to report shortness of breath. He had an echo 4/22 that revealed LVEF 60-65% ad grade 1 diastolic dysfunction. His ascending aorta was 4.5 cm which was unchanged from 2017.  Today, he reports he is doing well.  He needs a bilateral knee replacement surgery but needs to lose about 50 pounds. He will be going to physical therapy on 12/5. He has been eating smaller portions and using Mounjaro which is reducing his appetite. Wt Readings from Last 3 Encounters:  04/18/21 (!) 354 lb (160.6 kg)  03/24/21 (!) 360 lb (163.3 kg)  12/21/20 (!)  371 lb (168.3 kg)    When walking down the hill, there is pain in his knees and back that radiates to his thighs. He also experiences shortness of breath which has caused him to reduce his walking. He is using 100 mg gabapentin to manage his back pain and it causes great relief. He regularly checks his blood pressure at home and keeps a log. There are some low readings like 98/58 but he reports he feels okay and is normally after he takes walks. The systolic readings are between 110-120 and he normally takes them in the morning. His blood pressure was high when he came in so it was rechecked.  BP Readings from Last 3 Encounters:  04/18/21 120/64  03/24/21 (!) 144/78  12/21/20 (!) 152/83    His cholesterol levels are good.He denies chest pain, palpitations, lightheadedness, headaches, syncope, LE edema, orthopnea, PND.    Past Medical History:  Diagnosis Date   Allergy    Anemia    "as teenager"   Arthritis    hands, hip   Back pain    BPPV (benign paroxysmal positional vertigo)    History of   Cancer ([place])    "melanoma removed from leg years ago", malignant mole   Chronic back pain    Complication of anesthesia    woke up and was confused and combative after back surgery   Constipation    DDD (degenerative disc disease), lumbar    Diabetes mellitus without complication ([place])    Diabetic neuropathy ([place])  ED (erectile dysfunction)    Essential hypertension 10/16/2015   Fatty liver    First degree AV block    EKG 12/03/16   GERD (gastroesophageal reflux disease)    diet controlled, no meds currently, history of   H/O hiatal hernia    History of elevated PSA    Hypertension    sees Dr. Virgina Jock   Nocturia more than twice per night 12/21/2013   OA (osteoarthritis)    Obesity hypoventilation syndrome ([place]) 04/07/2014   Obesity, morbid ([place]) 12/21/2013   OSA on CPAP 04/07/2014   Pure hypercholesterolemia 04/18/2021   RBBB    previous EKG   Sleep apnea    uses CPAP nightly    Snoring 12/21/2013   Spondylosis 2014   lumbar back, noted on MR    Ulcer    stomach ulcer "as teenager"   Past Surgical History:  Procedure Laterality Date   BACK SURGERY     "done approx. 24 years ago"   CARDIOVASCULAR STRESS TEST     "done approx 10 years ago, Dr. Virgina Jock"   COLONOSCOPY  2006   hx of "with removal of polyps"/[place]   COLONOSCOPY WITH PROPOFOL N/A 09/06/2015   Procedure: COLONOSCOPY WITH PROPOFOL;  Surgeon: Ladene Artist, MD;  [facility];  Service: Endoscopy;  Laterality: N/A;   lipoma removed     from leg   LUMBAR LAMINECTOMY/DECOMPRESSION MICRODISCECTOMY  06/19/2012   Procedure: LUMBAR LAMINECTOMY/DECOMPRESSION MICRODISCECTOMY 1 LEVEL;  Surgeon: Melina Schools, MD;  Location: [place];  Service: Orthopedics;  Laterality: Left;  L2-3 LEFT MICRODISCECTOMY   thigh surgery     fatty deposit   TONSILLECTOMY     TOTAL HIP ARTHROPLASTY Right 06/05/2017   Procedure: RIGHT TOTAL HIP ARTHROPLASTY ANTERIOR APPROACH;  Surgeon: Gaynelle Arabian, MD;  [facility];  Service: Orthopedics;  Laterality: Right;  Dr. requested 120 minutes   WISDOM TOOTH EXTRACTION     x 1 tooth     Current Meds  Medication Sig   ACCU-CHEK AVIVA PLUS test strip    Accu-Chek Softclix Lancets lancets    amLODipine (NORVASC) 10 MG tablet Take 10 mg by mouth every morning.    aspirin EC 81 MG tablet Take 81 mg by mouth daily.   doxazosin (CARDURA) 2 MG tablet TAKE 1 TABLET BY MOUTH 2 TIMES DAILY. TO BE TAKEN WITH THE 4 MG TABLETS.   doxazosin (CARDURA) 4 MG tablet TAKE 1 TABLET BY MOUTH 2 TIMES DAILY.   FIBER PO Take 3 tablets by mouth every morning.    furosemide (LASIX) 40 MG tablet Take 40 mg by mouth 2 (two) times a week.   gabapentin (NEURONTIN) 100 MG capsule gabapentin 100 mg capsule   Glucosamine-Chondroit-Vit C-Mn (GLUCOSAMINE 1500 COMPLEX PO) Take by mouth.   INVOKANA 100 MG TABS tablet    ipratropium (ATROVENT) 0.03 % nasal spray Place 1 spray into both nostrils at bedtime as  needed for rhinitis.    metFORMIN (GLUCOPHAGE) 500 MG tablet Take 500 mg by mouth daily with breakfast.    mometasone (NASONEX) 50 MCG/ACT nasal spray Place 2 sprays into the nose daily.   montelukast (SINGULAIR) 10 MG tablet Take 10 mg by mouth every morning.   Multiple Vitamin (MULTIVITAMIN) capsule Take 1 capsule by mouth daily.   naproxen (NAPROSYN) 500 MG tablet Take 500 mg by mouth as needed.   Nebivolol HCl 20 MG TABS Take 1 tablet by mouth daily.   rosuvastatin (CRESTOR) 5 MG tablet rosuvastatin 5 mg tablet  tirzepatide (MOUNJARO) 5 MG/0.5ML Pen Mounjaro 5 mg/0.5 mL subcutaneous pen injector  INJECT 5MG  SUBCUTANEOUSLY ONCE A WEEK   valsartan-hydrochlorothiazide (DIOVAN-HCT) 320-25 MG per tablet Take 1 tablet by mouth every morning.     Allergies:   Micardis [telmisartan]   Social History   Tobacco Use   Smoking status: Never   Smokeless tobacco: Never  Vaping Use   Vaping Use: Never used  Substance Use Topics   Alcohol use: No    Alcohol/week: 0.0 standard drinks   Drug use: No     Family Hx: The patient's family history includes Breast cancer in his sister; Colon polyps in his mother; Dementia in his mother; Heart attack in his sister; Heart failure in his father; Thyroid disease in his sister. There is no history of Colon cancer, Esophageal cancer, Rectal cancer, or Stomach cancer.  ROS:   Please see the history of present illness.    All other systems reviewed and are negative.   Prior CV studies:   The following studies were reviewed today:  Echo 09/01/2020:  1. Left ventricular ejection fraction, by estimation, is 60 to 65%. The left ventricle has normal function. The left ventricle has no regional wall motion abnormalities. The left ventricular internal cavity size was mildly dilated. There is moderate concentric left ventricular hypertrophy. Left ventricular diastolic parameters are consistent with Grade I diastolic dysfunction (impaired relaxation).   2.  Right ventricular systolic function is normal. The right ventricular size is normal. Tricuspid regurgitation signal is inadequate for assessing PA pressure.   3. The mitral valve is normal in structure. Trivial mitral valve regurgitation.   4. The aortic valve is tricuspid. There is mild calcification of the aortic valve. There is mild thickening of the aortic valve. Aortic valve regurgitation is not visualized. Mild aortic valve sclerosis is present, with no evidence of aortic valve stenosis.   5. Aortic dilatation noted. There is mild dilatation of the aortic root, measuring 41 mm. There is moderate dilatation of the ascending aorta, measuring 45 mm.   6. Consider CTA or MRA of the aorta to better assess aortic dilation due to suboptimal echo windows.    Echo 11/03/15: Study Conclusions   - Procedure narrative: Transthoracic echocardiography. Image   quality was suboptimal. The study was technically difficult.   Intravenous contrast (Definity) was administered. - Left ventricle: The cavity size was mildly dilated. Wall   thickness was increased in a pattern of mild LVH. Systolic   function was normal. The estimated ejection fraction was in the   range of 55% to 60%. Wall motion was normal; there were no   regional wall motion abnormalities. Acoustic contrast   opacification revealed no evidence ofthrombus. - Left atrium: The atrium was mildly dilated. - Right ventricle: The cavity size was mildly dilated.     Lexiscan Myoview 11/03/15: Nuclear stress EF: 44%. There was no ST segment deviation noted during stress. No T wave inversion was noted during stress. Defect 1: There is a small defect of moderate severity present in the apex location. Apical thinning vs prior infarct. This is an intermediate risk study due to reduced LVEF. The left ventricular ejection fraction is moderately decreased (30-44%). No ischemia.Echo 11/03/15: Study Conclusions   - Procedure narrative: Transthoracic  echocardiography. Image   quality was suboptimal. The study was technically difficult.   Intravenous contrast (Definity) was administered. - Left ventricle: The cavity size was mildly dilated. Wall   thickness was increased in a pattern of mild LVH. Systolic  function was normal. The estimated ejection fraction was in the   range of 55% to 60%. Wall motion was normal; there were no regional wall motion abnormalities. Acoustic contrast opacification revealed no evidence ofthrombus. - Left atrium: The atrium was mildly dilated. - Right ventricle: The cavity size was mildly dilated.     Lexiscan Myoview 11/03/15: Nuclear stress EF: 44%. There was no ST segment deviation noted during stress. No T wave inversion was noted during stress. Defect 1: There is a small defect of moderate severity present in the apex location. Apical thinning vs prior infarct. This is an intermediate risk study due to reduced LVEF. The left ventricular ejection fraction is moderately decreased (30-44%). No ischemia.  Labs/Other Tests and Data Reviewed:    EKG:    04/18/21: Sinus rhythm. Rate: 64 bpm.  RBBB First degree AV block. LAFB  Recent Labs: No results found for requested labs within last 8760 hours.   Recent Lipid Panel No results found for: CHOL, TRIG, HDL, CHOLHDL, LDLCALC, LDLDIRECT  Wt Readings from Last 3 Encounters:  04/18/21 (!) 354 lb (160.6 kg)  03/24/21 (!) 360 lb (163.3 kg)  12/21/20 (!) 371 lb (168.3 kg)     Objective:    VS:  BP 120/64 (BP Location: Left Arm, Patient Position: Sitting)   Pulse 64   Ht 6\' 1"  (1.854 m)   Wt (!) 354 lb (160.6 kg)   BMI 46.70 kg/m  , BMI Body mass index is 46.7 kg/m. GENERAL:  Well appearing HEENT: Pupils equal round and reactive, fundi not visualized, oral mucosa unremarkable NECK:  No jugular venous distention, waveform within normal limits, carotid upstroke brisk and symmetric, no bruits, no thyromegaly LUNGS:  Clear to auscultation  bilaterally HEART:  RRR.  PMI not displaced or sustained,S1 and S2 within normal limits, no S3, no S4, no clicks, no rubs, no murmurs ABD:  Flat, positive bowel sounds normal in frequency in pitch, no bruits, no rebound, no guarding, no midline pulsatile mass, no hepatomegaly, no splenomegaly EXT:  2 plus pulses throughout, no cyanosis no clubbing SKIN: Chronic stasis dermatitis.  NEURO:  Cranial nerves II through XII grossly intact, motor grossly intact throughout PSYCH:  Cognitively intact, oriented to person place and time   ASSESSMENT & PLAN:    Essential hypertension Blood pressure was initially elevated but within normal limits on repeat.  It has been very well-controlled at home.  At times it has been a little low.  We will continue his current regimen of amlodipine, doxazosin, furosemide, nebivolol, valsartan, and HCTZ.  Continue to encourage increased exercise as tolerated.  OSA on CPAP Continue CPAP.  Obesity, morbid He is losing weight with the help of his new diabetes medicine.  Continue to work on portion size and healthy eating options.  His ability to exercise is limited by orthopedic issues.  Pure hypercholesterolemia Lipids are very well-controlled on low-dose rosuvastatin.  He is mostly on this due to diabetes.    Medication Adjustments/Labs and Tests Ordered: Current medicines are reviewed at length with the patient today.  Concerns regarding medicines are outlined above.   Tests Ordered: Orders Placed This Encounter  Procedures   EKG 12-Lead     Medication Changes: No orders of the defined types were placed in this encounter.   Disposition:  Follow up in 1 year(s)  Signed, Skeet Latch, MD  04/18/2021 9:24 AM    [place]

## 2021-04-19 ENCOUNTER — Ambulatory Visit (AMBULATORY_SURGERY_CENTER): Payer: Medicare PPO | Admitting: Gastroenterology

## 2021-04-19 ENCOUNTER — Encounter: Payer: Self-pay | Admitting: Gastroenterology

## 2021-04-19 VITALS — BP 109/67 | HR 63 | Temp 95.7°F | Resp 14 | Ht 73.0 in | Wt 360.0 lb

## 2021-04-19 DIAGNOSIS — I10 Essential (primary) hypertension: Secondary | ICD-10-CM | POA: Diagnosis not present

## 2021-04-19 DIAGNOSIS — D125 Benign neoplasm of sigmoid colon: Secondary | ICD-10-CM | POA: Diagnosis not present

## 2021-04-19 DIAGNOSIS — R195 Other fecal abnormalities: Secondary | ICD-10-CM

## 2021-04-19 DIAGNOSIS — K633 Ulcer of intestine: Secondary | ICD-10-CM

## 2021-04-19 DIAGNOSIS — D122 Benign neoplasm of ascending colon: Secondary | ICD-10-CM | POA: Diagnosis not present

## 2021-04-19 DIAGNOSIS — E119 Type 2 diabetes mellitus without complications: Secondary | ICD-10-CM | POA: Diagnosis not present

## 2021-04-19 DIAGNOSIS — G4733 Obstructive sleep apnea (adult) (pediatric): Secondary | ICD-10-CM | POA: Diagnosis not present

## 2021-04-19 DIAGNOSIS — Z86010 Personal history of colonic polyps: Secondary | ICD-10-CM

## 2021-04-19 DIAGNOSIS — K529 Noninfective gastroenteritis and colitis, unspecified: Secondary | ICD-10-CM | POA: Diagnosis not present

## 2021-04-19 DIAGNOSIS — K635 Polyp of colon: Secondary | ICD-10-CM | POA: Diagnosis not present

## 2021-04-19 MED ORDER — SODIUM CHLORIDE 0.9 % IV SOLN: 500.0000 mL | Freq: Once | INTRAVENOUS | Status: DC

## 2021-04-19 NOTE — Op Note (Signed)
[facility] Patient Name: Tommy [name] Procedure Date: 04/19/2021 11:23 AM MRN: [id number] Endoscopist: Ladene Artist , MD Age: 75 Referring MD:  Date of Birth: February 21, 1946 Gender: Male Account #: [id number] Procedure:                Colonoscopy Indications:              Positive fecal immunochemical test, Personal                            history of adenomatous colon polyps. Medicines:                Monitored Anesthesia Care Procedure:                Pre-Anesthesia Assessment:                           - Prior to the procedure, a History and Physical                            was performed, and patient medications and                            allergies were reviewed. The patient's tolerance of                            previous anesthesia was also reviewed. The risks                            and benefits of the procedure and the sedation                            options and risks were discussed with the patient.                            All questions were answered, and informed consent                            was obtained. Prior Anticoagulants: The patient has                            taken no previous anticoagulant or antiplatelet                            agents. ASA Grade Assessment: III - A patient with                            severe systemic disease. After reviewing the risks                            and benefits, the patient was deemed in                            satisfactory condition to undergo the procedure.  After obtaining informed consent, the colonoscope                            was passed under direct vision. Throughout the                            procedure, the patient's blood pressure, pulse, and                            oxygen saturations were monitored continuously. The                            Olympus CF-HQ190L ([id number]) Colonoscope was                            introduced through the  anus and advanced to the the                            cecum, identified by appendiceal orifice and                            ileocecal valve. The ileocecal valve, appendiceal                            orifice, and rectum were photographed. The quality                            of the bowel preparation was adequate after                            extensive lavage, suction. The colonoscopy was                            somewhat difficult due to an elongated colon,                            significant looping, a tortuous colon and the                            patient's body habitus. The patient tolerated the                            procedure well. Scope In: 11:27:13 AM Scope Out: 11:55:51 AM Scope Withdrawal Time: 0 hours 21 minutes 7 seconds  Total Procedure Duration: 0 hours 28 minutes 38 seconds  Findings:                 The perianal and digital rectal examinations were                            normal.                           A few four mm ulcers were found at the ileocecal  valve. No bleeding was present. No stigmata of                            recent bleeding were seen. Biopsies were taken with                            a cold forceps for histology.                           A 9 mm polyp was found in the ascending colon. The                            polyp was sessile. The polyp was removed with a                            cold snare. Resection and retrieval were complete.                           A 4 mm polyp was found in the sigmoid colon. The                            polyp was sessile. The polyp was removed with a                            cold snare. Resection and retrieval were complete.                           Multiple medium-mouthed diverticula were found in                            the left colon. There was evidence of diverticular                            spasm. There was evidence of an impacted                             diverticulum. There was no evidence of diverticular                            bleeding.                           Internal hemorrhoids were found during                            retroflexion. The hemorrhoids were small and Grade                            I (internal hemorrhoids that do not prolapse).                           The exam was otherwise without abnormality on  direct and retroflexion views. Complications:            No immediate complications. Estimated blood loss:                            None. Estimated Blood Loss:     Estimated blood loss: none. Impression:               - A few ulcers at the ileocecal valve. Biopsied.                           - One 9 mm polyp in the ascending colon, removed                            with a cold snare. Resected and retrieved.                           - One 4 mm polyp in the sigmoid colon, removed with                            a cold snare. Resected and retrieved.                           - Moderate diverticulosis in the left colon.                           - Internal hemorrhoids.                           - The examination was otherwise normal on direct                            and retroflexion views. Recommendation:           - Patient has a contact number available for                            emergencies. The signs and symptoms of potential                            delayed complications were discussed with the                            patient. Return to normal activities tomorrow.                            Written discharge instructions were provided to the                            patient.                           - High fiber diet long term.                           - Continue present medications.                           -  Await pathology results.                           - No repeat colonoscopy due to age. Ladene Artist, MD 04/19/2021 12:03:55 PM This report has been  signed electronically.

## 2021-04-19 NOTE — Patient Instructions (Addendum)
Handouts given on polyps, hemorrhoids and diverticulosis  YOU HAD AN ENDOSCOPIC PROCEDURE TODAY: Refer to the procedure report and other information in the discharge instructions given to you for any specific questions about what was found during the examination. If this information does not answer your questions, please call [facility] office at [phone] to clarify.   YOU SHOULD EXPECT: Some feelings of bloating in the abdomen. Passage of more gas than usual. Walking can help get rid of the air that was put into your GI tract during the procedure and reduce the bloating. If you had a lower endoscopy (such as a colonoscopy or flexible sigmoidoscopy) you may notice spotting of blood in your stool or on the toilet paper. Some abdominal soreness may be present for a day or two, also.  DIET: Your first meal following the procedure should be a light meal and then it is ok to progress to your normal diet. A half-sandwich or bowl of soup is an example of a good first meal. Heavy or fried foods are harder to digest and may make you feel nauseous or bloated. Drink plenty of fluids but you should avoid alcoholic beverages for 24 hours. If you had a esophageal dilation, please see attached instructions for diet.    ACTIVITY: Your care partner should take you home directly after the procedure. You should plan to take it easy, moving slowly for the rest of the day. You can resume normal activity the day after the procedure however YOU SHOULD NOT DRIVE, use power tools, machinery or perform tasks that involve climbing or major physical exertion for 24 hours (because of the sedation medicines used during the test).   SYMPTOMS TO REPORT IMMEDIATELY: A gastroenterologist can be reached at any hour. Please call [phone]  for any of the following symptoms:  Following lower endoscopy (colonoscopy, flexible sigmoidoscopy) Excessive amounts of blood in the stool  Significant tenderness, worsening of abdominal pains   Swelling of the abdomen that is new, acute  Fever of 100 or higher  FOLLOW UP:  If any biopsies were taken you will be contacted by phone or by letter within the next 1-3 weeks. Call [phone]  if you have not heard about the biopsies in 3 weeks.  Please also call with any specific questions about appointments or follow up tests.

## 2021-04-19 NOTE — Progress Notes (Signed)
VS taken by [facility] 

## 2021-04-19 NOTE — Progress Notes (Signed)
See 03/24/2021 H&P, no changea.

## 2021-04-19 NOTE — Progress Notes (Signed)
Called to room to assist during endoscopic procedure.  Patient ID and intended procedure confirmed with present staff. Received instructions for my participation in the procedure from the performing physician.  

## 2021-04-19 NOTE — Progress Notes (Signed)
Report to PACU, RN, vss, BBS= Clear.  

## 2021-04-20 ENCOUNTER — Telehealth: Payer: Self-pay | Admitting: *Deleted

## 2021-04-20 NOTE — Telephone Encounter (Signed)
  Follow up Call-  Call back number 04/19/2021  Post procedure Call Back phone  # [phone]  Permission to leave phone message Yes  Some recent data might be hidden     Patient questions:  Do you have a fever, pain , or abdominal swelling? No. Pain Score  0 *  Have you tolerated food without any problems? Yes.    Have you been able to return to your normal activities? Yes.    Do you have any questions about your discharge instructions: Diet   No. Medications  No. Follow up visit  No.  Do you have questions or concerns about your Care? No.  Actions: * If pain score is 4 or above: No action needed, pain <4.  Have you developed a fever since your procedure? no  2.   Have you had an respiratory symptoms (SOB or cough) since your procedure? no  3.   Have you tested positive for COVID 19 since your procedure no  4.   Have you had any family members/close contacts diagnosed with the COVID 19 since your procedure?  no   If yes to any of these questions please route to Joylene John, RN and Joella Prince, RN

## 2021-04-24 DIAGNOSIS — M25562 Pain in left knee: Secondary | ICD-10-CM | POA: Diagnosis not present

## 2021-04-24 DIAGNOSIS — M25561 Pain in right knee: Secondary | ICD-10-CM | POA: Diagnosis not present

## 2021-05-02 DIAGNOSIS — M25561 Pain in right knee: Secondary | ICD-10-CM | POA: Diagnosis not present

## 2021-05-03 ENCOUNTER — Other Ambulatory Visit: Payer: Self-pay | Admitting: Cardiovascular Disease

## 2021-05-05 DIAGNOSIS — M25561 Pain in right knee: Secondary | ICD-10-CM | POA: Diagnosis not present

## 2021-05-08 ENCOUNTER — Other Ambulatory Visit: Payer: Self-pay | Admitting: Cardiovascular Disease

## 2021-05-09 DIAGNOSIS — M25561 Pain in right knee: Secondary | ICD-10-CM | POA: Diagnosis not present

## 2021-05-10 DIAGNOSIS — E1140 Type 2 diabetes mellitus with diabetic neuropathy, unspecified: Secondary | ICD-10-CM | POA: Diagnosis not present

## 2021-05-10 DIAGNOSIS — I10 Essential (primary) hypertension: Secondary | ICD-10-CM | POA: Diagnosis not present

## 2021-05-15 ENCOUNTER — Encounter: Payer: Self-pay | Admitting: Gastroenterology

## 2021-05-16 DIAGNOSIS — M25561 Pain in right knee: Secondary | ICD-10-CM | POA: Diagnosis not present

## 2021-06-08 DIAGNOSIS — M5416 Radiculopathy, lumbar region: Secondary | ICD-10-CM | POA: Diagnosis not present

## 2021-06-13 DIAGNOSIS — M1711 Unilateral primary osteoarthritis, right knee: Secondary | ICD-10-CM | POA: Diagnosis not present

## 2021-06-13 DIAGNOSIS — M1712 Unilateral primary osteoarthritis, left knee: Secondary | ICD-10-CM | POA: Diagnosis not present

## 2021-06-13 DIAGNOSIS — Z6841 Body Mass Index (BMI) 40.0 and over, adult: Secondary | ICD-10-CM | POA: Diagnosis not present

## 2021-06-19 ENCOUNTER — Other Ambulatory Visit: Payer: Self-pay | Admitting: Cardiovascular Disease

## 2021-06-19 DIAGNOSIS — M961 Postlaminectomy syndrome, not elsewhere classified: Secondary | ICD-10-CM | POA: Diagnosis not present

## 2021-06-19 DIAGNOSIS — M5416 Radiculopathy, lumbar region: Secondary | ICD-10-CM | POA: Diagnosis not present

## 2021-06-29 DIAGNOSIS — L821 Other seborrheic keratosis: Secondary | ICD-10-CM | POA: Diagnosis not present

## 2021-06-29 DIAGNOSIS — I5189 Other ill-defined heart diseases: Secondary | ICD-10-CM | POA: Diagnosis not present

## 2021-06-29 DIAGNOSIS — D224 Melanocytic nevi of scalp and neck: Secondary | ICD-10-CM | POA: Diagnosis not present

## 2021-06-29 DIAGNOSIS — D225 Melanocytic nevi of trunk: Secondary | ICD-10-CM | POA: Diagnosis not present

## 2021-06-29 DIAGNOSIS — D1801 Hemangioma of skin and subcutaneous tissue: Secondary | ICD-10-CM | POA: Diagnosis not present

## 2021-06-29 DIAGNOSIS — R0601 Orthopnea: Secondary | ICD-10-CM | POA: Diagnosis not present

## 2021-06-29 DIAGNOSIS — E662 Morbid (severe) obesity with alveolar hypoventilation: Secondary | ICD-10-CM | POA: Diagnosis not present

## 2021-06-29 DIAGNOSIS — I10 Essential (primary) hypertension: Secondary | ICD-10-CM | POA: Diagnosis not present

## 2021-06-29 DIAGNOSIS — I77810 Thoracic aortic ectasia: Secondary | ICD-10-CM | POA: Diagnosis not present

## 2021-06-29 DIAGNOSIS — E1165 Type 2 diabetes mellitus with hyperglycemia: Secondary | ICD-10-CM | POA: Diagnosis not present

## 2021-06-29 DIAGNOSIS — E1140 Type 2 diabetes mellitus with diabetic neuropathy, unspecified: Secondary | ICD-10-CM | POA: Diagnosis not present

## 2021-06-29 DIAGNOSIS — L918 Other hypertrophic disorders of the skin: Secondary | ICD-10-CM | POA: Diagnosis not present

## 2021-06-29 DIAGNOSIS — L720 Epidermal cyst: Secondary | ICD-10-CM | POA: Diagnosis not present

## 2021-06-29 DIAGNOSIS — K760 Fatty (change of) liver, not elsewhere classified: Secondary | ICD-10-CM | POA: Diagnosis not present

## 2021-06-29 DIAGNOSIS — Z85820 Personal history of malignant melanoma of skin: Secondary | ICD-10-CM | POA: Diagnosis not present

## 2021-07-04 DIAGNOSIS — M961 Postlaminectomy syndrome, not elsewhere classified: Secondary | ICD-10-CM | POA: Diagnosis not present

## 2021-07-11 DIAGNOSIS — M1712 Unilateral primary osteoarthritis, left knee: Secondary | ICD-10-CM | POA: Diagnosis not present

## 2021-07-11 DIAGNOSIS — Z6841 Body Mass Index (BMI) 40.0 and over, adult: Secondary | ICD-10-CM | POA: Diagnosis not present

## 2021-07-11 DIAGNOSIS — M1711 Unilateral primary osteoarthritis, right knee: Secondary | ICD-10-CM | POA: Diagnosis not present

## 2021-07-27 DIAGNOSIS — M5416 Radiculopathy, lumbar region: Secondary | ICD-10-CM | POA: Diagnosis not present

## 2021-08-09 DIAGNOSIS — E786 Lipoprotein deficiency: Secondary | ICD-10-CM | POA: Diagnosis not present

## 2021-08-09 DIAGNOSIS — E1140 Type 2 diabetes mellitus with diabetic neuropathy, unspecified: Secondary | ICD-10-CM | POA: Diagnosis not present

## 2021-08-09 DIAGNOSIS — E662 Morbid (severe) obesity with alveolar hypoventilation: Secondary | ICD-10-CM | POA: Diagnosis not present

## 2021-08-09 DIAGNOSIS — I10 Essential (primary) hypertension: Secondary | ICD-10-CM | POA: Diagnosis not present

## 2021-08-11 DIAGNOSIS — M961 Postlaminectomy syndrome, not elsewhere classified: Secondary | ICD-10-CM | POA: Diagnosis not present

## 2021-08-11 DIAGNOSIS — M5416 Radiculopathy, lumbar region: Secondary | ICD-10-CM | POA: Diagnosis not present

## 2021-09-11 DIAGNOSIS — M1711 Unilateral primary osteoarthritis, right knee: Secondary | ICD-10-CM | POA: Diagnosis not present

## 2021-09-11 DIAGNOSIS — Z6841 Body Mass Index (BMI) 40.0 and over, adult: Secondary | ICD-10-CM | POA: Diagnosis not present

## 2021-10-03 DIAGNOSIS — E1140 Type 2 diabetes mellitus with diabetic neuropathy, unspecified: Secondary | ICD-10-CM | POA: Diagnosis not present

## 2021-10-03 DIAGNOSIS — M1990 Unspecified osteoarthritis, unspecified site: Secondary | ICD-10-CM | POA: Diagnosis not present

## 2021-10-03 DIAGNOSIS — E662 Morbid (severe) obesity with alveolar hypoventilation: Secondary | ICD-10-CM | POA: Diagnosis not present

## 2021-10-03 DIAGNOSIS — E1165 Type 2 diabetes mellitus with hyperglycemia: Secondary | ICD-10-CM | POA: Diagnosis not present

## 2021-10-03 DIAGNOSIS — I10 Essential (primary) hypertension: Secondary | ICD-10-CM | POA: Diagnosis not present

## 2021-10-03 DIAGNOSIS — G4733 Obstructive sleep apnea (adult) (pediatric): Secondary | ICD-10-CM | POA: Diagnosis not present

## 2021-10-03 DIAGNOSIS — K760 Fatty (change of) liver, not elsewhere classified: Secondary | ICD-10-CM | POA: Diagnosis not present

## 2021-10-03 DIAGNOSIS — G629 Polyneuropathy, unspecified: Secondary | ICD-10-CM | POA: Diagnosis not present

## 2021-10-09 DIAGNOSIS — M1712 Unilateral primary osteoarthritis, left knee: Secondary | ICD-10-CM | POA: Diagnosis not present

## 2021-10-09 DIAGNOSIS — M1711 Unilateral primary osteoarthritis, right knee: Secondary | ICD-10-CM | POA: Diagnosis not present

## 2021-10-09 DIAGNOSIS — Z6841 Body Mass Index (BMI) 40.0 and over, adult: Secondary | ICD-10-CM | POA: Diagnosis not present

## 2021-10-17 ENCOUNTER — Other Ambulatory Visit: Payer: Self-pay | Admitting: Cardiovascular Disease

## 2021-11-06 DIAGNOSIS — H26493 Other secondary cataract, bilateral: Secondary | ICD-10-CM | POA: Diagnosis not present

## 2021-11-06 DIAGNOSIS — E119 Type 2 diabetes mellitus without complications: Secondary | ICD-10-CM | POA: Diagnosis not present

## 2021-11-06 DIAGNOSIS — H43813 Vitreous degeneration, bilateral: Secondary | ICD-10-CM | POA: Diagnosis not present

## 2021-11-06 DIAGNOSIS — H524 Presbyopia: Secondary | ICD-10-CM | POA: Diagnosis not present

## 2021-11-28 DIAGNOSIS — H26491 Other secondary cataract, right eye: Secondary | ICD-10-CM | POA: Diagnosis not present

## 2021-12-12 DIAGNOSIS — M1711 Unilateral primary osteoarthritis, right knee: Secondary | ICD-10-CM | POA: Diagnosis not present

## 2021-12-26 ENCOUNTER — Encounter: Payer: Self-pay | Admitting: Family Medicine

## 2021-12-26 ENCOUNTER — Ambulatory Visit: Payer: Medicare PPO | Admitting: Family Medicine

## 2021-12-26 VITALS — BP 132/81 | HR 61 | Ht 73.0 in | Wt 340.0 lb

## 2021-12-26 DIAGNOSIS — G4733 Obstructive sleep apnea (adult) (pediatric): Secondary | ICD-10-CM | POA: Diagnosis not present

## 2021-12-26 DIAGNOSIS — Z9989 Dependence on other enabling machines and devices: Secondary | ICD-10-CM | POA: Diagnosis not present

## 2021-12-26 NOTE — Progress Notes (Signed)
PATIENT: Tommy [name] DOB: [birth year]  REASON FOR VISIT: follow up HISTORY FROM: patient  Chief Complaint  Patient presents with   Follow-up    Pt in 4 alone  Pt here for CPAP f/u Pt states CPAP is doing better now Pt states no questions or concerns for this visit  pt states Nerve block in back yearly and injection of steroids  in knees every 3 months     HISTORY OF PRESENT ILLNESS:  12/26/2021 ALL: Tommy [name] returns for follow up for OSA on CPAP. He reports doing very well on therapy. He is having a harder time sleeping due to bilateral knee and back pain. He tosses and turns all night. He may sleep 6 hours a night. He is preparing for knee replacement. He is getting nerve blocks and knee injections that have helped a little. He has lost 36lbs. He is on  [facility] and planning to increase dose soon.   Compliance report over the past 30 days shows excellent compliance. 100% with daily and 4 hour usage. Residual AHI 0.9/hr. Leak is slightly elevated at 21l/min.   12/21/2020 ALL: Tommy [name] returns for follow up for OSA on CPAP. He received new CPAP machine 09/2020. He is doing well. He feels that he tolerates new machine much better. He is now using [facility] for DME. He does have some congestion in the mornings. Using Nasonex and Atrovent nasal spray.     09/23/2019 ALL: Tommy [name] is a 76 y.o. male here today for follow up for OSA on CPAP.  He is doing very well today.  He continues CPAP therapy nightly and for greater than 4 hours each night.  He reports he cannot sleep without using CPAP.  He has noted that his machine does not power on at times.  He has to press the power button several times before it will start.  His machine is approximately 76 years old.  He would like to consider getting a new CPAP machine.  Otherwise he is doing very well and has no concerns.  Compliance report dated 08/23/2019 through 09/21/2019 reveals that he is used CPAP therapy every night for  compliance of 100%.  He has used CPAP 4 hours every night.  Average usage was 6 hours and 33 minutes.  Residual AHI was 3.0 on 10 to 16 cm of water and an EPR of 3.  There was no significant leak noted.  HISTORY: (copied from my note on 10/12/2018)  Tommy [name] is a 76 y.o. male for follow up for OSA on CPAP. He continues to do well with therapy and has no concerns today.    09/15/2018 - 10/14/2018  - 10/14/2018 Usage days 30/30 days (100%) >= 4 hours 30 days (100%) < 4 hours 0 days (0%) Usage hours 203 hours 26 minutes Average usage (total days) 6 hours 47 minutes Average usage (days used) 6 hours 47 minutes Median usage (days used) 6 hours 48 minutes Total used hours (value since last reset - 10/14/2018) 11,335 hours AirSense 10 AutoSet Serial number [id number] Mode AutoSet Min Pressure 10 cmH2O Max Pressure 16 cmH2O EPR Fulltime EPR level 3 Therapy Pressure - cmH2O Median: 11.0 95th percentile: 12.5 Maximum: 13.6 Leaks - L/min Median: 3.4 95th percentile: 12.1 Maximum: 16.4 Events per hour AI: 2.2 HI: 0.6 AHI: 2.8 Apnea Index Central: 0.8 Obstructive: 1.3 Unknown: 0.0 RERA Index 0.0 Cheyne-Stokes respiration (average duration per night) 10 minutes (3%)   History (copied from Dr Edwena [name] note on  10/10/2017)    Interval history from 10 Oct 2017, I have the pleasure of meeting with Tommy [name]. N[place]on today who underwent a replacement surgery under the guidance of Tommy [name].  His right hip is now healed recovered and he is fairly mobile.  He reports that in the last 6 weeks he has done more project in the whole year before.  In order to get ready for surgery and limit his surgical risk he was able to lose weight but he has already gained back 20 pounds.  But it was previously hip pain that interrupted and fragmented his sleep it is now nocturia.  He states that he has no longer blocks of 4 interrupted interrupted hours of sleep but only about 2.   He has been as  in the years before 100% compliant with CPAP he is using an AutoSet between 10 and 16 cmH2O with 3 cm EPR and this window covers his current pressure needs.  His pressure at the 95th percentile is 11.9 cmH2O so well was in the window.  Residual apnea and hypercapnia index AHI was 1.5/h and they were 3 minutes or 1% of the night of Cheyne-Stokes respirations. He takes a muscle relaxant at night.  DME is [facility]-mask type nasal pillow, air fit P 10  Epworth 5 points, not bad. FSS at 35, geriatric depression score is 2/15. He sleeps so much better with CPAP, noted the difference when his home lost power.    Fam history: Brother has now OSA with CPAP.  Patient reports vivid dreams in AM - not enacting them.    Interval history from 10/10/2016 area at the pleasure of seeing Tommy [name] today, who is diabetic, hypertensive and was diagnosed with obstructive sleep apnea in 2015. His diabetes is currently treated with invokana- this causes some nocturia. He is compliant with his CPAP use, uses an AutoSet between 10 and 16 cm water with 3 cm EPR achieving a residual AHI of 1.2 apneas per hour. His 100% compliant by days and hours of use was an average user time of 6 hours and 50 minutes. He is using an air sense machine by [employer] and nasal pillows. No adjustments have to be made on this side. He still uses CPAP without ramp function and with a high starting pressure. I will repeat an ONO to see if he still needs oxygen. He likes to travel and oxygen need complicates things. He has hip pain, awaiting a consultation with Dr. Maureen [name].   His wife is on CPAP, too.    HISTORY OF PRESENT ILLNESS: Tommy [name] is a 76 y.o.caucasian , right handed  male , who is seen here as a referral from Dr. Virgina [name] for sleep evaluation.  The patient  Is a retired Designer, jewellery, Music therapist. He has sleep problems of nocturia, snoring and GERD in sleep, EDS . He also has a history of osteoarthritis allergic rhinitis, GERD,  hypertension, fatty liver disease. He has a family history of heart disease or hypertension. Tommy. Passe reports that she had back surgery ( laminectomy in  level L4-L5  ) and had reported to his orthopedists that he was excessively daytime sleepy and had trouble staying awake when driving longer distances. Dr. Rolena Infante asked him to make his primary care physician aware of this problem.   Prior to his back surgery in 2014 the patient was used to sleep in a recliner mainly because he could not find a comfortable position in bed. Since his back  surgery this has improved tremendously and he is now sleeping in a regular bed. He has aches and pain when exercising, gained weight even after back surgery, he gained 60 pounds before surgery in only 3 years. He is frequently short of breath when just speaking, not exercising.He has a sister with heart disease, a brother with obstructive sleep apnea ,  mother but was diagnosed as obstructive sleep apnea and his father is deceased with congestive heart failure.    Interval history 04-07-14 CD Tommy. Zoll underwent a split night polysomnography on 02-01-14 after endorsing the Epworth Sleepiness Scale at 16 points and presenting with a neck circumference of 20 inches and a BMI of 48.5. The patient's AHI was 43.1 he did not have any REM sleep, he did not sleep supine for these 2 hours of diagnostic study - his CO2 increased to 54.44. His oxygen nadir was 84% for 43.1 minutes the patient was titrated and interestingly his oxygen saturations actually decreased further 164 minutes of desaturations were found during the CPAP titration with a nadir at 71% [name]. And he had nocturia several times at night. He was fitted with an O2 pressure window between 10 and 16 cm water. We also recommended possible oxygen therapy should his hypoxemia continue on CPAP. Today is the patient's first visit after sleep study and he has noted some  remarkable differences for example his Epworth Sleepiness Scale is now 10 and his fatigue severity score 41 points. The first 30 days of use he struggled with his CPAP and mostly slept in a recliner. He now has adjusted quite well at 30 day download dated 04-05-16 chills 5 hours and 33 minutes of daily use 100% compliance of full-time EPR level of 3 cm water and a pressure of 12.9 cm water at the 91st percentile. His residual AHI is 1.3. There were no Cheyne-Stokes respirations noted. An adjustment of settings is not necessary but the patient wishes to not have a ramp function that. He has likely to use some additional oxygen. ONO showed on CPAP low oxygenation.  He has taken off the RAMP function and reduced the humidity.   He has not fallen asleep in church since being on CPAP.    REVIEW OF SYSTEMS: Out of a complete 14 system review of symptoms, the patient complains only of the following symptoms, morning congestion, chronic hip pain, and all other reviewed systems are negative.  ESS: 13/24, previously 5/24  ALLERGIES: Allergies  Allergen Reactions   Micardis [Telmisartan] Hives and Rash    HOME MEDICATIONS: Outpatient Medications Prior to Visit  Medication Sig Dispense Refill   ACCU-CHEK AVIVA PLUS test strip      Accu-Chek Softclix Lancets lancets      amLODipine (NORVASC) 10 MG tablet Take 10 mg by mouth every morning.      aspirin EC 81 MG tablet Take 81 mg by mouth daily.     doxazosin (CARDURA) 2 MG tablet TAKE 1 TABLET BY MOUTH 2 TIMES DAILY. TO BE TAKEN WITH THE 4 MG TABLETS. 180 tablet 3   doxazosin (CARDURA) 4 MG tablet TAKE 1 TABLET BY MOUTH 2 TIMES DAILY. 180 tablet 3   FIBER PO Take 2 tablets by mouth every morning.     furosemide (LASIX) 40 MG tablet Take 40 mg by mouth 2 (two) times a week.     gabapentin (NEURONTIN) 100 MG capsule Take 300 mg by mouth 2 (two) times daily.  Glucosamine-Chondroit-Vit C-Mn (GLUCOSAMINE 1500 COMPLEX PO) Take by mouth.     INVOKANA 100  MG TABS tablet      ipratropium (ATROVENT) 0.03 % nasal spray Place 1 spray into both nostrils at bedtime as needed for rhinitis.      metFORMIN (GLUCOPHAGE) 500 MG tablet Take 500 mg by mouth daily with breakfast.      mometasone (NASONEX) 50 MCG/ACT nasal spray Place 2 sprays into the nose daily.     montelukast (SINGULAIR) 10 MG tablet Take 10 mg by mouth every morning.     Multiple Vitamin (MULTIVITAMIN) capsule Take 1 capsule by mouth daily.     naproxen (NAPROSYN) 500 MG tablet Take 500 mg by mouth 2 (two) times daily with a meal.     Nebivolol HCl 20 MG TABS TAKE 1 TABLET BY MOUTH DAILY. 90 tablet 1   rosuvastatin (CRESTOR) 5 MG tablet rosuvastatin 5 mg tablet     tirzepatide (MOUNJARO) 5 MG/0.5ML Pen Inject 10 mg into the skin once a week.     valsartan-hydrochlorothiazide (DIOVAN-HCT) 320-25 MG per tablet Take 1 tablet by mouth every morning.     No facility-administered medications prior to visit.    PAST MEDICAL HISTORY: Past Medical History:  Diagnosis Date   Allergy    Anemia    "as teenager"   Arthritis    hands, hip   Back pain    BPPV (benign paroxysmal positional vertigo)    History of   Cancer ([place])    "melanoma removed from leg years ago", malignant mole   Cataract    Chronic back pain    Complication of anesthesia    woke up and was confused and combative after back surgery   Constipation    DDD (degenerative disc disease), lumbar    Diabetes mellitus without complication ([place])    Diabetic neuropathy ([place])    ED (erectile dysfunction)    Essential hypertension 10/16/2015   Fatty liver    First degree AV block    EKG 12/03/16   GERD (gastroesophageal reflux disease)    diet controlled, no meds currently, history of   H/O hiatal hernia    History of elevated PSA    Hypertension    sees Dr. Virgina [name]   Nocturia more than twice per night 12/21/2013   OA (osteoarthritis)    Obesity hypoventilation syndrome ([place]) 04/07/2014   Obesity, morbid ([place]) 12/21/2013    OSA on CPAP 04/07/2014   Pure hypercholesterolemia 04/18/2021   RBBB    previous EKG   Sleep apnea    uses CPAP nightly   Snoring 12/21/2013   Spondylosis 2014   lumbar back, noted on Tommy    Ulcer    stomach ulcer "as teenager"    PAST SURGICAL HISTORY: Past Surgical History:  Procedure Laterality Date   BACK SURGERY     "done approx. 24 years ago"   CARDIOVASCULAR STRESS TEST     "done approx 10 years ago, Dr. Virgina [name]"   COLONOSCOPY  2006   hx of "with removal of polyps"/[facility]   COLONOSCOPY WITH PROPOFOL N/A 09/06/2015   Procedure: COLONOSCOPY WITH PROPOFOL;  Surgeon: Ladene Artist, MD;  Location: [facility];  Service: Endoscopy;  Laterality: N/A;   lipoma removed     from leg   LUMBAR LAMINECTOMY/DECOMPRESSION MICRODISCECTOMY  06/19/2012   Procedure: LUMBAR LAMINECTOMY/DECOMPRESSION MICRODISCECTOMY 1 LEVEL;  Surgeon: Melina Schools, MD;  Location: [facility];  Service: Orthopedics;  Laterality: Left;  L2-3 LEFT MICRODISCECTOMY   thigh  surgery     fatty deposit   TONSILLECTOMY     TOTAL HIP ARTHROPLASTY Right 06/05/2017   Procedure: RIGHT TOTAL HIP ARTHROPLASTY ANTERIOR APPROACH;  Surgeon: Gaynelle Arabian, MD;  Location: [facility];  Service: Orthopedics;  Laterality: Right;  Dr. requested 120 minutes   WISDOM TOOTH EXTRACTION     x 1 tooth    FAMILY HISTORY: Family History  Problem Relation Age of Onset   Dementia Mother    Colon polyps Mother    Heart failure Father    Breast cancer Sister    Heart attack Sister    Thyroid disease Sister    Sleep apnea Brother    Colon cancer Neg Hx    Rectal cancer Neg Hx    Stomach cancer Neg Hx    Esophageal cancer Neg Hx     SOCIAL HISTORY: Social History   Socioeconomic History   Marital status: Married    Spouse name: [name]   Number of children: 2   Years of education: Masters   Highest education level: Not on file  Occupational History    Employer: RETIRED  Tobacco Use   Smoking status: Never   Smokeless  tobacco: Never  Vaping Use   Vaping Use: Never used  Substance and Sexual Activity   Alcohol use: No    Alcohol/week: 0.0 standard drinks of alcohol   Drug use: No   Sexual activity: Not on file  Other Topics Concern   Not on file  Social History Narrative   Patient is married Estate manager/land agent) and lives at home with his wife.   Patient has two adult children.   Patient is retired.   Patient has a Master's degree.   Patient is right-handed   Patient does not drink any caffeine.   Social Determinants of Health   Financial Resource Strain: Not on file  Food Insecurity: Not on file  Transportation Needs: Not on file  Physical Activity: Not on file  Stress: Not on file  Social Connections: Not on file  Intimate Partner Violence: Not on file      PHYSICAL EXAM  Vitals:   12/26/21 0940  BP: 132/81  Pulse: 61  Weight: (!) 340 lb (154.2 kg)  Height: '6\' 1"'$  (1.854 m)     Body mass index is 44.86 kg/m.  Generalized: Well developed, in no acute distress  Cardiology: normal rate and rhythm, no murmur noted Respiratory: clear to auscultation bilaterally  Neurological examination  Mentation: Alert oriented to time, place, history taking. Follows all commands speech and language fluent Cranial nerve II-XII: Pupils were equal round reactive to light. Extraocular movements were full, visual field were full  Motor: The motor testing reveals 5 over 5 strength of all 4 extremities. Good symmetric motor tone is noted throughout.  Gait and station: Gait is arthritic but stable without assistive device.   DIAGNOSTIC DATA (LABS, IMAGING, TESTING) - I reviewed patient records, labs, notes, testing and imaging myself where available.      No data to display           Lab Results  Component Value Date   WBC 17.2 (H) 06/06/2017   HGB 13.9 06/06/2017   HCT 41.8 06/06/2017   MCV 90.3 06/06/2017   PLT 248 06/06/2017      Component Value Date/Time   NA 139 06/06/2017 0601   K 3.6  06/06/2017 0601   CL 101 06/06/2017 0601   CO2 28 06/06/2017 0601   GLUCOSE 155 (H) 06/06/2017 0601  BUN 15 06/06/2017 0601   CREATININE 0.72 06/06/2017 0601   CALCIUM 9.0 06/06/2017 0601   PROT 7.4 05/30/2017 0902   ALBUMIN 4.2 05/30/2017 0902   AST 21 05/30/2017 0902   ALT 17 05/30/2017 0902   ALKPHOS 115 05/30/2017 0902   BILITOT 1.2 05/30/2017 0902   GFRNONAA >60 06/06/2017 0601   GFRAA >60 06/06/2017 0601   No results found for: "CHOL", "HDL", "LDLCALC", "LDLDIRECT", "TRIG", "CHOLHDL" Lab Results  Component Value Date   HGBA1C 5.2 05/30/2017   No results found for: "VITAMINB12" No results found for: "TSH"     ASSESSMENT AND PLAN 76 y.o. year old male  has a past medical history of Allergy, Anemia, Arthritis, Back pain, BPPV (benign paroxysmal positional vertigo), Cancer ([facility]), Cataract, Chronic back pain, Complication of anesthesia, Constipation, DDD (degenerative disc disease), lumbar, Diabetes mellitus without complication ([place]), Diabetic neuropathy ([place]), ED (erectile dysfunction), Essential hypertension (10/16/2015), Fatty liver, First degree AV block, GERD (gastroesophageal reflux disease), H/O hiatal hernia, History of elevated PSA, Hypertension, Nocturia more than twice per night (12/21/2013), OA (osteoarthritis), Obesity hypoventilation syndrome ([place]) (04/07/2014), Obesity, morbid ([place]) (12/21/2013), OSA on CPAP (04/07/2014), Pure hypercholesterolemia (04/18/2021), RBBB, Sleep apnea, Snoring (12/21/2013), Spondylosis (2014), and Ulcer. here with     ICD-10-CM   1. OSA on CPAP  G47.33 For home use only DME continuous positive airway pressure (CPAP)   Z99.89       Julious is doing well with his new CPAP machine.  Compliance report reveals excellent compliance.  He was encouraged to continue using CPAP nightly and for greater than 4 hours each night. ESS was elevated, today, but I suspect pain is contributing to sleep concerns. He will monitor closely. Continue working  toward [facility] goals. Healthy lifestyle habits with well-balanced diet regular exercise advised.  He will return to see me in 1 year.  He verbalizes understanding and agreement with this plan.   Orders Placed This Encounter  Procedures   For home use only DME continuous positive airway pressure (CPAP)    Supplies    Order Specific Question:   Length of Need    Answer:   Lifetime    Order Specific Question:   Patient has OSA or probable OSA    Answer:   Yes    Order Specific Question:   Is the patient currently using CPAP in the home    Answer:   Yes    Order Specific Question:   Settings    Answer:   Other see comments    Order Specific Question:   CPAP supplies needed    Answer:   Mask, headgear, cushions, filters, heated tubing and water chamber      No orders of the defined types were placed in this encounter.       Debbora Presto, FNP-C 12/26/2021, 10:05 AM [facility] [address], [place], [place] [address] [phone]

## 2021-12-26 NOTE — Patient Instructions (Signed)

## 2021-12-26 NOTE — Progress Notes (Signed)
CM sent to [facility] for new order ?

## 2022-01-03 DIAGNOSIS — E786 Lipoprotein deficiency: Secondary | ICD-10-CM | POA: Diagnosis not present

## 2022-01-03 DIAGNOSIS — E1140 Type 2 diabetes mellitus with diabetic neuropathy, unspecified: Secondary | ICD-10-CM | POA: Diagnosis not present

## 2022-01-03 DIAGNOSIS — I10 Essential (primary) hypertension: Secondary | ICD-10-CM | POA: Diagnosis not present

## 2022-01-03 DIAGNOSIS — E662 Morbid (severe) obesity with alveolar hypoventilation: Secondary | ICD-10-CM | POA: Diagnosis not present

## 2022-01-15 DIAGNOSIS — M1712 Unilateral primary osteoarthritis, left knee: Secondary | ICD-10-CM | POA: Diagnosis not present

## 2022-01-15 DIAGNOSIS — M1711 Unilateral primary osteoarthritis, right knee: Secondary | ICD-10-CM | POA: Diagnosis not present

## 2022-02-06 DIAGNOSIS — Z125 Encounter for screening for malignant neoplasm of prostate: Secondary | ICD-10-CM | POA: Diagnosis not present

## 2022-02-06 DIAGNOSIS — I10 Essential (primary) hypertension: Secondary | ICD-10-CM | POA: Diagnosis not present

## 2022-02-06 DIAGNOSIS — E786 Lipoprotein deficiency: Secondary | ICD-10-CM | POA: Diagnosis not present

## 2022-02-06 DIAGNOSIS — R739 Hyperglycemia, unspecified: Secondary | ICD-10-CM | POA: Diagnosis not present

## 2022-02-06 DIAGNOSIS — E1140 Type 2 diabetes mellitus with diabetic neuropathy, unspecified: Secondary | ICD-10-CM | POA: Diagnosis not present

## 2022-02-06 DIAGNOSIS — R7989 Other specified abnormal findings of blood chemistry: Secondary | ICD-10-CM | POA: Diagnosis not present

## 2022-02-13 DIAGNOSIS — M1990 Unspecified osteoarthritis, unspecified site: Secondary | ICD-10-CM | POA: Diagnosis not present

## 2022-02-13 DIAGNOSIS — E1165 Type 2 diabetes mellitus with hyperglycemia: Secondary | ICD-10-CM | POA: Diagnosis not present

## 2022-02-13 DIAGNOSIS — E1140 Type 2 diabetes mellitus with diabetic neuropathy, unspecified: Secondary | ICD-10-CM | POA: Diagnosis not present

## 2022-02-13 DIAGNOSIS — G629 Polyneuropathy, unspecified: Secondary | ICD-10-CM | POA: Diagnosis not present

## 2022-02-13 DIAGNOSIS — R82998 Other abnormal findings in urine: Secondary | ICD-10-CM | POA: Diagnosis not present

## 2022-02-13 DIAGNOSIS — G4733 Obstructive sleep apnea (adult) (pediatric): Secondary | ICD-10-CM | POA: Diagnosis not present

## 2022-02-13 DIAGNOSIS — I10 Essential (primary) hypertension: Secondary | ICD-10-CM | POA: Diagnosis not present

## 2022-02-13 DIAGNOSIS — Z0000 Encounter for general adult medical examination without abnormal findings: Secondary | ICD-10-CM | POA: Diagnosis not present

## 2022-02-13 DIAGNOSIS — E786 Lipoprotein deficiency: Secondary | ICD-10-CM | POA: Diagnosis not present

## 2022-03-19 DIAGNOSIS — M1712 Unilateral primary osteoarthritis, left knee: Secondary | ICD-10-CM | POA: Diagnosis not present

## 2022-03-19 DIAGNOSIS — M1711 Unilateral primary osteoarthritis, right knee: Secondary | ICD-10-CM | POA: Diagnosis not present

## 2022-03-22 DIAGNOSIS — E119 Type 2 diabetes mellitus without complications: Secondary | ICD-10-CM | POA: Diagnosis not present

## 2022-03-22 DIAGNOSIS — D3131 Benign neoplasm of right choroid: Secondary | ICD-10-CM | POA: Diagnosis not present

## 2022-03-22 DIAGNOSIS — H5203 Hypermetropia, bilateral: Secondary | ICD-10-CM | POA: Diagnosis not present

## 2022-03-22 DIAGNOSIS — H524 Presbyopia: Secondary | ICD-10-CM | POA: Diagnosis not present

## 2022-04-13 ENCOUNTER — Other Ambulatory Visit: Payer: Self-pay | Admitting: Cardiovascular Disease

## 2022-04-16 DIAGNOSIS — M1712 Unilateral primary osteoarthritis, left knee: Secondary | ICD-10-CM | POA: Diagnosis not present

## 2022-04-16 NOTE — Telephone Encounter (Signed)
Rx request sent to pharmacy.  

## 2022-04-23 ENCOUNTER — Ambulatory Visit (HOSPITAL_BASED_OUTPATIENT_CLINIC_OR_DEPARTMENT_OTHER): Payer: Medicare PPO | Admitting: Cardiovascular Disease

## 2022-04-23 ENCOUNTER — Encounter (HOSPITAL_BASED_OUTPATIENT_CLINIC_OR_DEPARTMENT_OTHER): Payer: Self-pay | Admitting: Cardiovascular Disease

## 2022-04-23 VITALS — BP 132/76 | HR 64

## 2022-04-23 DIAGNOSIS — R0602 Shortness of breath: Secondary | ICD-10-CM

## 2022-04-23 DIAGNOSIS — E7800 Pure hypercholesterolemia, unspecified: Secondary | ICD-10-CM

## 2022-04-23 DIAGNOSIS — I7121 Aneurysm of the ascending aorta, without rupture: Secondary | ICD-10-CM | POA: Diagnosis not present

## 2022-04-23 DIAGNOSIS — I5032 Chronic diastolic (congestive) heart failure: Secondary | ICD-10-CM | POA: Diagnosis not present

## 2022-04-23 DIAGNOSIS — I10 Essential (primary) hypertension: Secondary | ICD-10-CM | POA: Diagnosis not present

## 2022-04-23 DIAGNOSIS — E662 Morbid (severe) obesity with alveolar hypoventilation: Secondary | ICD-10-CM | POA: Diagnosis not present

## 2022-04-23 HISTORY — DX: Chronic diastolic (congestive) heart failure: I50.32

## 2022-04-23 HISTORY — DX: Aneurysm of the ascending aorta, without rupture: I71.21

## 2022-04-23 NOTE — Assessment & Plan Note (Signed)
Dr. Virgina Jock checks his lipids regularly.  He reports that they have been at goal.  Continue rosuvastatin.

## 2022-04-23 NOTE — Progress Notes (Signed)
Cardiology Office Note   Date:  04/23/2022   ID:  Tommy [name], DOB November 18, 1945, MRN [id number]  PCP:  Shon Baton, MD  Cardiologist:  Skeet Latch, MD  Electrophysiologist:  None  Evaluation Performed:  Follow-Up Visit  Chief Complaint:  hypertension  History of Present Illness:    Tommy [name] is a 76 y.o. male with asthma, hypertension, GERD, fatty liver disease, OSA on CPAP and morbid obesity who presents for follow up on shortness of breath. Tommy [name] saw his PCP, Dr. Shon Baton, on 09/20/15. At that appointment it was noted that a chest x-ray in January 2014 showed a tortuous aorta and calcifications of the thoracic aorta. He also reported dyspnea on exertion. He was referred to cardiology for further evaluation.  He was referred for a Lexiscan Myoview 10/2015 that was negative for ischemia but the ejection fraction was reported as 30-44%. He subsequently had an echocardiogram that revealed normal systolic function and mild LVH.  Diastolic function was abnormal but the degree of dysfunction was not commented upon and the echo is not currently available for review.   Tommy [name]'s BP remained elevated so doxazosin was increased to '6mg'$  bid.  At his last appointment, he had chronic shortness of breath which was due to obesity and deconditioning. He was not interested in making any drastic interventions. He saw Dr Virgina Jock and continued to report shortness of breath. He had an echo 4/22 that revealed LVEF 60-65% ad grade 1 diastolic dysfunction. His ascending aorta was 4.5 cm which was unchanged from 2017.  At the last visit, Tommy [name] was doing well and was working on lifestyle changes. He has been doing well overall.  His only complaint is that he has been experiencing bilateral knee pain due to his osteoarthritis. His breathing has been doing well and reported that the CPAP machine has been going well. He noted that he has lost ~30lbs since he's been on the  [facility].  Lately he has had a plateau and struggles to lose more.  It helps with his appetite but he reports that he still eats more than he should.  He has been taking 3 cortisone injections a month on his knees and endorsed that he needs to lose at least ~30lbs so he can get his knee replacement surgery.    Past Medical History:  Diagnosis Date   Allergy    Anemia    "as teenager"   Arthritis    hands, hip   Ascending aortic aneurysm ([place]) 04/23/2022   Back pain    BPPV (benign paroxysmal positional vertigo)    History of   Cancer (HCC)    "melanoma removed from leg years ago", malignant mole   Cataract    Chronic back pain    Chronic diastolic heart failure ([place]) 09/98/3382   Complication of anesthesia    woke up and was confused and combative after back surgery   Constipation    DDD (degenerative disc disease), lumbar    Diabetes mellitus without complication ([place])    Diabetic neuropathy ([place])    ED (erectile dysfunction)    Essential hypertension 10/16/2015   Fatty liver    First degree AV block    EKG 12/03/16   GERD (gastroesophageal reflux disease)    diet controlled, no meds currently, history of   H/O hiatal hernia    History of elevated PSA    Nocturia more than twice per night 12/21/2013   OA (osteoarthritis)    Obesity hypoventilation  syndrome ([place]) 04/07/2014   Obesity, morbid ([place]) 12/21/2013   OSA on CPAP 04/07/2014   Pure hypercholesterolemia 04/18/2021   RBBB    previous EKG   Sleep apnea    uses CPAP nightly   Snoring 12/21/2013   Spondylosis 2014   lumbar back, noted on MR    Ulcer    stomach ulcer "as teenager"   Past Surgical History:  Procedure Laterality Date   BACK SURGERY     "done approx. 24 years ago"   CARDIOVASCULAR STRESS TEST     "done approx 10 years ago, Dr. Virgina Jock"   COLONOSCOPY  2006   hx of "with removal of polyps"/[facility]   COLONOSCOPY WITH PROPOFOL N/A 09/06/2015   Procedure: COLONOSCOPY WITH PROPOFOL;  Surgeon: Ladene Artist, MD;  Location: [facility];  Service: Endoscopy;  Laterality: N/A;   lipoma removed     from leg   LUMBAR LAMINECTOMY/DECOMPRESSION MICRODISCECTOMY  06/19/2012   Procedure: LUMBAR LAMINECTOMY/DECOMPRESSION MICRODISCECTOMY 1 LEVEL;  Surgeon: Melina Schools, MD;  Location: [facility];  Service: Orthopedics;  Laterality: Left;  L2-3 LEFT MICRODISCECTOMY   thigh surgery     fatty deposit   TONSILLECTOMY     TOTAL HIP ARTHROPLASTY Right 06/05/2017   Procedure: RIGHT TOTAL HIP ARTHROPLASTY ANTERIOR APPROACH;  Surgeon: Gaynelle Arabian, MD;  Location: [facility];  Service: Orthopedics;  Laterality: Right;  Dr. requested 120 minutes   WISDOM TOOTH EXTRACTION     x 1 tooth     Current Meds  Medication Sig   ACCU-CHEK AVIVA PLUS test strip    Accu-Chek Softclix Lancets lancets    amLODipine (NORVASC) 10 MG tablet Take 10 mg by mouth every morning.    aspirin EC 81 MG tablet Take 81 mg by mouth daily.   doxazosin (CARDURA) 2 MG tablet TAKE 1 TABLET BY MOUTH 2 TIMES DAILY. TO BE TAKEN WITH THE 4 MG TABLETS.   doxazosin (CARDURA) 4 MG tablet TAKE 1 TABLET BY MOUTH 2 TIMES DAILY.   FIBER PO Take 2 tablets by mouth every morning.   furosemide (LASIX) 40 MG tablet Take 40 mg by mouth 2 (two) times a week.   gabapentin (NEURONTIN) 600 MG tablet Take 600 mg by mouth 2 (two) times daily.   Glucosamine-Chondroit-Vit C-Mn (GLUCOSAMINE 1500 COMPLEX PO) Take by mouth.   INVOKANA 100 MG TABS tablet    ipratropium (ATROVENT) 0.03 % nasal spray Place 1 spray into both nostrils at bedtime as needed for rhinitis.    metFORMIN (GLUCOPHAGE) 500 MG tablet Take 500 mg by mouth daily with breakfast.    mometasone (NASONEX) 50 MCG/ACT nasal spray Place 2 sprays into the nose daily.   montelukast (SINGULAIR) 10 MG tablet Take 10 mg by mouth every morning.   MOUNJARO 15 MG/0.5ML Pen Inject 15 mg into the skin once a week.   Multiple Vitamin (MULTIVITAMIN) capsule Take 1 capsule by mouth daily.   naproxen (NAPROSYN) 500 MG  tablet Take 500 mg by mouth 2 (two) times daily with a meal.   Nebivolol HCl 20 MG TABS Take 1 tablet (20 mg total) by mouth daily. Please keep your upcoming appointment for refills.   rosuvastatin (CRESTOR) 5 MG tablet rosuvastatin 5 mg tablet   valsartan-hydrochlorothiazide (DIOVAN-HCT) 320-25 MG per tablet Take 1 tablet by mouth every morning.     Allergies:   Micardis [telmisartan]   Social History   Tobacco Use   Smoking status: Never   Smokeless tobacco: Never  Vaping Use   Vaping  Use: Never used  Substance Use Topics   Alcohol use: No    Alcohol/week: 0.0 standard drinks of alcohol   Drug use: No     Family Hx: The patient's family history includes Breast cancer in his sister; Colon polyps in his mother; Dementia in his mother; Heart attack in his sister; Heart failure in his father; Sleep apnea in his brother; Thyroid disease in his sister. There is no history of Colon cancer, Rectal cancer, Stomach cancer, or Esophageal cancer.  ROS:   Please see the history of present illness.    (+) Bilateral knee pain All other systems reviewed and are negative.   Prior CV studies:   The following studies were reviewed today:  Echo 09/01/2020:  1. Left ventricular ejection fraction, by estimation, is 60 to 65%. The left ventricle has normal function. The left ventricle has no regional wall motion abnormalities. The left ventricular internal cavity size was mildly dilated. There is moderate concentric left ventricular hypertrophy. Left ventricular diastolic parameters are consistent with Grade I diastolic dysfunction (impaired relaxation).   2. Right ventricular systolic function is normal. The right ventricular size is normal. Tricuspid regurgitation signal is inadequate for assessing PA pressure.   3. The mitral valve is normal in structure. Trivial mitral valve regurgitation.   4. The aortic valve is tricuspid. There is mild calcification of the aortic valve. There is mild  thickening of the aortic valve. Aortic valve regurgitation is not visualized. Mild aortic valve sclerosis is present, with no evidence of aortic valve stenosis.   5. Aortic dilatation noted. There is mild dilatation of the aortic root, measuring 41 mm. There is moderate dilatation of the ascending aorta, measuring 45 mm.   6. Consider CTA or MRA of the aorta to better assess aortic dilation due to suboptimal echo windows.    Echo 11/03/15: Study Conclusions   - Procedure narrative: Transthoracic echocardiography. Image   quality was suboptimal. The study was technically difficult.   Intravenous contrast (Definity) was administered. - Left ventricle: The cavity size was mildly dilated. Wall   thickness was increased in a pattern of mild LVH. Systolic   function was normal. The estimated ejection fraction was in the   range of 55% to 60%. Wall motion was normal; there were no   regional wall motion abnormalities. Acoustic contrast   opacification revealed no evidence ofthrombus. - Left atrium: The atrium was mildly dilated. - Right ventricle: The cavity size was mildly dilated.     Lexiscan Myoview 11/03/15: Nuclear stress EF: 44%. There was no ST segment deviation noted during stress. No T wave inversion was noted during stress. Defect 1: There is a small defect of moderate severity present in the apex location. Apical thinning vs prior infarct. This is an intermediate risk study due to reduced LVEF. The left ventricular ejection fraction is moderately decreased (30-44%). No ischemia.Echo 11/03/15: Study Conclusions   - Procedure narrative: Transthoracic echocardiography. Image   quality was suboptimal. The study was technically difficult.   Intravenous contrast (Definity) was administered. - Left ventricle: The cavity size was mildly dilated. Wall   thickness was increased in a pattern of mild LVH. Systolic   function was normal. The estimated ejection fraction was in the   range of  55% to 60%. Wall motion was normal; there were no regional wall motion abnormalities. Acoustic contrast opacification revealed no evidence ofthrombus. - Left atrium: The atrium was mildly dilated. - Right ventricle: The cavity size was mildly dilated.  Lexiscan Myoview 11/03/15: Nuclear stress EF: 44%. There was no ST segment deviation noted during stress. No T wave inversion was noted during stress. Defect 1: There is a small defect of moderate severity present in the apex location. Apical thinning vs prior infarct. This is an intermediate risk study due to reduced LVEF. The left ventricular ejection fraction is moderately decreased (30-44%). No ischemia.  Labs/Other Tests and Data Reviewed:    EKG: 04/23/22: Sinus rhythm. Rate 64 bpm. RBBB, First degree AV block 04/18/21: Sinus rhythm. Rate: 64 bpm.  RBBB First degree AV block. LAFB  Recent Labs: No results found for requested labs within last 365 days.   Recent Lipid Panel No results found for: "CHOL", "TRIG", "HDL", "CHOLHDL", "LDLCALC", "LDLDIRECT"  Wt Readings from Last 3 Encounters:  12/26/21 (!) 340 lb (154.2 kg)  04/19/21 (!) 360 lb (163.3 kg)  04/18/21 (!) 354 lb (160.6 kg)     Objective:    VS:  BP 132/76 (BP Location: Left Arm, Patient Position: Sitting, Cuff Size: Large)   Pulse 64  , BMI There is no height or weight on file to calculate BMI. GENERAL:  Well appearing HEENT: Pupils equal round and reactive, fundi not visualized, oral mucosa unremarkable NECK:  No jugular venous distention, waveform within normal limits, carotid upstroke brisk and symmetric, no bruits, no thyromegaly LUNGS:  Clear to auscultation bilaterally HEART:  RRR.  PMI not displaced or sustained,S1 and S2 within normal limits, no S3, no S4, no clicks, no rubs, no murmurs ABD:  Flat, positive bowel sounds normal in frequency in pitch, no bruits, no rebound, no guarding, no midline pulsatile mass, no hepatomegaly, no splenomegaly EXT:  2  plus pulses throughout, no cyanosis no clubbing, 1+ bilateral LE edema SKIN: Chronic stasis dermatitis.  NEURO:  Cranial nerves II through XII grossly intact, motor grossly intact throughout PSYCH:  Cognitively intact, oriented to person place and time   ASSESSMENT & PLAN:    Essential hypertension Blood pressure is very well controlled at home.  It was higher initially in the office and did improve on repeat.  Given that his blood pressure is controlled at home we will not make any changes.  Continue nebivolol, amlodipine, valsartan and HCTZ.  BP averaging in the 110s-120s/60-70s.  He struggles with orthopedic issues that make it difficult for him to exercise.  Obesity hypoventilation syndrome He does very well with the CPAP machine.  Pure hypercholesterolemia Dr. Virgina Jock checks his lipids regularly.  He reports that they have been at goal.  Continue rosuvastatin.  Chronic diastolic heart failure ([place]) He continues to have mild lower extremity edema.  He notes that his breathing improved and his edema improved a little bit after he was started on Lasix.  We we will repeat his echocardiogram.  Check a BMP and a BNP today.  Blood pressure well controlled as above.  Continue furosemide.  Obesity, morbid Continue Mounjaro.  He continues to struggle with weight loss.  Difficulty with exercise.  He is tolerating Mounjaro well and notes that it has helped limit his appetite.  Ascending aortic aneurysm (HCC) Blood pressure well controlled and he is on a beta-blocker.  Repeating echo as above.   Medication Adjustments/Labs and Tests Ordered: Current medicines are reviewed at length with the patient today.  Concerns regarding medicines are outlined above.   Tests Ordered: Orders Placed This Encounter  Procedures   Basic metabolic panel   B Nat Peptide   EKG 12-Lead   ECHOCARDIOGRAM COMPLETE  Medication Changes: No orders of the defined types were placed in this  encounter.  Disposition:  Follow up in 33-month.  I,Danny Valdes,acting as a sEducation administratorfor TSkeet Latch MD.,have documented all relevant documentation on the behalf of TSkeet Latch MD,as directed by  TSkeet Latch MD while in the presence of TSkeet Latch MD.  Signed, TSkeet Latch MD  04/23/2022 11:38 AM    [place]

## 2022-04-23 NOTE — Assessment & Plan Note (Signed)
Continue Mounjaro.  He continues to struggle with weight loss.  Difficulty with exercise.  He is tolerating Mounjaro well and notes that it has helped limit his appetite.

## 2022-04-23 NOTE — Assessment & Plan Note (Signed)
Blood pressure well controlled and he is on a beta-blocker.  Repeating echo as above.

## 2022-04-23 NOTE — Assessment & Plan Note (Signed)
He does very well with the CPAP machine.

## 2022-04-23 NOTE — Assessment & Plan Note (Signed)
He continues to have mild lower extremity edema.  He notes that his breathing improved and his edema improved a little bit after he was started on Lasix.  We we will repeat his echocardiogram.  Check a BMP and a BNP today.  Blood pressure well controlled as above.  Continue furosemide.

## 2022-04-23 NOTE — Patient Instructions (Signed)
Medication Instructions:  Your physician recommends that you continue on your current medications as directed. Please refer to the Current Medication list given to you today.   *If you need a refill on your cardiac medications before your next appointment, please call your pharmacy*  Lab Work: BMET/BNP TODAY   If you have labs (blood work) drawn today and your tests are completely normal, you will receive your results only by: [place] (if you have MyChart) OR A paper copy in the mail If you have any lab test that is abnormal or we need to change your treatment, we will call you to review the results.  Testing/Procedures: Your physician has requested that you have an echocardiogram. Echocardiography is a painless test that uses sound waves to create images of your heart. It provides your doctor with information about the size and shape of your heart and how well your heart's chambers and valves are working. This procedure takes approximately one hour. There are no restrictions for this procedure. Please do NOT wear cologne, perfume, aftershave, or lotions (deodorant is allowed). Please arrive 15 minutes prior to your appointment time.    Follow-Up: At [facility], you and your health needs are our priority.  As part of our continuing mission to provide you with exceptional heart care, we have created designated Provider Care Teams.  These Care Teams include your primary Cardiologist (physician) and Advanced Practice Providers (APPs -  Physician Assistants and Nurse Practitioners) who all work together to provide you with the care you need, when you need it.  We recommend signing up for the patient portal called "MyChart".  Sign up information is provided on this After Visit Summary.  MyChart is used to connect with patients for Virtual Visits (Telemedicine).  Patients are able to view lab/test results, encounter notes, upcoming appointments, etc.  Non-urgent messages can be  sent to your provider as well.   To learn more about what you can do with MyChart, go to [email or web address].    Your next appointment:   6 month(s)  The format for your next appointment:   In Person  Provider:   Skeet Latch, MD

## 2022-04-23 NOTE — Assessment & Plan Note (Addendum)
Blood pressure is very well controlled at home.  It was higher initially in the office and did improve on repeat.  Given that his blood pressure is controlled at home we will not make any changes.  Continue nebivolol, amlodipine, valsartan and HCTZ.  BP averaging in the 110s-120s/60-70s.  He struggles with orthopedic issues that make it difficult for him to exercise.

## 2022-04-24 LAB — BASIC METABOLIC PANEL
BUN/Creatinine Ratio: 16 (ref 10–24)
BUN: 11 mg/dL (ref 8–27)
CO2: 24 mmol/L (ref 20–29)
Calcium: 9.1 mg/dL (ref 8.6–10.2)
Chloride: 101 mmol/L (ref 96–106)
Creatinine, Ser: 0.68 mg/dL — ABNORMAL LOW (ref 0.76–1.27)
Glucose: 101 mg/dL — ABNORMAL HIGH (ref 70–99)
Potassium: 3.7 mmol/L (ref 3.5–5.2)
Sodium: 141 mmol/L (ref 134–144)
eGFR: 96 mL/min/{1.73_m2} (ref >59)

## 2022-04-24 LAB — BRAIN NATRIURETIC PEPTIDE: BNP: 31.6 pg/mL (ref 0.0–100.0)

## 2022-04-28 ENCOUNTER — Other Ambulatory Visit: Payer: Self-pay | Admitting: Cardiovascular Disease

## 2022-04-30 NOTE — Telephone Encounter (Signed)
Rx(s) sent to pharmacy electronically.  

## 2022-05-10 ENCOUNTER — Ambulatory Visit (INDEPENDENT_AMBULATORY_CARE_PROVIDER_SITE_OTHER): Payer: Medicare PPO

## 2022-05-10 DIAGNOSIS — R0602 Shortness of breath: Secondary | ICD-10-CM

## 2022-05-10 DIAGNOSIS — I10 Essential (primary) hypertension: Secondary | ICD-10-CM | POA: Diagnosis not present

## 2022-05-10 DIAGNOSIS — I7121 Aneurysm of the ascending aorta, without rupture: Secondary | ICD-10-CM | POA: Diagnosis not present

## 2022-05-10 LAB — ECHOCARDIOGRAM COMPLETE
Area-P 1/2: 3.53 cm2
P 1/2 time: 452 msec
S' Lateral: 4.56 cm

## 2022-05-24 DIAGNOSIS — I10 Essential (primary) hypertension: Secondary | ICD-10-CM | POA: Diagnosis not present

## 2022-05-24 DIAGNOSIS — E786 Lipoprotein deficiency: Secondary | ICD-10-CM | POA: Diagnosis not present

## 2022-05-24 DIAGNOSIS — E662 Morbid (severe) obesity with alveolar hypoventilation: Secondary | ICD-10-CM | POA: Diagnosis not present

## 2022-05-24 DIAGNOSIS — E1140 Type 2 diabetes mellitus with diabetic neuropathy, unspecified: Secondary | ICD-10-CM | POA: Diagnosis not present

## 2022-06-06 ENCOUNTER — Encounter (HOSPITAL_BASED_OUTPATIENT_CLINIC_OR_DEPARTMENT_OTHER): Payer: Self-pay | Admitting: *Deleted

## 2022-06-06 ENCOUNTER — Telehealth (HOSPITAL_BASED_OUTPATIENT_CLINIC_OR_DEPARTMENT_OTHER): Payer: Self-pay | Admitting: *Deleted

## 2022-06-06 DIAGNOSIS — I7121 Aneurysm of the ascending aorta, without rupture: Secondary | ICD-10-CM

## 2022-06-06 DIAGNOSIS — I10 Essential (primary) hypertension: Secondary | ICD-10-CM

## 2022-06-06 DIAGNOSIS — Z01812 Encounter for preprocedural laboratory examination: Secondary | ICD-10-CM

## 2022-06-06 NOTE — Telephone Encounter (Signed)
-----  Message from Skeet Latch, MD sent at 06/05/2022  3:08 PM EST ----- Echo shows that his heart squeezes well but does not relax completely.  This is a mild change and will not cause symptoms unless it worsens.  It will be important to keep his blood pressure under good control. His aorta has gotten larger since it was last checked.  Please get a chest CT-A to look at the aorta.

## 2022-06-06 NOTE — Telephone Encounter (Signed)
Advised patient of Echo, order placed

## 2022-06-07 ENCOUNTER — Telehealth (HOSPITAL_BASED_OUTPATIENT_CLINIC_OR_DEPARTMENT_OTHER): Payer: Self-pay | Admitting: Cardiovascular Disease

## 2022-06-07 NOTE — Telephone Encounter (Signed)
Spoke with patient regarding the 06/21/22 8:00 am CTA chest/aorta appointment here at [facility]--arrival time is 7:45 am for check in--NPO after midnight.  Patient to have lab work completed Friday 06/15/22.  Patient voiced his understanding.

## 2022-06-12 ENCOUNTER — Other Ambulatory Visit: Payer: Self-pay | Admitting: Cardiovascular Disease

## 2022-06-12 NOTE — Telephone Encounter (Signed)
Rx(s) sent to pharmacy electronically.  

## 2022-06-15 DIAGNOSIS — Z01812 Encounter for preprocedural laboratory examination: Secondary | ICD-10-CM | POA: Diagnosis not present

## 2022-06-15 DIAGNOSIS — M961 Postlaminectomy syndrome, not elsewhere classified: Secondary | ICD-10-CM | POA: Diagnosis not present

## 2022-06-15 DIAGNOSIS — I10 Essential (primary) hypertension: Secondary | ICD-10-CM | POA: Diagnosis not present

## 2022-06-15 DIAGNOSIS — M5416 Radiculopathy, lumbar region: Secondary | ICD-10-CM | POA: Diagnosis not present

## 2022-06-15 LAB — BASIC METABOLIC PANEL
BUN/Creatinine Ratio: 22 (ref 10–24)
BUN: 16 mg/dL (ref 8–27)
CO2: 26 mmol/L (ref 20–29)
Calcium: 9.1 mg/dL (ref 8.6–10.2)
Chloride: 101 mmol/L (ref 96–106)
Creatinine, Ser: 0.73 mg/dL — ABNORMAL LOW (ref 0.76–1.27)
Glucose: 117 mg/dL — ABNORMAL HIGH (ref 70–99)
Potassium: 3.9 mmol/L (ref 3.5–5.2)
Sodium: 142 mmol/L (ref 134–144)
eGFR: 94 mL/min/{1.73_m2} (ref >59)

## 2022-06-18 DIAGNOSIS — M1711 Unilateral primary osteoarthritis, right knee: Secondary | ICD-10-CM | POA: Diagnosis not present

## 2022-06-19 DIAGNOSIS — I77810 Thoracic aortic ectasia: Secondary | ICD-10-CM | POA: Diagnosis not present

## 2022-06-19 DIAGNOSIS — M5416 Radiculopathy, lumbar region: Secondary | ICD-10-CM | POA: Diagnosis not present

## 2022-06-19 DIAGNOSIS — G4733 Obstructive sleep apnea (adult) (pediatric): Secondary | ICD-10-CM | POA: Diagnosis not present

## 2022-06-19 DIAGNOSIS — I10 Essential (primary) hypertension: Secondary | ICD-10-CM | POA: Diagnosis not present

## 2022-06-19 DIAGNOSIS — E662 Morbid (severe) obesity with alveolar hypoventilation: Secondary | ICD-10-CM | POA: Diagnosis not present

## 2022-06-19 DIAGNOSIS — E1140 Type 2 diabetes mellitus with diabetic neuropathy, unspecified: Secondary | ICD-10-CM | POA: Diagnosis not present

## 2022-06-19 DIAGNOSIS — G629 Polyneuropathy, unspecified: Secondary | ICD-10-CM | POA: Diagnosis not present

## 2022-06-19 DIAGNOSIS — E1165 Type 2 diabetes mellitus with hyperglycemia: Secondary | ICD-10-CM | POA: Diagnosis not present

## 2022-06-19 DIAGNOSIS — R0601 Orthopnea: Secondary | ICD-10-CM | POA: Diagnosis not present

## 2022-06-21 ENCOUNTER — Ambulatory Visit (HOSPITAL_BASED_OUTPATIENT_CLINIC_OR_DEPARTMENT_OTHER)
Admission: RE | Admit: 2022-06-21 | Discharge: 2022-06-21 | Disposition: A | Discharge status: Home / Self Care | Payer: Medicare PPO | Source: Ambulatory Visit | Attending: Cardiovascular Disease | Admitting: Cardiovascular Disease

## 2022-06-21 DIAGNOSIS — I7121 Aneurysm of the ascending aorta, without rupture: Secondary | ICD-10-CM | POA: Diagnosis not present

## 2022-06-21 MED ORDER — IOHEXOL 350 MG/ML SOLN
100.0000 mL | Freq: Once | INTRAVENOUS | Status: AC | PRN
  Administered 2022-06-21: 85 mL via INTRAVENOUS

## 2022-06-29 ENCOUNTER — Telehealth (HOSPITAL_BASED_OUTPATIENT_CLINIC_OR_DEPARTMENT_OTHER): Payer: Self-pay

## 2022-06-29 DIAGNOSIS — I7121 Aneurysm of the ascending aorta, without rupture: Secondary | ICD-10-CM

## 2022-06-29 NOTE — Telephone Encounter (Addendum)
Results called to patient who verbalizes understanding! Orders placed. Patient also notes his wife is seeing Dr. Oval Linsey Monday and will need preop clearance for back surgery. Instructed patient to have surgical request sent to our office, fax number given.    ----- Message from Loel Dubonnet, NP sent at 06/28/2022 10:07 PM EST ----- Ascending thoracic aorta measures 5.0 cm. Repeat CT aorta in 6 months. Recommend referral to cardiothoracic surgery   Indeterminate adrenal nodule likely adrenal adenoma, follow up with primary care regarding possible repeat adrenal washout CT in one year.

## 2022-07-03 DIAGNOSIS — G4733 Obstructive sleep apnea (adult) (pediatric): Secondary | ICD-10-CM | POA: Diagnosis not present

## 2022-07-03 DIAGNOSIS — M961 Postlaminectomy syndrome, not elsewhere classified: Secondary | ICD-10-CM | POA: Diagnosis not present

## 2022-07-03 DIAGNOSIS — M5416 Radiculopathy, lumbar region: Secondary | ICD-10-CM | POA: Diagnosis not present

## 2022-07-03 DIAGNOSIS — J45998 Other asthma: Secondary | ICD-10-CM | POA: Diagnosis not present

## 2022-07-12 ENCOUNTER — Institutional Professional Consult (permissible substitution): Payer: Medicare PPO | Admitting: Cardiothoracic Surgery

## 2022-07-12 VITALS — BP 154/70 | HR 64 | Resp 20 | Ht 73.0 in | Wt 335.0 lb

## 2022-07-12 DIAGNOSIS — I7121 Aneurysm of the ascending aorta, without rupture: Secondary | ICD-10-CM | POA: Diagnosis not present

## 2022-07-12 NOTE — Progress Notes (Signed)
[address]       [place] [address]             [phone]                    Malike D Bittick [place] Medical Record [id number] Date of Birth: Jun 01, 1945  Referring: Loel Dubonnet, NP Primary Care: Shon Baton, MD Primary Cardiologist: Skeet Latch, MD  Chief Complaint:    Chief Complaint  Patient presents with   Thoracic Aortic Aneurysm    Surgical consult, CTA Chest 06/21/22    History of Present Illness:    Tommy [name] 77 y.o. male who presents for evaluation of aortic aneurysm. It was incidentally identified. No sudden death or aortic aneurysms / dissection in the family.   His BP control is good - 125/70 this AM  Other family history includes: Dad w chf, was a Building control surveyor and it was thought from smoke inhalation Sister with heart issues, passed at 57 Mom was 95 No unexplained sudden death.   Needs to lose another 30lb to get the knee surgery  Per recent cardiology HPI Tommy [name] is a 77 y.o. male with asthma, hypertension, GERD, fatty liver disease, OSA on CPAP and morbid obesity who presents for follow up on shortness of breath. [name] saw his PCP, Dr. Shon Baton, on 09/20/15. At that appointment it was noted that a chest x-ray in January 2014 showed a tortuous aorta and calcifications of the thoracic aorta. He also reported dyspnea on exertion. He was referred to cardiology for further evaluation.  He was referred for a Lexiscan Myoview 10/2015 that was negative for ischemia but the ejection fraction was reported as 30-44%. He subsequently had an echocardiogram that revealed normal systolic function and mild LVH.  Diastolic function was abnormal but the degree of dysfunction was not commented upon and the echo is not currently available for review.    [name] BP remained elevated so doxazosin was increased to '6mg'$  bid.  At his last appointment, he had chronic shortness of breath which was due to  obesity and deconditioning. He was not interested in making any drastic interventions. He saw Dr Virgina Jock and continued to report shortness of breath. He had an echo 4/22 that revealed LVEF 60-65% ad grade 1 diastolic dysfunction. His ascending aorta was 4.5 cm which was unchanged from 2017.   At the last visit, [name] was doing well and was working on lifestyle changes. He has been doing well overall.  His only complaint is that he has been experiencing bilateral knee pain due to his osteoarthritis. His breathing has been doing well and reported that the CPAP machine has been going well. He noted that he has lost ~30lbs since he's been on the [facility].  Lately he has had a plateau and struggles to lose more.  It helps with his appetite but he reports that he still eats more than he should.  He has been taking 3 cortisone injections a month on his knees and endorsed that he needs to lose at least ~30lbs so he can get his knee replacement surgery.   Past Medical History:  Diagnosis Date   Allergy    Anemia    "as teenager"   Arthritis    hands, hip   Ascending aortic aneurysm ([place]) 04/23/2022   Back pain    BPPV (benign paroxysmal positional vertigo)    History of   Cancer (  [place])    "melanoma removed from leg years ago", malignant mole   Cataract    Chronic back pain    Chronic diastolic heart failure ([place]) [id number]   Complication of anesthesia    woke up and was confused and combative after back surgery   Constipation    DDD (degenerative disc disease), lumbar    Diabetes mellitus without complication ([place])    Diabetic neuropathy ([place])    ED (erectile dysfunction)    Essential hypertension 10/16/2015   Fatty liver    First degree AV block    EKG 12/03/16   GERD (gastroesophageal reflux disease)    diet controlled, no meds currently, history of   H/O hiatal hernia    History of elevated PSA    Nocturia more than twice per night 12/21/2013   OA (osteoarthritis)    Obesity  hypoventilation syndrome ([place]) 04/07/2014   Obesity, morbid ([place]) 12/21/2013   OSA on CPAP 04/07/2014   Pure hypercholesterolemia 04/18/2021   RBBB    previous EKG   Sleep apnea    uses CPAP nightly   Snoring 12/21/2013   Spondylosis 2014   lumbar back, noted on MR    Ulcer    stomach ulcer "as teenager"    Past Surgical History:  Procedure Laterality Date   BACK SURGERY     "done approx. 24 years ago"   CARDIOVASCULAR STRESS TEST     "done approx 10 years ago, Dr. Virgina Jock"   COLONOSCOPY  2006   hx of "with removal of polyps"/[facility]   COLONOSCOPY WITH PROPOFOL N/A 09/06/2015   Procedure: COLONOSCOPY WITH PROPOFOL;  Surgeon: Ladene Artist, MD;  Location: [facility];  Service: Endoscopy;  Laterality: N/A;   lipoma removed     from leg   LUMBAR LAMINECTOMY/DECOMPRESSION MICRODISCECTOMY  06/19/2012   Procedure: LUMBAR LAMINECTOMY/DECOMPRESSION MICRODISCECTOMY 1 LEVEL;  Surgeon: Melina Schools, MD;  Location: [facility];  Service: Orthopedics;  Laterality: Left;  L2-3 LEFT MICRODISCECTOMY   thigh surgery     fatty deposit   TONSILLECTOMY     TOTAL HIP ARTHROPLASTY Right 06/05/2017   Procedure: RIGHT TOTAL HIP ARTHROPLASTY ANTERIOR APPROACH;  Surgeon: Gaynelle Arabian, MD;  Location: [facility];  Service: Orthopedics;  Laterality: Right;  Dr. requested 120 minutes   WISDOM TOOTH EXTRACTION     x 1 tooth    Family History  Problem Relation Age of Onset   Dementia Mother    Colon polyps Mother    Heart failure Father    Breast cancer Sister    Heart attack Sister    Thyroid disease Sister    Sleep apnea Brother    Colon cancer Neg Hx    Rectal cancer Neg Hx    Stomach cancer Neg Hx    Esophageal cancer Neg Hx      Social History   Tobacco Use  Smoking Status Never  Smokeless Tobacco Never    Social History   Substance and Sexual Activity  Alcohol Use No   Alcohol/week: 0.0 standard drinks of alcohol     Allergies  Allergen Reactions   Linaclotide Other (See  Comments)   Micardis [Telmisartan] Hives and Rash    Current Outpatient Medications  Medication Sig Dispense Refill   ACCU-CHEK AVIVA PLUS test strip      Accu-Chek Softclix Lancets lancets      amLODipine (NORVASC) 10 MG tablet Take 10 mg by mouth every morning.      aspirin EC 81 MG tablet Take 81  mg by mouth daily.     doxazosin (CARDURA) 2 MG tablet TAKE 1 TABLET BY MOUTH 2 TIMES DAILY. TO BE TAKEN WITH THE 4 MG TABLETS. 180 tablet 2   doxazosin (CARDURA) 4 MG tablet TAKE 1 TABLET BY MOUTH 2 TIMES DAILY. 180 tablet 3   FIBER PO Take 2 tablets by mouth every morning.     furosemide (LASIX) 40 MG tablet Take 40 mg by mouth 2 (two) times a week.     gabapentin (NEURONTIN) 600 MG tablet Take 600 mg by mouth 2 (two) times daily.     Glucosamine-Chondroit-Vit C-Mn (GLUCOSAMINE 1500 COMPLEX PO) Take by mouth.     INVOKANA 100 MG TABS tablet      ipratropium (ATROVENT) 0.03 % nasal spray Place 1 spray into both nostrils at bedtime as needed for rhinitis.      metFORMIN (GLUCOPHAGE) 500 MG tablet Take 500 mg by mouth daily with breakfast.      mometasone (NASONEX) 50 MCG/ACT nasal spray Place 2 sprays into the nose daily.     montelukast (SINGULAIR) 10 MG tablet Take 10 mg by mouth every morning.     MOUNJARO 15 MG/0.5ML Pen Inject 15 mg into the skin once a week.     Multiple Vitamin (MULTIVITAMIN) capsule Take 1 capsule by mouth daily.     naproxen (NAPROSYN) 500 MG tablet Take 500 mg by mouth 2 (two) times daily with a meal.     rosuvastatin (CRESTOR) 5 MG tablet rosuvastatin 5 mg tablet     valsartan-hydrochlorothiazide (DIOVAN-HCT) 320-25 MG per tablet Take 1 tablet by mouth every morning.     Nebivolol HCl 20 MG TABS Take 1 tablet (20 mg total) by mouth daily. 90 tablet 3   No current facility-administered medications for this visit.    ROS 14 point ROS reviewed and negative except as per HPI   PHYSICAL EXAMINATION: BP (!) 154/70   Pulse 64   Resp 20   Ht '6\' 1"'$  (1.854 m)   Wt  (!) 335 lb (152 kg)   SpO2 93% Comment: RA  BMI 44.20 kg/m   Gen: NAD Neuro: Alert and oriented CV regular Resp: Nonlaboured Abd: Soft, ntnd Extr: WWP  Diagnostic Studies & Laboratory data:     Recent Radiology Findings:   No results found.     I have independently reviewed the above radiology studies  and reviewed the findings with the patient.   Recent Lab Findings: Lab Results  Component Value Date   WBC 17.2 (H) 06/06/2017   HGB 13.9 06/06/2017   HCT 41.8 06/06/2017   PLT 248 06/06/2017   GLUCOSE 117 (H) 06/15/2022   ALT 17 05/30/2017   AST 21 05/30/2017   NA 142 06/15/2022   K 3.9 06/15/2022   CL 101 06/15/2022   CREATININE 0.73 (L) 06/15/2022   BUN 16 06/15/2022   CO2 26 06/15/2022   INR 1.07 05/30/2017   HGBA1C 5.2 05/30/2017    Aortic Root:   --Valve: 3.1 x 3.1 cm   --Sinuses: 4.5 x 4.1 x 4.1 cm   --Sinotubular Junction: 4.4 x 3.5 cm   Limitations by motion: Moderate   Thoracic Aorta:   --Ascending Aorta: 5.0 x 4.9 cm   --Aortic Arch: 4.7 x 4.4 cm   --Descending Aorta: 4.0 x 3.9 cm  Assessment / Plan:   77 year old obese man presents with incidentally identified aortic aneurysm. The ascending is almost 5.0cm. He is continuing to lose wieght, and he would  need significantly more weight loss to consider a repair later in life.   We will re-image in 6 months by CTA.   We discussed that acute chest pain could be symptoms of aortic dissection or rupture and would need immediate medical attention and could require emergent surgery. All questions asked and answered.      I  spent 60 minutes with  the patient face to face in counseling and coordination of care.    Pierre Bali Catelyn Friel 07/23/2022 9:23 PM

## 2022-07-13 ENCOUNTER — Other Ambulatory Visit: Payer: Self-pay | Admitting: Cardiovascular Disease

## 2022-07-17 DIAGNOSIS — M1712 Unilateral primary osteoarthritis, left knee: Secondary | ICD-10-CM | POA: Diagnosis not present

## 2022-08-21 ENCOUNTER — Other Ambulatory Visit (HOSPITAL_COMMUNITY): Payer: Self-pay

## 2022-08-21 DIAGNOSIS — M5416 Radiculopathy, lumbar region: Secondary | ICD-10-CM | POA: Diagnosis not present

## 2022-08-21 MED ORDER — MOUNJARO 10 MG/0.5ML ~~LOC~~ SOAJ
10.0000 mg | SUBCUTANEOUS | 5 refills | Status: DC
  Filled 2022-08-21: qty 2, 28d supply, fill #0

## 2022-08-28 DIAGNOSIS — E662 Morbid (severe) obesity with alveolar hypoventilation: Secondary | ICD-10-CM | POA: Diagnosis not present

## 2022-08-28 DIAGNOSIS — K760 Fatty (change of) liver, not elsewhere classified: Secondary | ICD-10-CM | POA: Diagnosis not present

## 2022-08-28 DIAGNOSIS — E1140 Type 2 diabetes mellitus with diabetic neuropathy, unspecified: Secondary | ICD-10-CM | POA: Diagnosis not present

## 2022-08-28 DIAGNOSIS — I5189 Other ill-defined heart diseases: Secondary | ICD-10-CM | POA: Diagnosis not present

## 2022-08-28 DIAGNOSIS — E786 Lipoprotein deficiency: Secondary | ICD-10-CM | POA: Diagnosis not present

## 2022-08-28 DIAGNOSIS — I10 Essential (primary) hypertension: Secondary | ICD-10-CM | POA: Diagnosis not present

## 2022-09-10 DIAGNOSIS — M961 Postlaminectomy syndrome, not elsewhere classified: Secondary | ICD-10-CM | POA: Diagnosis not present

## 2022-09-10 DIAGNOSIS — M5416 Radiculopathy, lumbar region: Secondary | ICD-10-CM | POA: Diagnosis not present

## 2022-09-10 DIAGNOSIS — M5136 Other intervertebral disc degeneration, lumbar region: Secondary | ICD-10-CM | POA: Diagnosis not present

## 2022-09-17 DIAGNOSIS — M1711 Unilateral primary osteoarthritis, right knee: Secondary | ICD-10-CM | POA: Diagnosis not present

## 2022-09-19 DIAGNOSIS — M48062 Spinal stenosis, lumbar region with neurogenic claudication: Secondary | ICD-10-CM | POA: Diagnosis not present

## 2022-09-19 DIAGNOSIS — Z6841 Body Mass Index (BMI) 40.0 and over, adult: Secondary | ICD-10-CM | POA: Diagnosis not present

## 2022-09-21 ENCOUNTER — Other Ambulatory Visit: Payer: Self-pay | Admitting: Neurological Surgery

## 2022-09-21 DIAGNOSIS — M48062 Spinal stenosis, lumbar region with neurogenic claudication: Secondary | ICD-10-CM

## 2022-10-02 DIAGNOSIS — G4733 Obstructive sleep apnea (adult) (pediatric): Secondary | ICD-10-CM | POA: Diagnosis not present

## 2022-10-16 DIAGNOSIS — R0601 Orthopnea: Secondary | ICD-10-CM | POA: Diagnosis not present

## 2022-10-16 DIAGNOSIS — E1165 Type 2 diabetes mellitus with hyperglycemia: Secondary | ICD-10-CM | POA: Diagnosis not present

## 2022-10-16 DIAGNOSIS — I5189 Other ill-defined heart diseases: Secondary | ICD-10-CM | POA: Diagnosis not present

## 2022-10-16 DIAGNOSIS — I712 Thoracic aortic aneurysm, without rupture, unspecified: Secondary | ICD-10-CM | POA: Diagnosis not present

## 2022-10-16 DIAGNOSIS — G4733 Obstructive sleep apnea (adult) (pediatric): Secondary | ICD-10-CM | POA: Diagnosis not present

## 2022-10-16 DIAGNOSIS — E1140 Type 2 diabetes mellitus with diabetic neuropathy, unspecified: Secondary | ICD-10-CM | POA: Diagnosis not present

## 2022-10-16 DIAGNOSIS — I10 Essential (primary) hypertension: Secondary | ICD-10-CM | POA: Diagnosis not present

## 2022-10-16 DIAGNOSIS — G629 Polyneuropathy, unspecified: Secondary | ICD-10-CM | POA: Diagnosis not present

## 2022-10-19 DIAGNOSIS — M1712 Unilateral primary osteoarthritis, left knee: Secondary | ICD-10-CM | POA: Diagnosis not present

## 2022-10-22 ENCOUNTER — Other Ambulatory Visit: Payer: Medicare PPO

## 2022-10-23 ENCOUNTER — Ambulatory Visit (HOSPITAL_BASED_OUTPATIENT_CLINIC_OR_DEPARTMENT_OTHER): Payer: Medicare PPO | Admitting: Cardiovascular Disease

## 2022-10-23 ENCOUNTER — Encounter (HOSPITAL_BASED_OUTPATIENT_CLINIC_OR_DEPARTMENT_OTHER): Payer: Self-pay | Admitting: Cardiovascular Disease

## 2022-10-23 VITALS — BP 134/80 | HR 80 | Ht 73.5 in | Wt 332.8 lb

## 2022-10-23 DIAGNOSIS — I5032 Chronic diastolic (congestive) heart failure: Secondary | ICD-10-CM

## 2022-10-23 DIAGNOSIS — I10 Essential (primary) hypertension: Secondary | ICD-10-CM

## 2022-10-23 DIAGNOSIS — G4733 Obstructive sleep apnea (adult) (pediatric): Secondary | ICD-10-CM | POA: Diagnosis not present

## 2022-10-23 DIAGNOSIS — I7121 Aneurysm of the ascending aorta, without rupture: Secondary | ICD-10-CM | POA: Diagnosis not present

## 2022-10-23 DIAGNOSIS — E7800 Pure hypercholesterolemia, unspecified: Secondary | ICD-10-CM | POA: Diagnosis not present

## 2022-10-23 MED ORDER — DOXAZOSIN MESYLATE 8 MG PO TABS: 8.0000 mg | ORAL_TABLET | Freq: Two times a day (BID) | ORAL | 3 refills | Status: DC

## 2022-10-23 NOTE — Patient Instructions (Signed)
Medication Instructions:  INCREASE DOXAZOSIN TO 8 MG TWICE A DAY   *If you need a refill on your cardiac medications before your next appointment, please call your pharmacy*  Lab Work: NONE  Testing/Procedures: NONE  Follow-Up: At [facility], you and your health needs are our priority.  As part of our continuing mission to provide you with exceptional heart care, we have created designated Provider Care Teams.  These Care Teams include your primary Cardiologist (physician) and Advanced Practice Providers (APPs -  Physician Assistants and Nurse Practitioners) who all work together to provide you with the care you need, when you need it.  We recommend signing up for the patient portal called "MyChart".  Sign up information is provided on this After Visit Summary.  MyChart is used to connect with patients for Virtual Visits (Telemedicine).  Patients are able to view lab/test results, encounter notes, upcoming appointments, etc.  Non-urgent messages can be sent to your provider as well.   To learn more about what you can do with MyChart, go to [email or web address].    Your next appointment:   6 month(s)  Provider:   Ronn Melena NP   1 YEAR WITH Chilton Si, MD

## 2022-10-23 NOTE — Progress Notes (Signed)
Cardiology Office Note:    Date:  10/23/2022   ID:  Tommy [name], DOB [birth year], MRN [id number]  PCP:  Creola Corn, MD   [facility] Providers Cardiologist:  Chilton Si, MD {  Referring MD: Creola Corn, MD    History of Present Illness:    Tommy [name] is a 77 y.o. male with a hx of asthma, hypertension, GERD, fatty liver disease, OSA on CPAP and morbid obesity who presents for follow up on shortness of breath. Tommy [name] saw his PCP, Dr. Creola Corn, on 09/20/15. At that appointment it was noted that a chest x-ray in January 2014 showed a tortuous aorta and calcifications of the thoracic aorta. He also reported dyspnea on exertion. He was referred to cardiology for further evaluation.  He was referred for a Lexiscan Myoview 10/2015 that was negative for ischemia but the ejection fraction was reported as 30-44%. He subsequently had an echocardiogram that revealed normal systolic function and mild LVH.  Diastolic function was abnormal but the degree of dysfunction was not commented upon and the echo is not currently available for review.    Tommy [name]'s BP remained elevated so doxazosin was increased to 6mg  bid.  At his last appointment, he had chronic shortness of breath which was due to obesity and deconditioning. He was not interested in making any drastic interventions. He saw Dr Timothy Lasso and continued to report shortness of breath. He had an echo 4/22 that revealed LVEF 60-65% ad grade 1 diastolic dysfunction. His ascending aorta was 4.5 cm which was unchanged from 2017.  At his visit 04/2022, he was doing well aside from bilateral knee pain secondary to osteoarthritis. Since starting the [facility] he had lost about 30 lbs, but he had a goal of losing 30 more pounds to be eligible for knee arthroplasty. Blood pressures were well controlled at home; initially high in the office but improved on repeat. He continued to have mild lower extremity edema. He had a repeat echo  04/2022 showing LVEF 55-60%, mild LVH, and grade 1 diastolic dysfunction. There was trivial mitral and aortic valve regurgitation. Ascending aortic aneurysm measured 49 mm; aneurysm of the aortic root measured 45 mm; aneurysm of the aortic arch measured 46 mm. He also had a CTA of the chest/aorta 06/2022 revealing ascending thoracic aortic aneurysm of 5.0 cm, indeterminate 16 mm left adrenal nodule probable for adrenal adenoma, and small hiatal hernia. Referral to cardiothoracic surgery was recommended.  Today, he states that he is feeling good. He presents a BP log which is personally reviewed, showing a range of 120s-140s systolic, mostly averaging in the 130s. He has noticed becoming a little more short of breath lately, but he attributes this to not being as active. Once in a blue moon he may be sitting at rest and suddenly feel winded. He has been able to work in the yard, but after a while he needs to take a break. He endorses lower extremity swelling, R>L. Usually he is taking Lasix once a week on Saturdays. If his legs seem tight, he will take the Lasix twice a week. He does well with cooking meals at home and not adding salt. He notes that he will soon have a repeat MRI of the lumbar spine. When he tries to be active, his hips and legs tighten up after 20 minutes. He continues to receive routine steroidal injections for his knees. Last year he had successfully lost some weight with exercising in the pool; he plans to try  resuming this form of exercise. He still tolerates Mounjaro when he is able to obtain a supply from the pharmacy. He denies any palpitations, chest pain, lightheadedness, headaches, syncope, or PND.  Past Medical History:  Diagnosis Date   Allergy    Anemia    "as teenager"   Arthritis    hands, hip   Ascending aortic aneurysm (HCC) 04/23/2022   Back pain    BPPV (benign paroxysmal positional vertigo)    History of   Cancer (HCC)    "melanoma removed from leg years ago",  malignant mole   Cataract    Chronic back pain    Chronic diastolic heart failure (HCC) 04/23/2022   Complication of anesthesia    woke up and was confused and combative after back surgery   Constipation    DDD (degenerative disc disease), lumbar    Diabetes mellitus without complication (HCC)    Diabetic neuropathy (HCC)    ED (erectile dysfunction)    Essential hypertension 10/16/2015   Fatty liver    First degree AV block    EKG 12/03/16   GERD (gastroesophageal reflux disease)    diet controlled, no meds currently, history of   H/O hiatal hernia    History of elevated PSA    Nocturia more than twice per night 12/21/2013   OA (osteoarthritis)    Obesity hypoventilation syndrome (HCC) 04/07/2014   Obesity, morbid (HCC) 12/21/2013   OSA on CPAP 04/07/2014   Pure hypercholesterolemia 04/18/2021   RBBB    previous EKG   Sleep apnea    uses CPAP nightly   Snoring 12/21/2013   Spondylosis 2014   lumbar back, noted on MR    Ulcer    stomach ulcer "as teenager"    Past Surgical History:  Procedure Laterality Date   BACK SURGERY     "done approx. 24 years ago"   CARDIOVASCULAR STRESS TEST     "done approx 10 years ago, Dr. Timothy Lasso"   COLONOSCOPY  2006   hx of "with removal of polyps"/[facility]   COLONOSCOPY WITH PROPOFOL N/A 09/06/2015   Procedure: COLONOSCOPY WITH PROPOFOL;  Surgeon: Meryl Dare, MD;  Location: [facility];  Service: Endoscopy;  Laterality: N/A;   lipoma removed     from leg   LUMBAR LAMINECTOMY/DECOMPRESSION MICRODISCECTOMY  06/19/2012   Procedure: LUMBAR LAMINECTOMY/DECOMPRESSION MICRODISCECTOMY 1 LEVEL;  Surgeon: Venita Lick, MD;  Location: [facility] OR;  Service: Orthopedics;  Laterality: Left;  L2-3 LEFT MICRODISCECTOMY   thigh surgery     fatty deposit   TONSILLECTOMY     TOTAL HIP ARTHROPLASTY Right 06/05/2017   Procedure: RIGHT TOTAL HIP ARTHROPLASTY ANTERIOR APPROACH;  Surgeon: Ollen Gross, MD;  Location: [facility] ORS;  Service: Orthopedics;   Laterality: Right;  Dr. requested 120 minutes   WISDOM TOOTH EXTRACTION     x 1 tooth    Current Medications: Current Meds  Medication Sig   ACCU-CHEK AVIVA PLUS test strip    Accu-Chek Softclix Lancets lancets    amLODipine (NORVASC) 10 MG tablet Take 10 mg by mouth every morning.    aspirin EC 81 MG tablet Take 81 mg by mouth daily.   cyclobenzaprine (FLEXERIL) 5 MG tablet Take 5 mg by mouth as needed for muscle spasms.   FIBER PO Take 2 tablets by mouth every morning.   furosemide (LASIX) 40 MG tablet Take 40 mg by mouth 2 (two) times a week.   gabapentin (NEURONTIN) 600 MG tablet Take 600 mg by mouth 2 (two) times  daily.   Glucosamine-Chondroit-Vit C-Mn (GLUCOSAMINE 1500 COMPLEX PO) Take by mouth.   INVOKANA 100 MG TABS tablet    ipratropium (ATROVENT) 0.03 % nasal spray Place 1 spray into both nostrils at bedtime as needed for rhinitis.    linaCLOtide (LINZESS PO) Take by mouth daily.   metFORMIN (GLUCOPHAGE) 500 MG tablet Take 500 mg by mouth daily with breakfast.    mometasone (NASONEX) 50 MCG/ACT nasal spray Place 2 sprays into the nose daily.   montelukast (SINGULAIR) 10 MG tablet Take 10 mg by mouth every morning.   MOUNJARO 15 MG/0.5ML Pen Inject 15 mg into the skin once a week.   Multiple Vitamin (MULTIVITAMIN) capsule Take 1 capsule by mouth daily.   naproxen (NAPROSYN) 500 MG tablet Take 500 mg by mouth 2 (two) times daily with a meal.   Nebivolol HCl 20 MG TABS Take 1 tablet (20 mg total) by mouth daily.   rosuvastatin (CRESTOR) 5 MG tablet rosuvastatin 5 mg tablet   valsartan-hydrochlorothiazide (DIOVAN-HCT) 320-25 MG per tablet Take 1 tablet by mouth every morning.   [DISCONTINUED] doxazosin (CARDURA) 2 MG tablet TAKE 1 TABLET BY MOUTH 2 TIMES DAILY. TO BE TAKEN WITH THE 4 MG TABLETS.   [DISCONTINUED] doxazosin (CARDURA) 4 MG tablet TAKE 1 TABLET BY MOUTH 2 TIMES DAILY.     Allergies:   Linaclotide and Micardis [telmisartan]   Social History   Socioeconomic  History   Marital status: Married    Spouse name: Tommy [name]   Number of children: 2   Years of education: Masters   Highest education level: Not on file  Occupational History    Employer: RETIRED  Tobacco Use   Smoking status: Never   Smokeless tobacco: Never  Vaping Use   Vaping Use: Never used  Substance and Sexual Activity   Alcohol use: No    Alcohol/week: 0.0 standard drinks of alcohol   Drug use: No   Sexual activity: Not on file  Other Topics Concern   Not on file  Social History Narrative   Patient is married Pensions consultant) and lives at home with his wife.   Patient has two adult children.   Patient is retired.   Patient has a Master's degree.   Patient is right-handed   Patient does not drink any caffeine.   Social Determinants of Health   Financial Resource Strain: Not on file  Food Insecurity: Not on file  Transportation Needs: Not on file  Physical Activity: Not on file  Stress: Not on file  Social Connections: Not on file     Family History: The patient's family history includes Breast cancer in his sister; Colon polyps in his mother; Dementia in his mother; Heart attack in his sister; Heart failure in his father; Sleep apnea in his brother; Thyroid disease in his sister. There is no history of Colon cancer, Rectal cancer, Stomach cancer, or Esophageal cancer.  ROS:   Please see the history of present illness.    (+) Shortness of breath (+) LE edema, R>L (+) Bilateral Hip and leg pain (+) Bilateral knee pain All other systems reviewed and are negative.  EKGs/Labs/Other Studies Reviewed:    The following studies were reviewed today:  Echo 09/01/2020:  1. Left ventricular ejection fraction, by estimation, is 60 to 65%. The left ventricle has normal function. The left ventricle has no regional wall motion abnormalities. The left ventricular internal cavity size was mildly dilated. There is moderate concentric left ventricular hypertrophy. Left ventricular  diastolic parameters are consistent  with Grade I diastolic dysfunction (impaired relaxation).   2. Right ventricular systolic function is normal. The right ventricular size is normal. Tricuspid regurgitation signal is inadequate for assessing PA pressure.   3. The mitral valve is normal in structure. Trivial mitral valve regurgitation.   4. The aortic valve is tricuspid. There is mild calcification of the aortic valve. There is mild thickening of the aortic valve. Aortic valve regurgitation is not visualized. Mild aortic valve sclerosis is present, with no evidence of aortic valve stenosis.   5. Aortic dilatation noted. There is mild dilatation of the aortic root, measuring 41 mm. There is moderate dilatation of the ascending aorta, measuring 45 mm.   6. Consider CTA or MRA of the aorta to better assess aortic dilation due to suboptimal echo windows.    Echo 11/03/15: Study Conclusions - Procedure narrative: Transthoracic echocardiography. Image   quality was suboptimal. The study was technically difficult.   Intravenous contrast (Definity) was administered. - Left ventricle: The cavity size was mildly dilated. Wall   thickness was increased in a pattern of mild LVH. Systolic   function was normal. The estimated ejection fraction was in the   range of 55% to 60%. Wall motion was normal; there were no   regional wall motion abnormalities. Acoustic contrast   opacification revealed no evidence ofthrombus. - Left atrium: The atrium was mildly dilated. - Right ventricle: The cavity size was mildly dilated.   Lexiscan Myoview 11/03/15: Nuclear stress EF: 44%. There was no ST segment deviation noted during stress. No T wave inversion was noted during stress. Defect 1: There is a small defect of moderate severity present in the apex location. Apical thinning vs prior infarct. This is an intermediate risk study due to reduced LVEF. The left ventricular ejection fraction is moderately decreased  (30-44%). No ischemia.  Echo 11/03/15: Study Conclusions - Procedure narrative: Transthoracic echocardiography. Image   quality was suboptimal. The study was technically difficult.   Intravenous contrast (Definity) was administered. - Left ventricle: The cavity size was mildly dilated. Wall   thickness was increased in a pattern of mild LVH. Systolic   function was normal. The estimated ejection fraction was in the   range of 55% to 60%. Wall motion was normal; there were no regional wall motion abnormalities. Acoustic contrast opacification revealed no evidence ofthrombus. - Left atrium: The atrium was mildly dilated. - Right ventricle: The cavity size was mildly dilated.   Lexiscan Myoview 11/03/15: Nuclear stress EF: 44%. There was no ST segment deviation noted during stress. No T wave inversion was noted during stress. Defect 1: There is a small defect of moderate severity present in the apex location. Apical thinning vs prior infarct. This is an intermediate risk study due to reduced LVEF. The left ventricular ejection fraction is moderately decreased (30-44%). No ischemia.  EKG:   EKG is personally reviewed. 10/23/2022: Not ordered. 04/23/22: Sinus rhythm. Rate 64 bpm. RBBB, First degree AV block 04/18/21: Sinus rhythm. Rate: 64 bpm.  RBBB First degree AV block. LAFB  Recent Labs: 04/23/2022: BNP 31.6 06/15/2022: BUN 16; Creatinine, Ser 0.73; Potassium 3.9; Sodium 142   Recent Lipid Panel No results found for: "CHOL", "TRIG", "HDL", "CHOLHDL", "VLDL", "LDLCALC", "LDLDIRECT"   Risk Assessment/Calculations:           Physical Exam:    Wt Readings from Last 3 Encounters:  10/23/22 (!) 332 lb 12.8 oz (151 kg)  07/12/22 (!) 335 lb (152 kg)  12/26/21 (!) 340 lb (154.2 kg)  VS:  BP 134/80   Pulse 80   Ht 6' 1.5" (1.867 m)   Wt (!) 332 lb 12.8 oz (151 kg)   BMI 43.31 kg/m  , BMI Body mass index is 43.31 kg/m. GENERAL:  Well appearing HEENT: Pupils equal round and  reactive, fundi not visualized, oral mucosa unremarkable NECK:  No jugular venous distention, waveform within normal limits, carotid upstroke brisk and symmetric, no bruits, no thyromegaly LUNGS:  Clear to auscultation bilaterally HEART:  RRR.  PMI not displaced or sustained, S1 and S2 within normal limits, no S3, no S4, no clicks, no rubs, no murmurs ABD:  Flat, positive bowel sounds normal in frequency in pitch, no bruits, no rebound, no guarding, no midline pulsatile mass, no hepatomegaly, no splenomegaly EXT:  2 plus pulses throughout, trace LE edema, no cyanosis, no clubbing SKIN:  Chronic stasis dermatitis. NEURO:  Cranial nerves II through XII grossly intact, motor grossly intact throughout PSYCH:  Cognitively intact, oriented to person place and time   ASSESSMENT:    1. Essential hypertension   2. Chronic diastolic heart failure (HCC)   3. Aneurysm of ascending aorta without rupture (HCC)   4. OSA on CPAP   5. Obesity, morbid (HCC)   6. Pure hypercholesterolemia    PLAN:     # Ascending aorta aneurysm: 5.0 cm.  He is followed by Dr. Delia Chimes twice per week. BP control as below.  He would need to lose weight prior to elective repair.  # HFpEF: # Hypertension:  Occasional shortness of breath, no chest pain.   His lungs are clear on exam.  Minimal LE edema.  Blood pressures slightly above target range, averaging around 130s. -Increase Doxazosin to 8mg  twice daily to better control blood pressure. - Continue nebivolol, valsartan/HCTZ and amlodipine -Continue current management with Lasix once per week and as needed for fluid management.  # Hyperlipidemia: Well-controlled on low-dose Rosuvastatin. -Continue current management with Rosuvastatin.   # Morbid obesity:  -Encouraged pool exercises for low-impact physical activity. -Continue Mounjaro     # OSA: Continue CPAP.   Disposition: FU with APP in 6 months. FU with Elliot Meldrum C. Duke Salvia, MD, [facility] in 1 year.  Medication  Adjustments/Labs and Tests Ordered: Current medicines are reviewed at length with the patient today.  Concerns regarding medicines are outlined above.   No orders of the defined types were placed in this encounter.  Meds ordered this encounter  Medications   doxazosin (CARDURA) 8 MG tablet    Sig: Take 1 tablet (8 mg total) by mouth 2 (two) times daily.    Dispense:  180 tablet    Refill:  3    D/C 4 MG AND 2 MG RX   Patient Instructions  Medication Instructions:  INCREASE DOXAZOSIN TO 8 MG TWICE A DAY   *If you need a refill on your cardiac medications before your next appointment, please call your pharmacy*  Lab Work: NONE  Testing/Procedures: NONE  Follow-Up: At [facility], you and your health needs are our priority.  As part of our continuing mission to provide you with exceptional heart care, we have created designated Provider Care Teams.  These Care Teams include your primary Cardiologist (physician) and Advanced Practice Providers (APPs -  Physician Assistants and Nurse Practitioners) who all work together to provide you with the care you need, when you need it.  We recommend signing up for the patient portal called "MyChart".  Sign up information is provided on this After Visit  Summary.  MyChart is used to connect with patients for Virtual Visits (Telemedicine).  Patients are able to view lab/test results, encounter notes, upcoming appointments, etc.  Non-urgent messages can be sent to your provider as well.   To learn more about what you can do with MyChart, go to [email or web address].    Your next appointment:   6 month(s)  Provider:   Ronn Melena NP   1 YEAR WITH Chilton Si, MD       [facility] Stump[facility],acting as a scribe for Chilton Si, MD.,have documented all relevant documentation on the behalf of Chilton Si, MD,as directed by  Chilton Si, MD while in the presence of Chilton Si, MD.  I, Alfie Rideaux C. Duke Salvia, MD have  reviewed all documentation for this visit.  The documentation of the exam, diagnosis, procedures, and orders on 10/23/2022 are all accurate and complete.   Signed, Chilton Si, MD  10/23/2022 8:51 AM    [facility]

## 2022-10-24 DIAGNOSIS — M47816 Spondylosis without myelopathy or radiculopathy, lumbar region: Secondary | ICD-10-CM | POA: Diagnosis not present

## 2022-10-24 DIAGNOSIS — Z9889 Other specified postprocedural states: Secondary | ICD-10-CM | POA: Diagnosis not present

## 2022-10-24 DIAGNOSIS — M48062 Spinal stenosis, lumbar region with neurogenic claudication: Secondary | ICD-10-CM | POA: Diagnosis not present

## 2022-11-27 ENCOUNTER — Other Ambulatory Visit: Payer: Self-pay | Admitting: Cardiothoracic Surgery

## 2022-11-27 DIAGNOSIS — I7121 Aneurysm of the ascending aorta, without rupture: Secondary | ICD-10-CM

## 2022-12-13 DIAGNOSIS — G4733 Obstructive sleep apnea (adult) (pediatric): Secondary | ICD-10-CM | POA: Diagnosis not present

## 2022-12-13 DIAGNOSIS — K760 Fatty (change of) liver, not elsewhere classified: Secondary | ICD-10-CM | POA: Diagnosis not present

## 2022-12-13 DIAGNOSIS — E662 Morbid (severe) obesity with alveolar hypoventilation: Secondary | ICD-10-CM | POA: Diagnosis not present

## 2022-12-13 DIAGNOSIS — E1140 Type 2 diabetes mellitus with diabetic neuropathy, unspecified: Secondary | ICD-10-CM | POA: Diagnosis not present

## 2022-12-13 DIAGNOSIS — I10 Essential (primary) hypertension: Secondary | ICD-10-CM | POA: Diagnosis not present

## 2022-12-18 DIAGNOSIS — M1711 Unilateral primary osteoarthritis, right knee: Secondary | ICD-10-CM | POA: Diagnosis not present

## 2022-12-27 NOTE — Progress Notes (Signed)
PATIENT: Tommy [name] DOB: September 12, 1945  REASON FOR VISIT: follow up HISTORY FROM: patient  Chief Complaint  Patient presents with   Follow-up    Pt in room 2. Here for cpap follow up. Pt reports doing well on cpap machine.     HISTORY OF PRESENT ILLNESS:  12/26/2021 ALL: Tommy [name] returns for follow up for OSA on CPAP. He continues to do very well on therapy. He is using CPAP nightly for about 6-7 hours, on average. No trouble with machine or supplies. He does have an extra water chamber to use if needed during the night. He continues to have disrupted sleep due to bilateral knee pain. Also having some shoulder pain. His wife had back surgery at the beginning of the year and required inpatient rehab following. Unfortunately, she recently had a fall and needs TKR. Tommy [name] has postponed his surgery until she is better. He continues close follow up with PCP, cardiology and ortho.     12/26/2021 ALL: Tommy [name] returns for follow up for OSA on CPAP. He reports doing very well on therapy. He is having a harder time sleeping due to bilateral knee and back pain. He tosses and turns all night. He may sleep 6 hours a night. He is preparing for knee replacement. He is getting nerve blocks and knee injections that have helped a little. He has lost 36lbs. He is on [facility] and planning to increase dose soon.   Compliance report over the past 30 days shows excellent compliance. 100% with daily and 4 hour usage. Residual AHI 0.9/hr. Leak is slightly elevated at 21l/min.   12/21/2020 ALL: Tommy [name] returns for follow up for OSA on CPAP. He received new CPAP machine 09/2020. He is doing well. He feels that he tolerates new machine much better. He is now using [employer] for DME. He does have some congestion in the mornings. Using Nasonex and Atrovent nasal spray.     09/23/2019 ALL: Tommy [name] is a 77 y.o. male here today for follow up for OSA on CPAP.  He is doing very well  today.  He continues CPAP therapy nightly and for greater than 4 hours each night.  He reports he cannot sleep without using CPAP.  He has noted that his machine does not power on at times.  He has to press the power button several times before it will start.  His machine is approximately 77 years old.  He would like to consider getting a new CPAP machine.  Otherwise he is doing very well and has no concerns.  Compliance report dated 08/23/2019 through 09/21/2019 reveals that he is used CPAP therapy every night for compliance of 100%.  He has used CPAP 4 hours every night.  Average usage was 6 hours and 33 minutes.  Residual AHI was 3.0 on 10 to 16 cm of water and an EPR of 3.  There was no significant leak noted.  HISTORY: (copied from my note on 10/12/2018)  Tommy [name] is a 77 y.o. male for follow up for OSA on CPAP. He continues to do well with therapy and has no concerns today.    09/15/2018 - 10/14/2018  - 10/14/2018 Usage days 30/30 days (100%) >= 4 hours 30 days (100%) < 4 hours 0 days (0%) Usage hours 203 hours 26 minutes Average usage (total days) 6 hours 47 minutes Average usage (days used) 6 hours 47 minutes Median usage (days used) 6 hours 48 minutes Total used hours (value  since last reset - 10/14/2018) 11,335 hours AirSense 10 AutoSet Serial number [id number] Mode AutoSet Min Pressure 10 cmH2O Max Pressure 16 cmH2O EPR Fulltime EPR level 3 Therapy Pressure - cmH2O Median: 11.0 95th percentile: 12.5 Maximum: 13.6 Leaks - L/min Median: 3.4 95th percentile: 12.1 Maximum: 16.4 Events per hour AI: 2.2 HI: 0.6 AHI: 2.8 Apnea Index Central: 0.8 Obstructive: 1.3 Unknown: 0.0 RERA Index 0.0 Cheyne-Stokes respiration (average duration per night) 10 minutes (3%)   History (copied from Dr Oliva Bustard note on 10/10/2017)    Interval history from 10 Oct 2017, I have the pleasure of meeting with Tommy [name]. Tommy [name] today who underwent a replacement surgery under the  guidance of Dr. Malena Catholic.  His right hip is now healed recovered and he is fairly mobile.  He reports that in the last 6 weeks he has done more project in the whole year before.  In order to get ready for surgery and limit his surgical risk he was able to lose weight but he has already gained back 20 pounds.  But it was previously hip pain that interrupted and fragmented his sleep it is now nocturia.  He states that he has no longer blocks of 4 interrupted interrupted hours of sleep but only about 2.   He has been as in the years before 100% compliant with CPAP he is using an AutoSet between 10 and 16 cmH2O with 3 cm EPR and this window covers his current pressure needs.  His pressure at the 95th percentile is 11.9 cmH2O so well was in the window.  Residual apnea and hypercapnia index AHI was 1.5/h and they were 3 minutes or 1% of the night of Cheyne-Stokes respirations. He takes a muscle relaxant at night.  DME is [facility]-mask type nasal pillow, air fit P 10  Epworth 5 points, not bad. FSS at 35, geriatric depression score is 2/15. He sleeps so much better with CPAP, noted the difference when his home lost power.    Fam history: Brother has now OSA with CPAP.  Patient reports vivid dreams in AM - not enacting them.    Interval history from 10/10/2016 area at the pleasure of seeing Tommy. [name] today, who is diabetic, hypertensive and was diagnosed with obstructive sleep apnea in 2015. His diabetes is currently treated with invokana- this causes some nocturia. He is compliant with his CPAP use, uses an AutoSet between 10 and 16 cm water with 3 cm EPR achieving a residual AHI of 1.2 apneas per hour. His 100% compliant by days and hours of use was an average user time of 6 hours and 50 minutes. He is using an air sense machine by [employer] and nasal pillows. No adjustments have to be made on this side. He still uses CPAP without ramp function and with a high starting pressure. I will repeat an ONO to see if he  still needs oxygen. He likes to travel and oxygen need complicates things. He has hip pain, awaiting a consultation with Dr. Despina Hick.   His wife is on CPAP, too.    HISTORY OF PRESENT ILLNESS: Tommy [name] is a 77 y.o.caucasian , right handed  male , who is seen here as a referral from Dr. Timothy Lasso for sleep evaluation.  The patient  Is a retired [employer] manager, Editor, commissioning. He has sleep problems of nocturia, snoring and GERD in sleep, EDS . He also has a history of osteoarthritis allergic rhinitis, GERD, hypertension, fatty liver disease. He has a family history of  heart disease or hypertension. Tommy [name] reports that she had back surgery ( laminectomy in  level L4-L5  ) and had reported to his orthopedists that he was excessively daytime sleepy and had trouble staying awake when driving longer distances. Dr. Shon Baton asked him to make his primary care physician aware of this problem.   Prior to his back surgery in 2014 the patient was used to sleep in a recliner mainly because he could not find a comfortable position in bed. Since his back surgery this has improved tremendously and he is now sleeping in a regular bed. He has aches and pain when exercising, gained weight even after back surgery, he gained 60 pounds before surgery in only 3 years. He is frequently short of breath when just speaking, not exercising.He has a sister with heart disease, a brother with obstructive sleep apnea ,  mother but was diagnosed as obstructive sleep apnea and his father is deceased with congestive heart failure.    Interval history 04-07-14 CD Tommy. [name] underwent a split night polysomnography on 02-01-14 after endorsing the Epworth Sleepiness Scale at 16 points and presenting with a neck circumference of 20 inches and a BMI of 48.5. The patient's AHI was 43.1 he did not have any REM sleep, he did not sleep supine for these 2 hours of diagnostic study - his CO2 increased to 54.44. His oxygen nadir was 84% for  43.1 minutes the patient was titrated and interestingly his oxygen saturations actually decreased further 164 minutes of desaturations were found during the CPAP titration with a nadir at 71% [name]. And he had nocturia several times at night. He was fitted with an O2 pressure window between 10 and 16 cm water. We also recommended possible oxygen therapy should his hypoxemia continue on CPAP. Today is the patient's first visit after sleep study and he has noted some remarkable differences for example his Epworth Sleepiness Scale is now 10 and his fatigue severity score 41 points. The first 30 days of use he struggled with his CPAP and mostly slept in a recliner. He now has adjusted quite well at 30 day download dated 04-05-16 chills 5 hours and 33 minutes of daily use 100% compliance of full-time EPR level of 3 cm water and a pressure of 12.9 cm water at the 91st percentile. His residual AHI is 1.3. There were no Cheyne-Stokes respirations noted. An adjustment of settings is not necessary but the patient wishes to not have a ramp function that. He has likely to use some additional oxygen. ONO showed on CPAP low oxygenation.  He has taken off the RAMP function and reduced the humidity.   He has not fallen asleep in church since being on CPAP.    REVIEW OF SYSTEMS: Out of a complete 14 system review of symptoms, the patient complains only of the following symptoms, morning congestion, chronic hip pain, and all other reviewed systems are negative.  ESS: 9/24, previously 13/24  ALLERGIES: Allergies  Allergen Reactions   Linaclotide Other (See Comments)   Micardis [Telmisartan] Hives and Rash    HOME MEDICATIONS: Outpatient Medications Prior to Visit  Medication Sig Dispense Refill   ACCU-CHEK AVIVA PLUS test strip      Accu-Chek Softclix Lancets lancets      amLODipine (NORVASC) 10 MG tablet Take 10 mg by mouth every morning.      aspirin EC 81 MG tablet  Take 81 mg by mouth daily.  cyclobenzaprine (FLEXERIL) 5 MG tablet Take 5 mg by mouth as needed for muscle spasms.     doxazosin (CARDURA) 8 MG tablet Take 1 tablet (8 mg total) by mouth 2 (two) times daily. 180 tablet 3   FIBER PO Take 2 tablets by mouth every morning.     furosemide (LASIX) 40 MG tablet Take 40 mg by mouth 2 (two) times a week.     gabapentin (NEURONTIN) 600 MG tablet Take 600 mg by mouth 2 (two) times daily.     Glucosamine-Chondroit-Vit C-Mn (GLUCOSAMINE 1500 COMPLEX PO) Take by mouth.     INVOKANA 100 MG TABS tablet      ipratropium (ATROVENT) 0.03 % nasal spray Place 1 spray into both nostrils at bedtime as needed for rhinitis.      linaCLOtide (LINZESS PO) Take by mouth daily.     metFORMIN (GLUCOPHAGE) 500 MG tablet Take 500 mg by mouth daily with breakfast.      mometasone (NASONEX) 50 MCG/ACT nasal spray Place 2 sprays into the nose daily.     montelukast (SINGULAIR) 10 MG tablet Take 10 mg by mouth every morning.     MOUNJARO 15 MG/0.5ML Pen Inject 15 mg into the skin once a week.     Multiple Vitamin (MULTIVITAMIN) capsule Take 1 capsule by mouth daily.     naproxen (NAPROSYN) 500 MG tablet Take 500 mg by mouth 2 (two) times daily with a meal.     Nebivolol HCl 20 MG TABS Take 1 tablet (20 mg total) by mouth daily. 90 tablet 3   rosuvastatin (CRESTOR) 5 MG tablet rosuvastatin 5 mg tablet     valsartan-hydrochlorothiazide (DIOVAN-HCT) 320-25 MG per tablet Take 1 tablet by mouth every morning.     No facility-administered medications prior to visit.    PAST MEDICAL HISTORY: Past Medical History:  Diagnosis Date   Allergy    Anemia    "as teenager"   Arthritis    hands, hip   Ascending aortic aneurysm (HCC) 04/23/2022   Back pain    BPPV (benign paroxysmal positional vertigo)    History of   Cancer (HCC)    "melanoma removed from leg years ago", malignant mole   Cataract    Chronic back pain    Chronic diastolic heart failure (HCC) 04/23/2022    Complication of anesthesia    woke up and was confused and combative after back surgery   Constipation    DDD (degenerative disc disease), lumbar    Diabetes mellitus without complication (HCC)    Diabetic neuropathy (HCC)    ED (erectile dysfunction)    Essential hypertension 10/16/2015   Fatty liver    First degree AV block    EKG 12/03/16   GERD (gastroesophageal reflux disease)    diet controlled, no meds currently, history of   H/O hiatal hernia    History of elevated PSA    Nocturia more than twice per night 12/21/2013   OA (osteoarthritis)    Obesity hypoventilation syndrome (HCC) 04/07/2014   Obesity, morbid (HCC) 12/21/2013   OSA on CPAP 04/07/2014   Pure hypercholesterolemia 04/18/2021   RBBB    previous EKG   Sleep apnea    uses CPAP nightly   Snoring 12/21/2013   Spondylosis 2014   lumbar back, noted on Tommy    Ulcer    stomach ulcer "as teenager"    PAST SURGICAL HISTORY: Past Surgical History:  Procedure Laterality Date   BACK SURGERY     "done  approx. 24 years ago"   CARDIOVASCULAR STRESS TEST     "done approx 10 years ago, Dr. Timothy Lasso"   COLONOSCOPY  2006   hx of "with removal of polyps"/[facility]   COLONOSCOPY WITH PROPOFOL N/A 09/06/2015   Procedure: COLONOSCOPY WITH PROPOFOL;  Surgeon: Meryl Dare, MD;  Location: [facility];  Service: Endoscopy;  Laterality: N/A;   lipoma removed     from leg   LUMBAR LAMINECTOMY/DECOMPRESSION MICRODISCECTOMY  06/19/2012   Procedure: LUMBAR LAMINECTOMY/DECOMPRESSION MICRODISCECTOMY 1 LEVEL;  Surgeon: Venita Lick, MD;  Location: [facility] OR;  Service: Orthopedics;  Laterality: Left;  L2-3 LEFT MICRODISCECTOMY   thigh surgery     fatty deposit   TONSILLECTOMY     TOTAL HIP ARTHROPLASTY Right 06/05/2017   Procedure: RIGHT TOTAL HIP ARTHROPLASTY ANTERIOR APPROACH;  Surgeon: Ollen Gross, MD;  Location: [facility];  Service: Orthopedics;  Laterality: Right;  Dr. requested 120 minutes   WISDOM TOOTH EXTRACTION     x 1 tooth     FAMILY HISTORY: Family History  Problem Relation Age of Onset   Dementia Mother    Colon polyps Mother    Heart failure Father    Breast cancer Sister    Heart attack Sister    Thyroid disease Sister    Sleep apnea Brother    Colon cancer Neg Hx    Rectal cancer Neg Hx    Stomach cancer Neg Hx    Esophageal cancer Neg Hx     SOCIAL HISTORY: Social History   Socioeconomic History   Marital status: Married    Spouse name: Claris Gower   Number of children: 2   Years of education: Masters   Highest education level: Not on file  Occupational History    Employer: RETIRED  Tobacco Use   Smoking status: Never   Smokeless tobacco: Never  Vaping Use   Vaping status: Never Used  Substance and Sexual Activity   Alcohol use: No    Alcohol/week: 0.0 standard drinks of alcohol   Drug use: No   Sexual activity: Not on file  Other Topics Concern   Not on file  Social History Narrative   Patient is married Pensions consultant) and lives at home with his wife.   Patient has two adult children.   Patient is retired.   Patient has a Master's degree.   Patient is right-handed   Patient does not drink any caffeine.   Social Determinants of Health   Financial Resource Strain: Not on file  Food Insecurity: Not on file  Transportation Needs: Not on file  Physical Activity: Not on file  Stress: Not on file  Social Connections: Unknown (10/12/2022)   Received from [facility], [facility]   Social Network    Social Network: Not on file  Intimate Partner Violence: Unknown (10/12/2022)   [facility] from [facility], [facility]   HITS    Physically Hurt: Not on file    Insult or Talk Down To: Not on file    Threaten Physical Harm: Not on file    Scream or Curse: Not on file      PHYSICAL EXAM  Vitals:   12/31/22 0938  BP: 132/75  Pulse: 64  Weight: (!) 340 lb (154.2 kg)  Height: 6' 1.5" (1.867 m)      Body mass index is 44.25 kg/m.  Generalized: Well developed, in  no acute distress  Cardiology: normal rate and rhythm, no murmur noted Respiratory: clear to auscultation bilaterally  Neurological examination  Mentation:  Alert oriented to time, place, history taking. Follows all commands speech and language fluent Cranial nerve II-XII: Pupils were equal round reactive to light. Extraocular movements were full, visual field were full  Motor: The motor testing reveals 5 over 5 strength of all 4 extremities. Good symmetric motor tone is noted throughout.  Gait and station: Gait is arthritic but stable without assistive device.   DIAGNOSTIC DATA (LABS, IMAGING, TESTING) - I reviewed patient records, labs, notes, testing and imaging myself where available.      No data to display           Lab Results  Component Value Date   WBC 17.2 (H) 06/06/2017   HGB 13.9 06/06/2017   HCT 41.8 06/06/2017   MCV 90.3 06/06/2017   PLT 248 06/06/2017      Component Value Date/Time   NA 142 06/15/2022 0819   K 3.9 06/15/2022 0819   CL 101 06/15/2022 0819   CO2 26 06/15/2022 0819   GLUCOSE 117 (H) 06/15/2022 0819   GLUCOSE 155 (H) 06/06/2017 0601   BUN 16 06/15/2022 0819   CREATININE 0.73 (L) 06/15/2022 0819   CALCIUM 9.1 06/15/2022 0819   PROT 7.4 05/30/2017 0902   ALBUMIN 4.2 05/30/2017 0902   AST 21 05/30/2017 0902   ALT 17 05/30/2017 0902   ALKPHOS 115 05/30/2017 0902   BILITOT 1.2 05/30/2017 0902   GFRNONAA >60 06/06/2017 0601   GFRAA >60 06/06/2017 0601   No results found for: "CHOL", "HDL", "LDLCALC", "LDLDIRECT", "TRIG", "CHOLHDL" Lab Results  Component Value Date   HGBA1C 5.2 05/30/2017   No results found for: "VITAMINB12" No results found for: "TSH"     ASSESSMENT AND PLAN 77 y.o. year old male  has a past medical history of Allergy, Anemia, Arthritis, Ascending aortic aneurysm (HCC) (04/23/2022), Back pain, BPPV (benign paroxysmal positional vertigo), Cancer (HCC), Cataract, Chronic back pain, Chronic diastolic heart failure (HCC)  (04/23/2022), Complication of anesthesia, Constipation, DDD (degenerative disc disease), lumbar, Diabetes mellitus without complication (HCC), Diabetic neuropathy (HCC), ED (erectile dysfunction), Essential hypertension (10/16/2015), Fatty liver, First degree AV block, GERD (gastroesophageal reflux disease), H/O hiatal hernia, History of elevated PSA, Nocturia more than twice per night (12/21/2013), OA (osteoarthritis), Obesity hypoventilation syndrome (HCC) (04/07/2014), Obesity, morbid (HCC) (12/21/2013), OSA on CPAP (04/07/2014), Pure hypercholesterolemia (04/18/2021), RBBB, Sleep apnea, Snoring (12/21/2013), Spondylosis (2014), and Ulcer. here with     ICD-10-CM   1. OSA on CPAP  G47.33 For home use only DME continuous positive airway pressure (CPAP)      Loney is doing well with his new CPAP machine.  Compliance report reveals excellent compliance.  He was encouraged to continue using CPAP nightly and for greater than 4 hours each night. ESS has improved. He will monitor closely. Continue working toward [employer] goals. Healthy lifestyle habits with well-balanced diet regular exercise advised.  He will return to see me in 1 year.  He verbalizes understanding and agreement with this plan.   Orders Placed This Encounter  Procedures   For home use only DME continuous positive airway pressure (CPAP)    Supplies    Order Specific Question:   Length of Need    Answer:   Lifetime    Order Specific Question:   Patient has OSA or probable OSA    Answer:   Yes    Order Specific Question:   Is the patient currently using CPAP in the home    Answer:   Yes    Order Specific  Question:   Settings    Answer:   Other see comments    Order Specific Question:   CPAP supplies needed    Answer:   Mask, headgear, cushions, filters, heated tubing and water chamber      No orders of the defined types were placed in this encounter.       Shawnie Dapper, FNP-C 12/31/2022, 10:17 AM [facility] [address], [address] [place], [place] [address] [phone]

## 2022-12-27 NOTE — Patient Instructions (Signed)

## 2022-12-29 DIAGNOSIS — M19021 Primary osteoarthritis, right elbow: Secondary | ICD-10-CM | POA: Diagnosis not present

## 2022-12-31 ENCOUNTER — Ambulatory Visit: Payer: Medicare PPO | Admitting: Family Medicine

## 2022-12-31 ENCOUNTER — Encounter: Payer: Self-pay | Admitting: Family Medicine

## 2022-12-31 VITALS — BP 132/75 | HR 64 | Ht 73.5 in | Wt 340.0 lb

## 2022-12-31 DIAGNOSIS — G4733 Obstructive sleep apnea (adult) (pediatric): Secondary | ICD-10-CM

## 2023-01-02 DIAGNOSIS — G4733 Obstructive sleep apnea (adult) (pediatric): Secondary | ICD-10-CM | POA: Diagnosis not present

## 2023-01-14 ENCOUNTER — Ambulatory Visit
Admission: RE | Admit: 2023-01-14 | Discharge: 2023-01-14 | Disposition: A | Discharge status: Home / Self Care | Payer: Medicare PPO | Source: Ambulatory Visit | Attending: Cardiothoracic Surgery | Admitting: Cardiothoracic Surgery

## 2023-01-14 ENCOUNTER — Encounter: Payer: Self-pay | Admitting: Cardiothoracic Surgery

## 2023-01-14 ENCOUNTER — Ambulatory Visit: Payer: Medicare PPO | Admitting: Cardiothoracic Surgery

## 2023-01-14 VITALS — BP 143/83 | HR 73 | Resp 20 | Ht 73.0 in | Wt 343.0 lb

## 2023-01-14 DIAGNOSIS — I7121 Aneurysm of the ascending aorta, without rupture: Secondary | ICD-10-CM

## 2023-01-14 DIAGNOSIS — I712 Thoracic aortic aneurysm, without rupture, unspecified: Secondary | ICD-10-CM | POA: Insufficient documentation

## 2023-01-14 DIAGNOSIS — I719 Aortic aneurysm of unspecified site, without rupture: Secondary | ICD-10-CM | POA: Diagnosis not present

## 2023-01-14 MED ORDER — IOPAMIDOL (ISOVUE-370) INJECTION 76%
500.0000 mL | Freq: Once | INTRAVENOUS | Status: AC | PRN
  Administered 2023-01-14: 75 mL via INTRAVENOUS

## 2023-01-14 NOTE — Progress Notes (Signed)
HPI: Very nice 77 year old diabetic with morbid obesity and obstructive sleep apnea who returns with a 75-month surveillance CTA to follow an asymptomatic fusiform ascending aneurysm first noted as an incidental finding. Patient has significant hypertension and is carefully followed by Dr. Duke Salvia who is titrating his meds which include Cardura, Norvasc, losartan, and nebivolol.  He continues to do well without chest pain. Previous echocardiogram within the past year shows normal LV systolic function with a functioning trileaflet aortic valve.  Patient's CTA images from today's personally reviewed and shows no change in the ascending aortic diameter.  Aortic root measures 4.5 cm.  The ascending aorta at the sinotubular junction is 4.5 cm.  The ascending aorta proximal to the innominate artery is 4.7 cm. Aortic wall is not thickened with a hematoma or with ulceration.  Patient is on a weight loss program and is taking Mounjaro which has also helped his diabetic management considerably. He has significant bilateral knee arthritis and is unable to walk normally.  He hopes that his weight reduction will progress so that he can undergo knee replacement in the future.  I told him that his aortic disease is moderate and would not be a contraindication for general anesthesia and knee replacement as long as his blood pressure was adequately controlled going into surgery. Current Outpatient Medications  Medication Sig Dispense Refill   ACCU-CHEK AVIVA PLUS test strip      Accu-Chek Softclix Lancets lancets      amLODipine (NORVASC) 10 MG tablet Take 10 mg by mouth every morning.      aspirin EC 81 MG tablet Take 81 mg by mouth daily.     cyclobenzaprine (FLEXERIL) 5 MG tablet Take 5 mg by mouth as needed for muscle spasms.     doxazosin (CARDURA) 8 MG tablet Take 1 tablet (8 mg total) by mouth 2 (two) times daily. 180 tablet 3   FIBER PO Take 2 tablets by mouth every morning.     gabapentin (NEURONTIN)  600 MG tablet Take 600 mg by mouth 2 (two) times daily.     Glucosamine-Chondroit-Vit C-Mn (GLUCOSAMINE 1500 COMPLEX PO) Take by mouth.     INVOKANA 100 MG TABS tablet      ipratropium (ATROVENT) 0.03 % nasal spray Place 1 spray into both nostrils at bedtime as needed for rhinitis.      linaCLOtide (LINZESS PO) Take by mouth daily.     metFORMIN (GLUCOPHAGE) 500 MG tablet Take 500 mg by mouth daily with breakfast.      mometasone (NASONEX) 50 MCG/ACT nasal spray Place 2 sprays into the nose daily.     montelukast (SINGULAIR) 10 MG tablet Take 10 mg by mouth every morning.     MOUNJARO 15 MG/0.5ML Pen Inject 15 mg into the skin once a week.     Multiple Vitamin (MULTIVITAMIN) capsule Take 1 capsule by mouth daily.     naproxen (NAPROSYN) 500 MG tablet Take 500 mg by mouth 2 (two) times daily with a meal.     Nebivolol HCl 20 MG TABS Take 1 tablet (20 mg total) by mouth daily. 90 tablet 3   rosuvastatin (CRESTOR) 5 MG tablet rosuvastatin 5 mg tablet     valsartan-hydrochlorothiazide (DIOVAN-HCT) 320-25 MG per tablet Take 1 tablet by mouth every morning.     furosemide (LASIX) 40 MG tablet Take 40 mg by mouth 2 (two) times a week.     No current facility-administered medications for this visit.     Physical Exam:Blood pressure Marland Kitchen)  143/83, pulse 73, resp. rate 20, height 6\' 1"  (1.854 m), weight (!) 343 lb (155.6 kg), SpO2 92%.        Exam    General- alert and comfortable    Neck- no JVD, no cervical adenopathy palpable, no carotid bruit   Lungs- clear without rales, wheezes   Cor- regular rate and rhythm, no murmur , gallop   Abdomen- soft, non-tender, obese   Extremities - warm, non-tender, minimal edema, palpable pedal pulses bilaterally   Neuro- oriented, appropriate, no focal weakness   Diagnostic Tests: CTA shows moderate fusiform ascending aneurysm with diameters as noted above.  Impression: Patient is at low risk for dissection/aortic tear at the current thoracic aortic  diameter measurements.  The patient would meet criteria for surgery only if the measurements approached 5.5 cm diameter.  Best therapy now is good blood pressure control and monitoring.  Systolic blood pressure to remain less than 140 is the goal. Continued surveillance scans every 12 months are also indicated. Plan: Patient will return in 12 months for repeat CTA of the thoracic aorta.   Lovett Sox, MD [facility] [phone]

## 2023-01-15 DIAGNOSIS — M1712 Unilateral primary osteoarthritis, left knee: Secondary | ICD-10-CM | POA: Diagnosis not present

## 2023-01-31 DIAGNOSIS — I739 Peripheral vascular disease, unspecified: Secondary | ICD-10-CM | POA: Diagnosis not present

## 2023-01-31 DIAGNOSIS — G629 Polyneuropathy, unspecified: Secondary | ICD-10-CM | POA: Diagnosis not present

## 2023-02-12 DIAGNOSIS — E7849 Other hyperlipidemia: Secondary | ICD-10-CM | POA: Diagnosis not present

## 2023-02-12 DIAGNOSIS — Z1212 Encounter for screening for malignant neoplasm of rectum: Secondary | ICD-10-CM | POA: Diagnosis not present

## 2023-02-12 DIAGNOSIS — Z125 Encounter for screening for malignant neoplasm of prostate: Secondary | ICD-10-CM | POA: Diagnosis not present

## 2023-02-12 DIAGNOSIS — Z1389 Encounter for screening for other disorder: Secondary | ICD-10-CM | POA: Diagnosis not present

## 2023-02-12 DIAGNOSIS — E1140 Type 2 diabetes mellitus with diabetic neuropathy, unspecified: Secondary | ICD-10-CM | POA: Diagnosis not present

## 2023-02-12 DIAGNOSIS — I10 Essential (primary) hypertension: Secondary | ICD-10-CM | POA: Diagnosis not present

## 2023-02-12 DIAGNOSIS — E786 Lipoprotein deficiency: Secondary | ICD-10-CM | POA: Diagnosis not present

## 2023-02-12 DIAGNOSIS — M170 Bilateral primary osteoarthritis of knee: Secondary | ICD-10-CM | POA: Diagnosis not present

## 2023-02-15 DIAGNOSIS — L821 Other seborrheic keratosis: Secondary | ICD-10-CM | POA: Diagnosis not present

## 2023-02-15 DIAGNOSIS — D2262 Melanocytic nevi of left upper limb, including shoulder: Secondary | ICD-10-CM | POA: Diagnosis not present

## 2023-02-15 DIAGNOSIS — Z85820 Personal history of malignant melanoma of skin: Secondary | ICD-10-CM | POA: Diagnosis not present

## 2023-02-15 DIAGNOSIS — D225 Melanocytic nevi of trunk: Secondary | ICD-10-CM | POA: Diagnosis not present

## 2023-02-15 DIAGNOSIS — L918 Other hypertrophic disorders of the skin: Secondary | ICD-10-CM | POA: Diagnosis not present

## 2023-02-15 DIAGNOSIS — L814 Other melanin hyperpigmentation: Secondary | ICD-10-CM | POA: Diagnosis not present

## 2023-02-19 DIAGNOSIS — M170 Bilateral primary osteoarthritis of knee: Secondary | ICD-10-CM | POA: Diagnosis not present

## 2023-02-19 DIAGNOSIS — G629 Polyneuropathy, unspecified: Secondary | ICD-10-CM | POA: Diagnosis not present

## 2023-02-19 DIAGNOSIS — I5189 Other ill-defined heart diseases: Secondary | ICD-10-CM | POA: Diagnosis not present

## 2023-02-19 DIAGNOSIS — I712 Thoracic aortic aneurysm, without rupture, unspecified: Secondary | ICD-10-CM | POA: Diagnosis not present

## 2023-02-19 DIAGNOSIS — M549 Dorsalgia, unspecified: Secondary | ICD-10-CM | POA: Diagnosis not present

## 2023-02-19 DIAGNOSIS — Z23 Encounter for immunization: Secondary | ICD-10-CM | POA: Diagnosis not present

## 2023-02-19 DIAGNOSIS — I10 Essential (primary) hypertension: Secondary | ICD-10-CM | POA: Diagnosis not present

## 2023-02-19 DIAGNOSIS — R82998 Other abnormal findings in urine: Secondary | ICD-10-CM | POA: Diagnosis not present

## 2023-02-19 DIAGNOSIS — E1165 Type 2 diabetes mellitus with hyperglycemia: Secondary | ICD-10-CM | POA: Diagnosis not present

## 2023-02-19 DIAGNOSIS — Z0000 Encounter for general adult medical examination without abnormal findings: Secondary | ICD-10-CM | POA: Diagnosis not present

## 2023-02-19 DIAGNOSIS — E662 Morbid (severe) obesity with alveolar hypoventilation: Secondary | ICD-10-CM | POA: Diagnosis not present

## 2023-02-26 DIAGNOSIS — M170 Bilateral primary osteoarthritis of knee: Secondary | ICD-10-CM | POA: Diagnosis not present

## 2023-03-18 DIAGNOSIS — M1711 Unilateral primary osteoarthritis, right knee: Secondary | ICD-10-CM | POA: Diagnosis not present

## 2023-03-22 DIAGNOSIS — D3131 Benign neoplasm of right choroid: Secondary | ICD-10-CM | POA: Diagnosis not present

## 2023-03-22 DIAGNOSIS — H35371 Puckering of macula, right eye: Secondary | ICD-10-CM | POA: Diagnosis not present

## 2023-03-22 DIAGNOSIS — H52201 Unspecified astigmatism, right eye: Secondary | ICD-10-CM | POA: Diagnosis not present

## 2023-03-22 DIAGNOSIS — E119 Type 2 diabetes mellitus without complications: Secondary | ICD-10-CM | POA: Diagnosis not present

## 2023-03-26 DIAGNOSIS — M5416 Radiculopathy, lumbar region: Secondary | ICD-10-CM | POA: Diagnosis not present

## 2023-04-02 DIAGNOSIS — G4733 Obstructive sleep apnea (adult) (pediatric): Secondary | ICD-10-CM | POA: Diagnosis not present

## 2023-04-15 DIAGNOSIS — M961 Postlaminectomy syndrome, not elsewhere classified: Secondary | ICD-10-CM | POA: Diagnosis not present

## 2023-04-15 DIAGNOSIS — M5416 Radiculopathy, lumbar region: Secondary | ICD-10-CM | POA: Diagnosis not present

## 2023-04-15 DIAGNOSIS — M539 Dorsopathy, unspecified: Secondary | ICD-10-CM | POA: Diagnosis not present

## 2023-04-16 ENCOUNTER — Encounter (HOSPITAL_BASED_OUTPATIENT_CLINIC_OR_DEPARTMENT_OTHER): Payer: Self-pay | Admitting: Family

## 2023-04-16 ENCOUNTER — Ambulatory Visit (HOSPITAL_BASED_OUTPATIENT_CLINIC_OR_DEPARTMENT_OTHER): Payer: Medicare PPO | Admitting: Family

## 2023-04-16 VITALS — BP 130/76 | HR 68 | Ht 73.5 in | Wt 335.8 lb

## 2023-04-16 DIAGNOSIS — I7121 Aneurysm of the ascending aorta, without rupture: Secondary | ICD-10-CM

## 2023-04-16 DIAGNOSIS — I452 Bifascicular block: Secondary | ICD-10-CM

## 2023-04-16 DIAGNOSIS — I10 Essential (primary) hypertension: Secondary | ICD-10-CM | POA: Diagnosis not present

## 2023-04-16 DIAGNOSIS — G4733 Obstructive sleep apnea (adult) (pediatric): Secondary | ICD-10-CM | POA: Diagnosis not present

## 2023-04-16 DIAGNOSIS — I5032 Chronic diastolic (congestive) heart failure: Secondary | ICD-10-CM

## 2023-04-16 DIAGNOSIS — E782 Mixed hyperlipidemia: Secondary | ICD-10-CM | POA: Diagnosis not present

## 2023-04-16 DIAGNOSIS — M1712 Unilateral primary osteoarthritis, left knee: Secondary | ICD-10-CM | POA: Diagnosis not present

## 2023-04-16 NOTE — Progress Notes (Signed)
Cardiology Office Note:  .   Date:  04/16/2023  ID:  Tommy [name], DOB 24-May-1945, MRN [id number] PCP: Creola Corn, MD  [facility] Providers Cardiologist:  Chilton Si, MD    History of Present Illness: .   Tommy [name] is a 77 y.o. male with history of asthma, HTN, GERD, fatty liver disease, OSA on CPAP, HFpEF, ascending aortic aneurysm, morbid obesity, aortic atherosclerosis.   Prior workup myoview 10/2015 negative for ischemia, but Ef reported 30-44%. Subsequent echo normal LVEf and mild LVH. Diastolic function abnormal but degree of dysfunction not reported.   Doxazosin previously increased to 6mg  BID. Echo 08/2020 LVEF 60-65%, gr1dd, ascending aorta 4.5 unchanged from 2017.   Seen 04/2022 with successful 30 lbs weight loss on Mounjaro. Due to LE edema, repeat echo 04/2022 LVEF 55-60^, mild LVH, gr1DD, trivial AI/MR, ascending aortic aneurysm 49 mm, aneurysm of aortic root 45 mm, ER aneurysm of the aortic arch 46 mm.  CTA chest/aorta 06/2022 revealed ascending thoracic aneurysm 5.9 cm, indeterminate 16mm left adrenal nodule probable for adrenal adenoma, small hiatal hernia.  He was referred to cardiothoracic surgery.  Last seen 10/23/22 continue to follow with [facility] regarding ascending aortic aneurysm.  Doxazosin increased to 8 mg twice daily for BP control.  Nebivolol, valsartan-HCTZ, amlodipine were continued.  Lasix once per week and as needed for fluid management was continued.  Seen by cardiothoracic surgery 01/14/2023 with CT images aortic root 4.5 cm, ascending aorta 4.5 cm, ascending aorta 4.7 cm.  Will meet criteria for surgery only if measurements are placed 5.5 cm diameter.  Was recommended for continued BP control and repeat CTA in 1 year.  Today for follow-up independently. Notes tolerating Mouynjaro well but has hit a plateau. Recently got gel injections in bilateral knees and is hopeful to tolerate more activity. Exercise has been limited by back pain  with prior surgeries.  Average systolic BP over the last month of 134 when checked prior to medications. Wearing CPAP regularly.   ROS: Please see the history of present illness.    All other systems reviewed and are negative.   Studies Reviewed: Marland Kitchen   EKG Interpretation Date/Time:  Tuesday April 16 2023 09:04:23 EST Ventricular Rate:  68 PR Interval:  230 QRS Duration:  192 QT Interval:  486 QTC Calculation: 516 R Axis:   -81  Text Interpretation: Sinus rhythm with 1st degree A-V block Right bundle branch block Left anterior fascicular block Bifascicular block Stable compared to previous Confirmed by Gillian Shields ([id number]) on 04/16/2023 9:07:03 AM    Cardiac Studies & Procedures     STRESS TESTS  MYOCARDIAL PERFUSION IMAGING 11/03/2015  Narrative  Nuclear stress EF: 44%.  There was no ST segment deviation noted during stress.  No T wave inversion was noted during stress.  Defect 1: There is a small defect of moderate severity present in the apex location. Apical thinning vs prior infarct.  This is an intermediate risk study due to reduced LVEF.  The left ventricular ejection fraction is moderately decreased (30-44%).  No ischemia.   ECHOCARDIOGRAM  ECHOCARDIOGRAM COMPLETE 05/10/2022  Narrative ECHOCARDIOGRAM REPORT    Patient Name:   Tommy [name] Date of Exam: 05/10/2022 Medical Rec #:  [id number]            Height:       73.0 in Accession #:    [id number]           Weight:       340.0 lb  Date of Birth:  03-20-1946            BSA:          2.696 m Patient Age:    19 years             BP:           140/75 mmHg Patient Gender: M                    HR:           71 bpm. Exam Location:  Outpatient  Procedure: 2D Echo, 3D Echo, Color Doppler, Cardiac Doppler and Strain Analysis  Indications:     Ascending aortic aneurysm  History:         Patient has prior history of Echocardiogram examinations, most recent 09/01/2020. Risk Factors:Hypertension,  Non-Smoker, Dyslipidemia and Diabetes. RBBB; Ascending aortic aneurysm.  Sonographer:     Jeryl Columbia RDCS Referring Phys:  [id number] [facility] [place] Diagnosing Phys: Epifanio Lesches MD  IMPRESSIONS   1. Left ventricular ejection fraction, by estimation, is 55 to 60%. The left ventricle has normal function. The left ventricle has no regional wall motion abnormalities. The left ventricular internal cavity size was mildly dilated. There is mild left ventricular hypertrophy. Left ventricular diastolic parameters are consistent with Grade I diastolic dysfunction (impaired relaxation). 2. Right ventricular systolic function is normal. The right ventricular size is normal. Tricuspid regurgitation signal is inadequate for assessing PA pressure. 3. The mitral valve is normal in structure. Trivial mitral valve regurgitation. 4. The aortic valve is tricuspid. Aortic valve regurgitation is trivial. No aortic stenosis is present. 5. The inferior vena cava is dilated in size with >50% respiratory variability, suggesting right atrial pressure of 8 mmHg. 6. Aortic dilatation noted. Aneurysm of the ascending aorta, measuring 49 mm. Aneurysm of the aortic root, measuring 45 mm. Aneurysm of aortic arch measuring 46mm.  FINDINGS Left Ventricle: Left ventricular ejection fraction, by estimation, is 55 to 60%. The left ventricle has normal function. The left ventricle has no regional wall motion abnormalities. The left ventricular internal cavity size was mildly dilated. There is mild left ventricular hypertrophy. Left ventricular diastolic parameters are consistent with Grade I diastolic dysfunction (impaired relaxation).  Right Ventricle: The right ventricular size is normal. No increase in right ventricular wall thickness. Right ventricular systolic function is normal. Tricuspid regurgitation signal is inadequate for assessing PA pressure.  Left Atrium: Left atrial size was normal in size.  Right  Atrium: Right atrial size was normal in size.  Pericardium: There is no evidence of pericardial effusion.  Mitral Valve: The mitral valve is normal in structure. Trivial mitral valve regurgitation.  Tricuspid Valve: The tricuspid valve is normal in structure. Tricuspid valve regurgitation is trivial.  Aortic Valve: The aortic valve is tricuspid. Aortic valve regurgitation is trivial. Aortic regurgitation PHT measures 452 msec. No aortic stenosis is present.  Pulmonic Valve: The pulmonic valve was not well visualized. Pulmonic valve regurgitation is not visualized.  Aorta: Aortic dilatation noted. There is an aneurysm involving the ascending aorta measuring 49 mm. There is an aneurysm involving the aortic root measuring 45 mm.  Venous: The inferior vena cava is dilated in size with greater than 50% respiratory variability, suggesting right atrial pressure of 8 mmHg.  IAS/Shunts: The interatrial septum was not well visualized.   LEFT VENTRICLE PLAX 2D LVIDd:         6.08 cm   Diastology LVIDs:  4.56 cm   LV e' medial:    5.77 cm/s LV PW:         1.51 cm   LV E/e' medial:  11.3 LV IVS:        1.27 cm   LV e' lateral:   9.95 cm/s LVOT diam:     2.40 cm   LV E/e' lateral: 6.5 LV SV:         110 LV SV Index:   41 LVOT Area:     4.52 cm  3D Volume EF: 3D EF:        54 % LV EDV:       263 ml LV ESV:       122 ml LV SV:        141 ml  RIGHT VENTRICLE RV Basal diam:  5.15 cm RV Mid diam:    4.69 cm RV S prime:     10.70 cm/s TAPSE (M-mode): 2.2 cm  LEFT ATRIUM             Index        RIGHT ATRIUM           Index LA diam:        4.80 cm 1.78 cm/m   RA Area:     15.00 cm LA Vol (A2C):   88.8 ml 32.94 ml/m  RA Volume:   29.60 ml  10.98 ml/m LA Vol (A4C):   53.5 ml 19.84 ml/m LA Biplane Vol: 73.7 ml 27.34 ml/m AORTIC VALVE LVOT Vmax:   107.00 cm/s LVOT Vmean:  71.000 cm/s LVOT VTI:    0.243 m AI PHT:      452 msec  AORTA Ao Root diam: 3.45 cm Ao Asc diam:  4.80  cm  MITRAL VALVE MV Area (PHT): 3.53 cm     SHUNTS MV Decel Time: 215 msec     Systemic VTI:  0.24 m MV E velocity: 65.00 cm/s   Systemic Diam: 2.40 cm MV A velocity: 110.00 cm/s MV E/A ratio:  0.59  Epifanio Lesches MD Electronically signed by Epifanio Lesches MD Signature Date/Time: 05/10/2022/11:52:36 AM    Final (Updated)             Risk Assessment/Calculations:             Physical Exam:   VS:  BP 130/76 (BP Location: Left Arm, Patient Position: Sitting, Cuff Size: Large)   Pulse 68   Ht 6' 1.5" (1.867 m)   Wt (!) 335 lb 12.8 oz (152.3 kg)   SpO2 93%   BMI 43.70 kg/m    Wt Readings from Last 3 Encounters:  04/16/23 (!) 335 lb 12.8 oz (152.3 kg)  01/14/23 (!) 343 lb (155.6 kg)  12/31/22 (!) 340 lb (154.2 kg)    GEN: Well nourished, overweight, well developed in no acute distress NECK: No JVD; No carotid bruits CARDIAC: RRR, no murmurs, rubs, gallops RESPIRATORY:  Clear to auscultation without rales, wheezing or rhonchi  ABDOMEN: Soft, non-tender, non-distended EXTREMITIES:  No edema; No deformity   ASSESSMENT AND PLAN: .    Ascending aortic aneurysm- Follows with [facility]. Last seen 12/2022 recommended for CT in 1 year and optimal BP control. Defer BB due to bifasicular heart block.   HFpEF/HTN- BP well controlled. Continue current antihypertensive regimen.  Home BP prior to medications on average SBP 134. Grossly euvolemic on exam. Continue Lasix PRN.   HLD /aortic atherosclerosis- 01/2023 LDL 34. Continue Rosuvastatin.   Morbid obesity- Weight loss  via diet and exercise encouraged. Discussed the impact being overweight would have on cardiovascular risk. Good success on Mounjaro.   OSA- CPAP compliance encouraged. Endorses wearing regularly.    Bifascicular block-stable RBBB and LAFB by EKG.  Continue to monitor with periodic EKG.  Would avoid AV nodal blocking therapy.       Dispo: follow up in 6 months  Signed, Alver Sorrow, NP

## 2023-04-16 NOTE — Patient Instructions (Signed)
Medication Instructions:  Continue your current medications.   *If you need a refill on your cardiac medications before your next appointment, please call your pharmacy*  Testing/Procedures: Your EKG today was stable from previous.   Follow-Up: At [facility], you and your health needs are our priority.  As part of our continuing mission to provide you with exceptional heart care, we have created designated Provider Care Teams.  These Care Teams include your primary Cardiologist (physician) and Advanced Practice Providers (APPs -  Physician Assistants and Nurse Practitioners) who all work together to provide you with the care you need, when you need it.  We recommend signing up for the patient portal called "MyChart".  Sign up information is provided on this After Visit Summary.  MyChart is used to connect with patients for Virtual Visits (Telemedicine).  Patients are able to view lab/test results, encounter notes, upcoming appointments, etc.  Non-urgent messages can be sent to your provider as well.   To learn more about what you can do with MyChart, go to [email or web address].    Your next appointment:   6 month(s)  Provider:   Chilton Si, MD or Gillian Shields, NP    Other Instructions  If your  blood pressure is consistently more than 140 at home prior to medications, please call and let us know.   Heart Healthy Diet Recommendations: A low-salt diet is recommended. Meats should be grilled, baked, or boiled. Avoid fried foods. Focus on lean protein sources like fish or chicken with vegetables and fruits. The American Heart Association is a Chief Technology Officer!  American Heart Association Diet and Lifeystyle Recommendations   Exercise recommendations: The American Heart Association recommends 150 minutes of moderate intensity exercise weekly. Try 30 minutes of moderate intensity exercise 4-5 times per week. This could include walking, jogging, or swimming.

## 2023-05-01 DIAGNOSIS — H26492 Other secondary cataract, left eye: Secondary | ICD-10-CM | POA: Diagnosis not present

## 2023-06-03 ENCOUNTER — Encounter: Payer: Self-pay | Admitting: Podiatry

## 2023-06-03 ENCOUNTER — Ambulatory Visit: Payer: Medicare PPO | Admitting: Podiatry

## 2023-06-03 VITALS — Ht 71.5 in | Wt 335.8 lb

## 2023-06-03 DIAGNOSIS — E119 Type 2 diabetes mellitus without complications: Secondary | ICD-10-CM

## 2023-06-03 DIAGNOSIS — M79674 Pain in right toe(s): Secondary | ICD-10-CM

## 2023-06-03 DIAGNOSIS — R269 Unspecified abnormalities of gait and mobility: Secondary | ICD-10-CM | POA: Diagnosis not present

## 2023-06-03 DIAGNOSIS — G6289 Other specified polyneuropathies: Secondary | ICD-10-CM | POA: Diagnosis not present

## 2023-06-03 DIAGNOSIS — B351 Tinea unguium: Secondary | ICD-10-CM

## 2023-06-03 DIAGNOSIS — M79675 Pain in left toe(s): Secondary | ICD-10-CM

## 2023-06-03 DIAGNOSIS — L84 Corns and callosities: Secondary | ICD-10-CM | POA: Diagnosis not present

## 2023-06-03 NOTE — Progress Notes (Signed)
  Subjective:  Patient ID: Tommy [name], male    DOB: 1946-01-26,  MRN: [id number]  Chief Complaint  Patient presents with   Diabetes    He is here to re-establish and reports  I have a callus on the left foot and would like a nail trim and I have noticed more numbness and more sensitivity in my feet . He reports he had an MRI recently and did not show any change.     78 y.o. male presents with the above complaint. History confirmed with patient.  His diabetes is well-controlled, his A1c is in the 5% range after he started Mounjaro .  Does not know if he had his B12 or folate ever checked.  Objective:  Physical Exam: warm, good capillary refill, no trophic changes or ulcerative lesions, normal DP and PT pulses, and reduced sensation at plantar forefoot to monofilament bilaterally and multiple areas of scattered appreciation.  Submetatarsal 5 callus left foot.  Thickened elongated dystrophic toenails with subungual debris x 10  Assessment:   1. Other polyneuropathy   2. Gait disturbance   3. Pain due to onychomycosis of toenails of both feet   4. Callus of foot   5. Non-insulin  dependent type 2 diabetes mellitus (HCC)      Plan:  Patient was evaluated and treated and all questions answered.  Patient educated on diabetes. Discussed proper diabetic foot care and discussed risks and complications of disease. Educated patient in depth on reasons to return to the office immediately should he/she discover anything concerning or new on the feet. All questions answered. Discussed proper shoes as well.   Suspect he has multifactorial polyneuropathy.  I discussed with him that he may increase his nighttime gabapentin to 900 mg as this is the most symptomatic time for him.  We also discussed possibility of B12 or folate deficiency and he will have his level checked.  Most impactful he has balance issues from his neuropathy as well and I recommended physical therapy.  Referral placed.  All  symptomatic hyperkeratoses were safely debrided with a sterile #15 blade to patient's level of comfort without incident. We discussed preventative and palliative care of these lesions including supportive and accommodative shoegear, padding, prefabricated and custom molded accommodative orthoses, use of a pumice stone and lotions/creams daily.  Discussed the etiology and treatment options for the condition in detail with the patient. Recommended debridement of the nails today. Sharp and mechanical debridement performed of all painful and mycotic nails today. Nails debrided in length and thickness using a nail nipper to level of comfort. Discussed treatment options including appropriate shoe gear. Follow up as needed for painful nails.    Return in about 3 months (around 09/01/2023) for at risk diabetic foot care.

## 2023-06-04 ENCOUNTER — Other Ambulatory Visit (HOSPITAL_COMMUNITY): Payer: Self-pay

## 2023-06-10 ENCOUNTER — Ambulatory Visit: Payer: Medicare PPO | Attending: Podiatry | Admitting: Physical Therapy

## 2023-06-10 ENCOUNTER — Other Ambulatory Visit: Payer: Self-pay

## 2023-06-10 ENCOUNTER — Encounter: Payer: Self-pay | Admitting: Physical Therapy

## 2023-06-10 VITALS — BP 117/61 | HR 70

## 2023-06-10 DIAGNOSIS — R42 Dizziness and giddiness: Secondary | ICD-10-CM | POA: Insufficient documentation

## 2023-06-10 DIAGNOSIS — R269 Unspecified abnormalities of gait and mobility: Secondary | ICD-10-CM | POA: Insufficient documentation

## 2023-06-10 DIAGNOSIS — M25562 Pain in left knee: Secondary | ICD-10-CM | POA: Insufficient documentation

## 2023-06-10 DIAGNOSIS — M25561 Pain in right knee: Secondary | ICD-10-CM | POA: Insufficient documentation

## 2023-06-10 DIAGNOSIS — R2681 Unsteadiness on feet: Secondary | ICD-10-CM | POA: Diagnosis not present

## 2023-06-10 DIAGNOSIS — G8929 Other chronic pain: Secondary | ICD-10-CM | POA: Diagnosis not present

## 2023-06-10 DIAGNOSIS — R296 Repeated falls: Secondary | ICD-10-CM | POA: Diagnosis not present

## 2023-06-10 DIAGNOSIS — M5459 Other low back pain: Secondary | ICD-10-CM | POA: Insufficient documentation

## 2023-06-10 NOTE — Therapy (Signed)
OUTPATIENT PHYSICAL THERAPY NEURO EVALUATION   Patient Name: Tommy [name] MRN: [id number] DOB:[birth year], 78 y.o., male Today's Date: 06/10/2023   PCP: Tommy Corn, MD REFERRING PROVIDER: Edwin [name], DPM  END OF SESSION:  PT End of Session - 06/10/23 0805     Visit Number 1    Number of Visits 7    Date for PT Re-Evaluation 08/05/23    Authorization Type [employer]    PT Start Time 0803    PT Stop Time 0845    PT Time Calculation (min) 42 min    Equipment Utilized During Treatment Other (comment)   not used as balance not tested in today's session, recommend for higher level balance work   Activity Tolerance Patient tolerated treatment well    Behavior During Therapy WFL for tasks assessed/performed             Past Medical History:  Diagnosis Date   Allergy    Anemia    "as teenager"   Arthritis    hands, hip   Ascending aortic aneurysm (HCC) 04/23/2022   Back pain    BPPV (benign paroxysmal positional vertigo)    History of   Cancer (HCC)    "melanoma removed from leg years ago", malignant mole   Cataract    Chronic back pain    Chronic diastolic heart failure (HCC) 04/23/2022   Complication of anesthesia    woke up and was confused and combative after back surgery   Constipation    DDD (degenerative disc disease), lumbar    Diabetes mellitus without complication (HCC)    Diabetic neuropathy (HCC)    ED (erectile dysfunction)    Essential hypertension 10/16/2015   Fatty liver    First degree AV block    EKG 12/03/16   GERD (gastroesophageal reflux disease)    diet controlled, no meds currently, history of   H/O hiatal hernia    History of elevated PSA    Nocturia more than twice per night 12/21/2013   OA (osteoarthritis)    Obesity hypoventilation syndrome (HCC) 04/07/2014   Obesity, morbid (HCC) 12/21/2013   OSA on CPAP 04/07/2014   Pure hypercholesterolemia 04/18/2021   RBBB    previous EKG   Sleep apnea    uses CPAP nightly    Snoring 12/21/2013   Spondylosis 2014   lumbar back, noted on MR    Ulcer    stomach ulcer "as teenager"   Past Surgical History:  Procedure Laterality Date   BACK SURGERY     "done approx. 24 years ago"   CARDIOVASCULAR STRESS TEST     "done approx 10 years ago, Dr. Timothy [name]"   COLONOSCOPY  2006   hx of "with removal of polyps"/[facility]   COLONOSCOPY WITH PROPOFOL N/A 09/06/2015   Procedure: COLONOSCOPY WITH PROPOFOL;  Surgeon: Tommy Dare, MD;  Location: [facility];  Service: Endoscopy;  Laterality: N/A;   lipoma removed     from leg   LUMBAR LAMINECTOMY/DECOMPRESSION MICRODISCECTOMY  06/19/2012   Procedure: LUMBAR LAMINECTOMY/DECOMPRESSION MICRODISCECTOMY 1 LEVEL;  Surgeon: Tommy Lick, MD;  Location: [facility];  Service: Orthopedics;  Laterality: Left;  L2-3 LEFT MICRODISCECTOMY   thigh surgery     fatty deposit   TONSILLECTOMY     TOTAL HIP ARTHROPLASTY Right 06/05/2017   Procedure: RIGHT TOTAL HIP ARTHROPLASTY ANTERIOR APPROACH;  Surgeon: Tommy Gross, MD;  Location: [facility];  Service: Orthopedics;  Laterality: Right;  Dr. requested 120 minutes   WISDOM TOOTH EXTRACTION  x 1 tooth   Patient Active Problem List   Diagnosis Date Noted   Thoracic aortic aneurysm without rupture (HCC) 01/14/2023   Chronic diastolic heart failure (HCC) 04/23/2022   Ascending aortic aneurysm (HCC) 04/23/2022   Pure hypercholesterolemia 04/18/2021   Non-insulin dependent type 2 diabetes mellitus (HCC) 07/22/2018   RBBB (right bundle branch block) 10/10/2017   History of revision of total replacement of right hip joint 06/18/2017   OA (osteoarthritis) of hip 06/05/2017   Essential hypertension 10/16/2015   Hx of adenomatous colonic polyps    Benign neoplasm of transverse colon    Obesity hypoventilation syndrome (HCC) 04/07/2014   OSA on CPAP 04/07/2014   Obesity, morbid (HCC) 12/21/2013   Nocturia more than twice per night 12/21/2013   Intrinsic asthma 12/21/2013   Lumbar disc  herniation with radiculopathy 06/13/2012    ONSET DATE: 06/03/2023 (referral date)   REFERRING DIAG: R26.9 (ICD-10-CM) - Gait disturbance  THERAPY DIAG:  Abnormality of gait and mobility - Plan: PT plan of care cert/re-cert  Unsteadiness on feet - Plan: PT plan of care cert/re-cert  Other low back pain - Plan: PT plan of care cert/re-cert  Dizziness and giddiness - Plan: PT plan of care cert/re-cert  Chronic pain of both knees - Plan: PT plan of care cert/re-cert  Repeated falls - Plan: PT plan of care cert/re-cert  Rationale for Evaluation and Treatment: Rehabilitation  SUBJECTIVE:                                                                                                                                                                                             SUBJECTIVE STATEMENT: Patient goes by "Tommy [name]."   Patient reports that prior to his back surgery in 2016, patient was walking 2-3 miles 6-7 days a week. Since his surgery and following hip surgery, patient has started notice increased balance issues and hasn't been able to move well since then. Patient reports since these surgeries. He has had a lot of numbness in his lower legs. Patient reports that his doctor thought it may be related to nerve injury following surgery. Patient reports since the onset of  the numbness he feels at a higher risk for falls. He feels especially unsteady when walking down a hill and often carries a walking stick to improve his sense of safety. He is hoping to improve safety with walking. He is also hoping to get both knees replaced but has to lose more weight. He is currently doing injections in both knees. Patient reports that his R hip has been popping a lot recently and plans to follow up with ortho  doctor next week about this. Denies pain with the popping.   Patient does wear a back brace when walking a good distance.    Pt accompanied by: self - drives self  PERTINENT HISTORY: suspected  polyneuropathy, chronic diastolic heart failure, DMII, vertigo. Aortic aneurysm being monitored and stable at this time  PAIN:  Are you having pain?  None currently - reports pain intermittently in bilateral knees and some tightness   PRECAUTIONS: Fall  RED FLAGS: None   WEIGHT BEARING RESTRICTIONS: No  FALLS: Has patient fallen in last 6 months? Yes. 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  DIAGNOSTIC FINDINGS:  01/14/2023 CT Aortic Aneurysm:   IMPRESSION: Grossly stable 4.9 cm Ascending thoracic aortic aneurysm. Recommend semi-annual imaging followup by CTA or MRA and referral to cardiothoracic surgery if not already obtained. This recommendation follows 2010 ACCF/AHA/AATS/ACR/ASA/SCA/SCAI/SIR/STS/SVM Guidelines for the Diagnosis and Management of Patients With Thoracic Aortic Disease. Circulation. 2010; 121: W098-J191. Aortic aneurysm NOS (ICD10-I71.9).   Stable 1.6 cm left adrenal nodule is noted. Attention to this on follow-up imaging is recommended.  COGNITION: Overall cognitive status: Within functional limits for tasks assessed   SENSATION: Reports numbness and tingling below legs   COORDINATION: Grossly WFL - limited by sensation deficits functionally   EDEMA:  1+ edema typically RLE > LLE  MUSCLE TONE: Grossly WFL - reports increased  muscle tightness   POSTURE: rounded shoulders and forward head  LOWER EXTREMITY ROM:     Grossly reduced in LE, hip flexors through ~80% of ROM, tighness in hamstrings and calves  LOWER EXTREMITY MMT:    MMT Right Eval Left Eval  Hip flexion 4/5 4/5  Hip extension    Hip abduction 4/5 4/5  Hip adduction 4/5 4/5  Hip internal rotation    Hip external rotation    Knee flexion 4/5 4/5  Knee extension 4/5 4/5  Ankle dorsiflexion 4/5 4/5  Ankle plantarflexion    Ankle inversion    Ankle eversion    (Blank rows = not tested)  BED MOBILITY:  Reports modified independence getting into bed   TRANSFERS: Assistive device utilized: None  Sit to stand: Modified independence Stand to sit: Modified independence Chair to chair: SBA Floor: Not assessed on eval - limited by knee pain  GAIT: Gait pattern: step through pattern, decreased hip/knee flexion- Right, decreased hip/knee flexion- Left, decreased ankle dorsiflexion- Right, decreased ankle dorsiflexion- Left, and wide BOS Distance walked: various clinic distances Assistive device utilized: None Level of assistance: SBA Comments: mild signs of instability, slow and WBOS  FUNCTIONAL TESTS:    [facility] PT Assessment - 06/10/23 0001       Standardized Balance Assessment   Standardized Balance Assessment Five Times Sit to Stand;10 meter walk test    Five times sit to stand comments  19.66   seconds without UE use (SPO2  dropped from 96% to 90%, recovered with seated break)   10 Meter Walk 0.78 m/s   without AD (SBA - SPO2 at 93%)            PATIENT SURVEYS:  Not performed                                                                                                                      TREATMENT DATE:    Initial Eval only  PATIENT EDUCATION: Education details: POC, goal collaboration, examination findings, SPO2 safety and need to modify POC if oxygen level not staying above 90% with activity  Person educated:  Patient Education method: Explanation Education comprehension: verbalized understanding  HOME EXERCISE PROGRAM: Recommend increasing short distance walking 1 lap around home every hour   GOALS: Goals reviewed with patient? Yes  SHORT TERM GOALS: Target date: 07/01/2023  Patient will demonstrate independence with initial HEP to continue to progress between physical therapy sessions.   Baseline: To be provided  Goal status: INITIAL  2. to be assessed and LTG written Baseline: To be assessed  Goal status: INITIAL  3.  FGA to be assessed and LTG written Baseline: To be assessed  Goal status: INITIAL   LONG TERM GOALS: Target date: 08/05/2023  Patient will report demonstrate independence with final HEP in order to maintain current gains and continue to progress after physical therapy discharge.   Baseline: To be provided  Goal status: INITIAL  2.  Patient will improve gait speed to 0.88 m/s with LRAD to indicate improvement to the level of community ambulator in order to participate more easily in activities outside of the home.   Baseline: 0.78 m/s without AD Goal status: INITIAL  3.  Patient will improve their 5x Sit to Stand score to less than 16 seconds with SPO2 90% or above to demonstrate a decreased risk for falls and improved LE strength.   Baseline: 19.66 seconds without UE and SPO2 at 90%  Goal status: INITIAL  4.  to be assessed and LTG written Baseline: To be assessed  Goal status: INITIAL  5.  FGA to be assessed and LTG written Baseline: To be assessed  Goal status: INITIAL  ASSESSMENT:  CLINICAL IMPRESSION: Patient is a 78 y.o. male who was seen today for physical therapy evaluation and treatment for gait impairments secondary to lumbar surgery and suspected polyneuropathy. Patient also with PMH of complex cardiac nature including heart failure and stable aortic aneurysm as well as morbid obesity. Patient also reports that he is currently in need of  knee replacements and working to lose weight in order to be elligble. Patient presents with impairments including abnormal sensation, lower extremity weakness, decreased ROM, decreased cardiorespiratory endurance, and high risk for falls as indicated by 5xSTS and gait speed. Patient notably did drop to 90% SPO2 with 5xSTS but was stable at 93-96% remainder of session; discussed POC may need to be modified if oxygen levels drop below safe rages and patient verbalized understanding.  Patient will benefit from skilled physical therapy services in order to address impairments, improve safety, and progress towards LTG of knee surgery.    OBJECTIVE IMPAIRMENTS: Abnormal gait, decreased balance, decreased endurance, difficulty walking, decreased ROM, decreased strength, dizziness, obesity, and pain.   ACTIVITY LIMITATIONS: carrying, bending, and dressing  PARTICIPATION LIMITATIONS: community activity and yard work  PERSONAL FACTORS: Age, Past/current experiences, Time since onset of injury/illness/exacerbation, and 3+ comorbidities: see above  are also affecting patient's functional outcome.   REHAB POTENTIAL: Fair limited by multiple commodities and SPO2 fluctuations  CLINICAL DECISION MAKING: Evolving/moderate complexity  EVALUATION COMPLEXITY: Moderate  PLAN:  PT FREQUENCY: 1x/week  PT DURATION: 6 weeks  PLANNED INTERVENTIONS: [id number]- PT Re-evaluation, 97110-Therapeutic exercises, 97530- Therapeutic activity, 97112- Neuromuscular re-education, 97535- Self Care, 29562- Manual therapy, [id number]- Gait training, [phone]- Canalith repositioning, and Dry Needling  PLAN FOR NEXT SESSION: assess and FGA, work on balance HEP with funcitonal strength and progress walking program, closely monitor SPO2   Carmelia Bake, PT, DPT 06/10/2023, 9:16 AM

## 2023-06-11 DIAGNOSIS — E662 Morbid (severe) obesity with alveolar hypoventilation: Secondary | ICD-10-CM | POA: Diagnosis not present

## 2023-06-11 DIAGNOSIS — E1140 Type 2 diabetes mellitus with diabetic neuropathy, unspecified: Secondary | ICD-10-CM | POA: Diagnosis not present

## 2023-06-11 DIAGNOSIS — E786 Lipoprotein deficiency: Secondary | ICD-10-CM | POA: Diagnosis not present

## 2023-06-11 DIAGNOSIS — I10 Essential (primary) hypertension: Secondary | ICD-10-CM | POA: Diagnosis not present

## 2023-06-11 DIAGNOSIS — G4733 Obstructive sleep apnea (adult) (pediatric): Secondary | ICD-10-CM | POA: Diagnosis not present

## 2023-06-11 DIAGNOSIS — K760 Fatty (change of) liver, not elsewhere classified: Secondary | ICD-10-CM | POA: Diagnosis not present

## 2023-06-17 DIAGNOSIS — M1711 Unilateral primary osteoarthritis, right knee: Secondary | ICD-10-CM | POA: Diagnosis not present

## 2023-06-18 ENCOUNTER — Ambulatory Visit: Payer: Medicare PPO | Admitting: Physical Therapy

## 2023-06-18 VITALS — BP 148/81 | HR 74

## 2023-06-18 DIAGNOSIS — R2681 Unsteadiness on feet: Secondary | ICD-10-CM

## 2023-06-18 DIAGNOSIS — R269 Unspecified abnormalities of gait and mobility: Secondary | ICD-10-CM | POA: Diagnosis not present

## 2023-06-18 DIAGNOSIS — R42 Dizziness and giddiness: Secondary | ICD-10-CM

## 2023-06-18 DIAGNOSIS — M5459 Other low back pain: Secondary | ICD-10-CM | POA: Diagnosis not present

## 2023-06-18 DIAGNOSIS — R296 Repeated falls: Secondary | ICD-10-CM | POA: Diagnosis not present

## 2023-06-18 DIAGNOSIS — M25562 Pain in left knee: Secondary | ICD-10-CM | POA: Diagnosis not present

## 2023-06-18 DIAGNOSIS — G8929 Other chronic pain: Secondary | ICD-10-CM | POA: Diagnosis not present

## 2023-06-18 DIAGNOSIS — M25561 Pain in right knee: Secondary | ICD-10-CM | POA: Diagnosis not present

## 2023-06-18 NOTE — Therapy (Signed)
OUTPATIENT PHYSICAL THERAPY NEURO TREATMENT   Patient Name: Tommy [name] MRN: [id number] DOB:January 09, 1946, 78 y.o., male Today's Date: 06/18/2023   PCP: Creola Corn, MD REFERRING PROVIDER: Edwin Cap, DPM  END OF SESSION:  PT End of Session - 06/18/23 0857     Visit Number 2    Number of Visits 7    Date for PT Re-Evaluation 08/05/23    Authorization Type [employer]    PT Start Time 0800    PT Stop Time 0846    PT Time Calculation (min) 46 min    Equipment Utilized During Treatment Gait belt    Activity Tolerance Patient tolerated treatment well    Behavior During Therapy WFL for tasks assessed/performed              Past Medical History:  Diagnosis Date   Allergy    Anemia    "as teenager"   Arthritis    hands, hip   Ascending aortic aneurysm (HCC) 04/23/2022   Back pain    BPPV (benign paroxysmal positional vertigo)    History of   Cancer (HCC)    "melanoma removed from leg years ago", malignant mole   Cataract    Chronic back pain    Chronic diastolic heart failure (HCC) 04/23/2022   Complication of anesthesia    woke up and was confused and combative after back surgery   Constipation    DDD (degenerative disc disease), lumbar    Diabetes mellitus without complication (HCC)    Diabetic neuropathy (HCC)    ED (erectile dysfunction)    Essential hypertension 10/16/2015   Fatty liver    First degree AV block    EKG 12/03/16   GERD (gastroesophageal reflux disease)    diet controlled, no meds currently, history of   H/O hiatal hernia    History of elevated PSA    Nocturia more than twice per night 12/21/2013   OA (osteoarthritis)    Obesity hypoventilation syndrome (HCC) 04/07/2014   Obesity, morbid (HCC) 12/21/2013   OSA on CPAP 04/07/2014   Pure hypercholesterolemia 04/18/2021   RBBB    previous EKG   Sleep apnea    uses CPAP nightly   Snoring 12/21/2013   Spondylosis 2014   lumbar back, noted on MR    Ulcer    stomach  ulcer "as teenager"   Past Surgical History:  Procedure Laterality Date   BACK SURGERY     "done approx. 24 years ago"   CARDIOVASCULAR STRESS TEST     "done approx 10 years ago, Dr. Timothy Lasso"   COLONOSCOPY  2006   hx of "with removal of polyps"/[facility]   COLONOSCOPY WITH PROPOFOL N/A 09/06/2015   Procedure: COLONOSCOPY WITH PROPOFOL;  Surgeon: Meryl Dare, MD;  Location: [facility];  Service: Endoscopy;  Laterality: N/A;   lipoma removed     from leg   LUMBAR LAMINECTOMY/DECOMPRESSION MICRODISCECTOMY  06/19/2012   Procedure: LUMBAR LAMINECTOMY/DECOMPRESSION MICRODISCECTOMY 1 LEVEL;  Surgeon: Venita Lick, MD;  Location: [facility];  Service: Orthopedics;  Laterality: Left;  L2-3 LEFT MICRODISCECTOMY   thigh surgery     fatty deposit   TONSILLECTOMY     TOTAL HIP ARTHROPLASTY Right 06/05/2017   Procedure: RIGHT TOTAL HIP ARTHROPLASTY ANTERIOR APPROACH;  Surgeon: Ollen Gross, MD;  Location: [facility];  Service: Orthopedics;  Laterality: Right;  Dr. requested 120 minutes   WISDOM TOOTH EXTRACTION     x 1 tooth   Patient Active Problem List   Diagnosis  Date Noted   Thoracic aortic aneurysm without rupture (HCC) 01/14/2023   Chronic diastolic heart failure (HCC) 04/23/2022   Ascending aortic aneurysm (HCC) 04/23/2022   Pure hypercholesterolemia 04/18/2021   Non-insulin dependent type 2 diabetes mellitus (HCC) 07/22/2018   RBBB (right bundle branch block) 10/10/2017   History of revision of total replacement of right hip joint 06/18/2017   OA (osteoarthritis) of hip 06/05/2017   Essential hypertension 10/16/2015   Hx of adenomatous colonic polyps    Benign neoplasm of transverse colon    Obesity hypoventilation syndrome (HCC) 04/07/2014   OSA on CPAP 04/07/2014   Obesity, morbid (HCC) 12/21/2013   Nocturia more than twice per night 12/21/2013   Intrinsic asthma 12/21/2013   Lumbar disc herniation with radiculopathy 06/13/2012    ONSET DATE: 06/03/2023 (referral date)    REFERRING DIAG: R26.9 (ICD-10-CM) - Gait disturbance  THERAPY DIAG:  Abnormality of gait and mobility  Unsteadiness on feet  Other low back pain  Dizziness and giddiness  Chronic pain of both knees  Repeated falls  Rationale for Evaluation and Treatment: Rehabilitation  SUBJECTIVE:                                                                                                                                                                                             SUBJECTIVE STATEMENT: Patient goes by "Tommy [name]."   Patient reports that he has gotten an oxygen sensor at home and has noticed a few times where it drops to 88% but improves if he stops and breaths. Patient reports that he feels like his exercises are helping. He has also been getting up every 30-60 minutes to walk start. Denies falls and near falls. Also got a new pair of sketchers that seem to be helping.    Pt accompanied by: self - drives self  PERTINENT HISTORY: suspected polyneuropathy, chronic diastolic heart failure, DMII, vertigo. Aortic aneurysm being monitored and stable at this time  PAIN:  Are you having pain?  None currently - reports pain intermittently in bilateral knees and some tightness   PRECAUTIONS: Fall  RED FLAGS: None   WEIGHT BEARING RESTRICTIONS: No  FALLS: Has patient fallen in last 6 months? Yes. Number of falls 2 falls - one in yard putting up inflatables on uneven ground, requires assistance to get up if doesn't have walking stick or tree to get up  LIVING ENVIRONMENT: Lives with: lives with their spouse Lives in: House/apartment Stairs: No  Has following equipment at home: Dan Humphreys - 2 wheeled, shower chair, Grab bars, and see above  PLOF: Independent other than putting on socks - retired Runner, broadcasting/film/video and  school administrator   PATIENT GOALS: "I would like to get back a little bit, stop my degeneration, get active enough to lose my last 30 lbs to get my knees replaced."    OBJECTIVE:  Note: Objective measures were completed at Evaluation unless otherwise noted.  DIAGNOSTIC FINDINGS:  01/14/2023 CT Aortic Aneurysm:   IMPRESSION: Grossly stable 4.9 cm Ascending thoracic aortic aneurysm. Recommend semi-annual imaging followup by CTA or MRA and referral to cardiothoracic surgery if not already obtained. This recommendation follows 2010 ACCF/AHA/AATS/ACR/ASA/SCA/SCAI/SIR/STS/SVM Guidelines for the Diagnosis and Management of Patients With Thoracic Aortic Disease. Circulation. 2010; 121: Z308-M578. Aortic aneurysm NOS (ICD10-I71.9).   Stable 1.6 cm left adrenal nodule is noted. Attention to this on follow-up imaging is recommended.  COGNITION: Overall cognitive status: Within functional limits for tasks assessed                                                                                                                      TREATMENT:    Vitals:   06/18/23 0814  BP: (!) 148/81  Pulse: 74  SpO2: 94%  Seated on LUE at rest, SPO2 during session 93-96%  TherAct: Discussed SPO2 levels and safe, patient is monitoring SPO2 at home; educated that if levels are dropping at home needs to speak with medial team about next steps; patient verbalizes understanding  Goal Assessment:   : 438 feet without AD and SPO2 holding at 93% throughout    [facility] PT Assessment - 06/18/23 0001       Functional Gait  Assessment   Gait assessed  Yes    Gait Level Surface Walks 20 ft in less than 7 sec but greater than 5.5 sec, uses assistive device, slower speed, mild gait deviations, or deviates 6-10 in outside of the 12 in walkway width.   6.69 seconds   Change in Gait Speed Able to smoothly change walking speed without loss of balance or gait deviation. Deviate no more than 6 in outside of the 12 in walkway width.    Gait with Horizontal Head Turns Performs head turns smoothly with slight change in gait velocity (eg, minor disruption to smooth gait path), deviates  6-10 in outside 12 in walkway width, or uses an assistive device.   minor deviation   Gait with Vertical Head Turns Performs task with slight change in gait velocity (eg, minor disruption to smooth gait path), deviates 6 - 10 in outside 12 in walkway width or uses assistive device   minor deviations   Gait and Pivot Turn Pivot turns safely in greater than 3 sec and stops with no loss of balance, or pivot turns safely within 3 sec and stops with mild imbalance, requires small steps to catch balance.   minor wobble   Step Over Obstacle Is able to step over one shoe box (4.5 in total height) without changing gait speed. No evidence of imbalance.   minor slow in pace   Gait with Narrow Base of Support Ambulates less than 4  steps heel to toe or cannot perform without assistance.   2-3 steps pending on trial   Gait with Eyes Closed Cannot walk 20 ft without assistance, severe gait deviations or imbalance, deviates greater than 15 in outside 12 in walkway width or will not attempt task.   large deviation   Ambulating Backwards Walks 20 ft, uses assistive device, slower speed, mild gait deviations, deviates 6-10 in outside 12 in walkway width.   mild deviation but corrects with visual cues   Steps Two feet to a stair, must use rail.   prefers to do one step at at time due to knees (does with alt but one minor buckle of L knee at end)   Total Score 16    FGA comment: 16/30 = Falls risk   SPO2 (93-96%)            NMR Discussed balance reactions and stepping strategy, including POC and focus moving forward Big lateral step return for stepping reaction standing at counter without UE support 1 x 10 reps Big backward step return for stepping reaction standing at counter without UE support 1 x 10 reps  PATIENT EDUCATION: Education details: Progression to initial HEP, see above, SPO2 safety and need to modify POC if oxygen level not staying above 90% with activity  Person educated: Patient Education method:  Explanation Education comprehension: verbalized understanding  HOME EXERCISE PROGRAM: Recommend increasing short distance walking 1 lap around home every hour to 30 minutes and 4 outdoor walks this week   Big lateral step return for stepping reaction standing at counter without UE support 1 x 10 reps 1x a day 3 x 10 reps Big backward step return for stepping reaction standing at counter without UE support 1 x 10 reps 1x a day 3 x 10 reps  GOALS: Goals reviewed with patient? Yes  SHORT TERM GOALS: Target date: 07/01/2023  Patient will demonstrate independence with initial HEP to continue to progress between physical therapy sessions.   Baseline: To be provided  Goal status: INITIAL  2. to be assessed and LTG written Baseline: Assessed on 1/28 Goal status: MET  3.  FGA to be assessed and LTG written Baseline: Assessed on 1/28 Goal status: MET   LONG TERM GOALS: Target date: 08/05/2023  Patient will report demonstrate independence with final HEP in order to maintain current gains and continue to progress after physical therapy discharge.   Baseline: To be provided  Goal status: INITIAL  2.  Patient will improve gait speed to 0.88 m/s with LRAD to indicate improvement to the level of community ambulator in order to participate more easily in activities outside of the home.   Baseline: 0.78 m/s without AD Goal status: INITIAL  3.  Patient will improve their 5x Sit to Stand score to less than 16 seconds with SPO2 90% or above to demonstrate a decreased risk for falls and improved LE strength.   Baseline: 19.66 seconds without UE and SPO2 at 90%  Goal status: INITIAL  4.  Patient will improve by 30 feet with SPO2 > 90% to demonstrate progress towards age reported norms for aerobic capacity and endurance in order to participate more easily in daily walking tasks.    Baseline: 438 feet without AD SPO2 93% Goal status: INITIAL  5.  Patient will improve FGA to greater than  20/30 to indicate a decreased risk of falls and improved dynamic stability.   Baseline: 16/30 Goal status: INITIAL  ASSESSMENT:  CLINICAL IMPRESSION: Emphasis  on skilled PT session working on reviewing SPO2 safety, adding to HEP, and assessing and FGA. Patient demonstrates minor reductions in cardiorespiratory endurance as indicated by and is at an increased risk for falls on FGA. Sensation and history of vestibular deficits likely contribute to score on FGA. SPO2 improved from prior session with range in readings from 93-96% during session as patient more aware of pacing and breathing. Continue POC as able.   OBJECTIVE IMPAIRMENTS: Abnormal gait, decreased balance, decreased endurance, difficulty walking, decreased ROM, decreased strength, dizziness, obesity, and pain.   ACTIVITY LIMITATIONS: carrying, bending, and dressing  PARTICIPATION LIMITATIONS: community activity and yard work  PERSONAL FACTORS: Age, Past/current experiences, Time since onset of injury/illness/exacerbation, and 3+ comorbidities: see above  are also affecting patient's functional outcome.   REHAB POTENTIAL: Fair limited by multiple commodities and SPO2 fluctuations  CLINICAL DECISION MAKING: Evolving/moderate complexity  EVALUATION COMPLEXITY: Moderate  PLAN:  PT FREQUENCY: 1x/week  PT DURATION: 6 weeks  PLANNED INTERVENTIONS: 97164- PT Re-evaluation, 97110-Therapeutic exercises, 97530- Therapeutic activity, 97112- Neuromuscular re-education, 97535- Self Care, 24401- Manual therapy, [phone]- Gait training, [phone]- Canalith repositioning, and Dry Needling  PLAN FOR NEXT SESSION: work on stepping reaction and adding band pull aparts, balance on compliant surface as tolerated, basic knee exercises as indicated for pain management, trial of trekking poles for outdoor walking, med ball slams as back allows and hip hinging    Carmelia Bake, PT, DPT 06/18/2023, 8:58 AM

## 2023-06-24 ENCOUNTER — Ambulatory Visit: Payer: Medicare PPO | Attending: Podiatry | Admitting: Physical Therapy

## 2023-06-24 ENCOUNTER — Encounter: Payer: Self-pay | Admitting: Physical Therapy

## 2023-06-24 VITALS — BP 126/72 | HR 72

## 2023-06-24 DIAGNOSIS — M25561 Pain in right knee: Secondary | ICD-10-CM | POA: Insufficient documentation

## 2023-06-24 DIAGNOSIS — R2681 Unsteadiness on feet: Secondary | ICD-10-CM | POA: Insufficient documentation

## 2023-06-24 DIAGNOSIS — G8929 Other chronic pain: Secondary | ICD-10-CM | POA: Diagnosis not present

## 2023-06-24 DIAGNOSIS — R42 Dizziness and giddiness: Secondary | ICD-10-CM | POA: Insufficient documentation

## 2023-06-24 DIAGNOSIS — R296 Repeated falls: Secondary | ICD-10-CM | POA: Diagnosis not present

## 2023-06-24 DIAGNOSIS — R269 Unspecified abnormalities of gait and mobility: Secondary | ICD-10-CM | POA: Insufficient documentation

## 2023-06-24 DIAGNOSIS — H8111 Benign paroxysmal vertigo, right ear: Secondary | ICD-10-CM | POA: Diagnosis not present

## 2023-06-24 DIAGNOSIS — M5459 Other low back pain: Secondary | ICD-10-CM | POA: Insufficient documentation

## 2023-06-24 DIAGNOSIS — M25562 Pain in left knee: Secondary | ICD-10-CM | POA: Diagnosis not present

## 2023-06-24 NOTE — Therapy (Signed)
OUTPATIENT PHYSICAL THERAPY NEURO TREATMENT   Patient Name: Tommy [name] MRN: [id number] DOB:20-Feb-1946, 78 y.o., male Today's Date: 06/24/2023   PCP: Tommy Corn, MD REFERRING PROVIDER: Edwin [name], DPM  END OF SESSION:  PT End of Session - 06/24/23 0847     Visit Number 3    Number of Visits 7    Date for PT Re-Evaluation 08/05/23    Authorization Type [employer]    PT Start Time [phone]    PT Stop Time 0926    PT Time Calculation (min) 40 min    Equipment Utilized During Treatment Gait belt    Activity Tolerance Patient tolerated treatment well;Treatment limited secondary to medical complications (Comment);Patient limited by pain   mild limitations due to pain and SPO2 readings, otherwise tolerated well   Behavior During Therapy [facility] for tasks assessed/performed              Past Medical History:  Diagnosis Date   Allergy    Anemia    "as teenager"   Arthritis    hands, hip   Ascending aortic aneurysm (HCC) 04/23/2022   Back pain    BPPV (benign paroxysmal positional vertigo)    History of   Cancer (HCC)    "melanoma removed from leg years ago", malignant mole   Cataract    Chronic back pain    Chronic diastolic heart failure (HCC) 04/23/2022   Complication of anesthesia    woke up and was confused and combative after back surgery   Constipation    DDD (degenerative disc disease), lumbar    Diabetes mellitus without complication (HCC)    Diabetic neuropathy (HCC)    ED (erectile dysfunction)    Essential hypertension 10/16/2015   Fatty liver    First degree AV block    EKG 12/03/16   GERD (gastroesophageal reflux disease)    diet controlled, no meds currently, history of   H/O hiatal hernia    History of elevated PSA    Nocturia more than twice per night 12/21/2013   OA (osteoarthritis)    Obesity hypoventilation syndrome (HCC) 04/07/2014   Obesity, morbid (HCC) 12/21/2013   OSA on CPAP 04/07/2014   Pure hypercholesterolemia  04/18/2021   RBBB    previous EKG   Sleep apnea    uses CPAP nightly   Snoring 12/21/2013   Spondylosis 2014   lumbar back, noted on MR    Ulcer    stomach ulcer "as teenager"   Past Surgical History:  Procedure Laterality Date   BACK SURGERY     "done approx. 24 years ago"   CARDIOVASCULAR STRESS TEST     "done approx 10 years ago, Dr. Timothy [name]"   COLONOSCOPY  2006   hx of "with removal of polyps"/[facility]   COLONOSCOPY WITH PROPOFOL N/A 09/06/2015   Procedure: COLONOSCOPY WITH PROPOFOL;  Surgeon: Tommy Dare, MD;  Location: [facility];  Service: Endoscopy;  Laterality: N/A;   lipoma removed     from leg   LUMBAR LAMINECTOMY/DECOMPRESSION MICRODISCECTOMY  06/19/2012   Procedure: LUMBAR LAMINECTOMY/DECOMPRESSION MICRODISCECTOMY 1 LEVEL;  Surgeon: Tommy Lick, MD;  Location: [facility];  Service: Orthopedics;  Laterality: Left;  L2-3 LEFT MICRODISCECTOMY   thigh surgery     fatty deposit   TONSILLECTOMY     TOTAL HIP ARTHROPLASTY Right 06/05/2017   Procedure: RIGHT TOTAL HIP ARTHROPLASTY ANTERIOR APPROACH;  Surgeon: Tommy Gross, MD;  Location: [facility];  Service: Orthopedics;  Laterality: Right;  Dr. requested 120 minutes  WISDOM TOOTH EXTRACTION     x 1 tooth   Patient Active Problem List   Diagnosis Date Noted   Thoracic aortic aneurysm without rupture (HCC) 01/14/2023   Chronic diastolic heart failure (HCC) 04/23/2022   Ascending aortic aneurysm (HCC) 04/23/2022   Pure hypercholesterolemia 04/18/2021   Non-insulin dependent type 2 diabetes mellitus (HCC) 07/22/2018   RBBB (right bundle branch block) 10/10/2017   History of revision of total replacement of right hip joint 06/18/2017   OA (osteoarthritis) of hip 06/05/2017   Essential hypertension 10/16/2015   Hx of adenomatous colonic polyps    Benign neoplasm of transverse colon    Obesity hypoventilation syndrome (HCC) 04/07/2014   OSA on CPAP 04/07/2014   Obesity, morbid (HCC) 12/21/2013   Nocturia more than twice  per night 12/21/2013   Intrinsic asthma 12/21/2013   Lumbar disc herniation with radiculopathy 06/13/2012    ONSET DATE: 06/03/2023 (referral date)   REFERRING DIAG: R26.9 (ICD-10-CM) - Gait disturbance  THERAPY DIAG:  Abnormality of gait and mobility  Unsteadiness on feet  Other low back pain  Chronic pain of both knees  Repeated falls  Dizziness and giddiness  Rationale for Evaluation and Treatment: Rehabilitation  SUBJECTIVE:                                                                                                                                                                                             SUBJECTIVE STATEMENT: Patient goes by "Tommy [name]."   Patient reports that he is continuing to work on his exercises at home. He feels like sometimes his knee and hips limit his progress. Patient denies falls and near falls. Patient reports that his oxygen continues to be 92-94% at home and improves with breathing and reports that he has still seen a few 88%.   Pt accompanied by: self - drives self  PERTINENT HISTORY: suspected polyneuropathy, chronic diastolic heart failure, DMII, vertigo. Aortic aneurysm being monitored and stable at this time  PAIN:  Are you having pain?  None currently - reports pain intermittently in bilateral knees and some tightness   PRECAUTIONS: Fall  RED FLAGS: None   WEIGHT BEARING RESTRICTIONS: No  FALLS: Has patient fallen in last 6 months? Yes. Number of falls 2 falls - one in yard putting up inflatables on uneven ground, requires assistance to get up if doesn't have walking stick or tree to get up  LIVING ENVIRONMENT: Lives with: lives with their spouse Lives in: House/apartment Stairs: No  Has following equipment at home: Dan Humphreys - 2 wheeled, shower chair, Grab bars, and see above  PLOF: Independent other than putting on  socks - retired Teaching laboratory technician   PATIENT GOALS: "I would like to get back a little bit,  stop my degeneration, get active enough to lose my last 30 lbs to get my knees replaced."   OBJECTIVE:  Note: Objective measures were completed at Evaluation unless otherwise noted.  DIAGNOSTIC FINDINGS:  01/14/2023 CT Aortic Aneurysm:   IMPRESSION: Grossly stable 4.9 cm Ascending thoracic aortic aneurysm. Recommend semi-annual imaging followup by CTA or MRA and referral to cardiothoracic surgery if not already obtained. This recommendation follows 2010 ACCF/AHA/AATS/ACR/ASA/SCA/SCAI/SIR/STS/SVM Guidelines for the Diagnosis and Management of Patients With Thoracic Aortic Disease. Circulation. 2010; 121: Y865-H846. Aortic aneurysm NOS (ICD10-I71.9).   Stable 1.6 cm left adrenal nodule is noted. Attention to this on follow-up imaging is recommended.  COGNITION: Overall cognitive status: Within functional limits for tasks assessed                                                                                                                     TREATMENT:    Vitals:   06/24/23 0851  BP: 126/72  Pulse: 72  Seated on LUE at rest, SPO2 during session 94-96% for majority of session, 1x dropped to 89% for <1 second when patient holding breath and then raised to 90% with paced breathing   NMR: 2 Rounds 6 Blaze pods on random setting for improved single leg stance stability, stepping reaction, hip and knee clearance.  Performed on 1 minute intervals with 30 rest periods.  Pt requires CGA guarding. Round 1 and 4:  3 pods on first step, 3 pods on lower step, no UE use, tapping without coming back to center tapping with LLE stance on RLE setup.  Round 2 and 5:  3 pods on first step, 3 pods on lower step, no UE use, tapping without coming back to center tapping with RLE stance on LLE setup.   Round 3 and 6:  3 pods on first step, 3 pods on lower step, no UE use, alt tapping setup.   Notable errors/deficits:  foot down intermittently on first and second set ~4 times each round, improved on  second set through each without major LOB Tandem balance beam work near Transport planner with CGA 2 laps down and back hovering hands over ballet bar lateral stepping 2 laps down and back holding 6lb med ball with lateral stepping 10x med ball slams standing still on foam balance beam 2 laps down and back with 6lb med ball slams with lateral stepping   Self Care:  Reviewed safe SPO2 readings; PT explained that she would follow up with cardiology as patient SPO2 readings have dropped on 2 occasions in PT to lower readings, recommended continued paced breathing as this keeps patient SPO2 readings in safe range, patient verbalized understanding and will continue to monitor at home   PATIENT EDUCATION: Education details: Continue HEP + safe SPO2  Person educated: Patient Education method: Explanation Education comprehension: verbalized understanding  HOME EXERCISE PROGRAM: Recommend increasing short distance walking 1  lap around home every hour to 30 minutes and 4 outdoor walks this week   Big lateral step return for stepping reaction standing at counter without UE support 1 x 10 reps 1x a day 3 x 10 reps Big backward step return for stepping reaction standing at counter without UE support 1 x 10 reps 1x a day 3 x 10 reps  GOALS: Goals reviewed with patient? Yes  SHORT TERM GOALS: Target date: 07/01/2023  Patient will demonstrate independence with initial HEP to continue to progress between physical therapy sessions.   Baseline: To be provided  Goal status: INITIAL  2. to be assessed and LTG written Baseline: Assessed on 1/28 Goal status: MET  3.  FGA to be assessed and LTG written Baseline: Assessed on 1/28 Goal status: MET   LONG TERM GOALS: Target date: 08/05/2023  Patient will report demonstrate independence with final HEP in order to maintain current gains and continue to progress after physical therapy discharge.   Baseline: To be provided  Goal status: INITIAL  2.   Patient will improve gait speed to 0.88 m/s with LRAD to indicate improvement to the level of community ambulator in order to participate more easily in activities outside of the home.   Baseline: 0.78 m/s without AD Goal status: INITIAL  3.  Patient will improve their 5x Sit to Stand score to less than 16 seconds with SPO2 90% or above to demonstrate a decreased risk for falls and improved LE strength.   Baseline: 19.66 seconds without UE and SPO2 at 90%  Goal status: INITIAL  4.  Patient will improve by 30 feet with SPO2 > 90% to demonstrate progress towards age reported norms for aerobic capacity and endurance in order to participate more easily in daily walking tasks.    Baseline: 438 feet without AD SPO2 93% Goal status: INITIAL  5.  Patient will improve FGA to greater than 20/30 to indicate a decreased risk of falls and improved dynamic stability.   Baseline: 16/30 Goal status: INITIAL  ASSESSMENT:  CLINICAL IMPRESSION: Emphasis on skilled PT session working on stepping reaction and single leg stance. Patient mildly limited by hip and knee pain requiring a few breaks during session. PT will also send message to cardiology as patient's SPO2 briefly dropped to 89% when holding breath during exercise. Improves with paced and conscious breathing and in safe range for rest of session but want to make medical team aware.Continue POC as able.   OBJECTIVE IMPAIRMENTS: Abnormal gait, decreased balance, decreased endurance, difficulty walking, decreased ROM, decreased strength, dizziness, obesity, and pain.   ACTIVITY LIMITATIONS: carrying, bending, and dressing  PARTICIPATION LIMITATIONS: community activity and yard work  PERSONAL FACTORS: Age, Past/current experiences, Time since onset of injury/illness/exacerbation, and 3+ comorbidities: see above  are also affecting patient's functional outcome.   REHAB POTENTIAL: Fair limited by multiple commodities and SPO2  fluctuations  CLINICAL DECISION MAKING: Evolving/moderate complexity  EVALUATION COMPLEXITY: Moderate  PLAN:  PT FREQUENCY: 1x/week  PT DURATION: 6 weeks  PLANNED INTERVENTIONS: 97164- PT Re-evaluation, 97110-Therapeutic exercises, 97530- Therapeutic activity, [id number]- Neuromuscular re-education, 97535- Self Care, [id number]- Manual therapy, 97116- Gait training, [phone]- Canalith repositioning, and Dry Needling  PLAN FOR NEXT SESSION: work on stepping reaction and adding band pull aparts, balance on compliant surface as tolerated, basic knee exercises as indicated for pain management, trial of trekking poles for outdoor walking, med ball slams as back allows and hip hinging  Backwards stepping, add to HEP with modified SLS stepping  work, updates on follow up with cardiology    Carmelia Bake, PT, DPT 06/24/2023, 10:08 AM

## 2023-06-27 DIAGNOSIS — E1140 Type 2 diabetes mellitus with diabetic neuropathy, unspecified: Secondary | ICD-10-CM | POA: Diagnosis not present

## 2023-06-27 DIAGNOSIS — G629 Polyneuropathy, unspecified: Secondary | ICD-10-CM | POA: Diagnosis not present

## 2023-06-27 DIAGNOSIS — K760 Fatty (change of) liver, not elsewhere classified: Secondary | ICD-10-CM | POA: Diagnosis not present

## 2023-06-27 DIAGNOSIS — I10 Essential (primary) hypertension: Secondary | ICD-10-CM | POA: Diagnosis not present

## 2023-06-27 DIAGNOSIS — I872 Venous insufficiency (chronic) (peripheral): Secondary | ICD-10-CM | POA: Diagnosis not present

## 2023-06-27 DIAGNOSIS — I5189 Other ill-defined heart diseases: Secondary | ICD-10-CM | POA: Diagnosis not present

## 2023-06-27 DIAGNOSIS — I712 Thoracic aortic aneurysm, without rupture, unspecified: Secondary | ICD-10-CM | POA: Diagnosis not present

## 2023-07-01 ENCOUNTER — Encounter: Payer: Self-pay | Admitting: Physical Therapy

## 2023-07-01 ENCOUNTER — Ambulatory Visit: Payer: Medicare PPO | Admitting: Physical Therapy

## 2023-07-01 VITALS — BP 139/77 | HR 59

## 2023-07-01 DIAGNOSIS — M5459 Other low back pain: Secondary | ICD-10-CM | POA: Diagnosis not present

## 2023-07-01 DIAGNOSIS — R296 Repeated falls: Secondary | ICD-10-CM | POA: Diagnosis not present

## 2023-07-01 DIAGNOSIS — H8111 Benign paroxysmal vertigo, right ear: Secondary | ICD-10-CM | POA: Diagnosis not present

## 2023-07-01 DIAGNOSIS — R42 Dizziness and giddiness: Secondary | ICD-10-CM | POA: Diagnosis not present

## 2023-07-01 DIAGNOSIS — R2681 Unsteadiness on feet: Secondary | ICD-10-CM | POA: Diagnosis not present

## 2023-07-01 DIAGNOSIS — M25561 Pain in right knee: Secondary | ICD-10-CM | POA: Diagnosis not present

## 2023-07-01 DIAGNOSIS — R269 Unspecified abnormalities of gait and mobility: Secondary | ICD-10-CM | POA: Diagnosis not present

## 2023-07-01 DIAGNOSIS — G8929 Other chronic pain: Secondary | ICD-10-CM

## 2023-07-01 DIAGNOSIS — M25562 Pain in left knee: Secondary | ICD-10-CM | POA: Diagnosis not present

## 2023-07-01 NOTE — Therapy (Signed)
 OUTPATIENT PHYSICAL THERAPY NEURO TREATMENT   Patient Name: Tommy [name] MRN: [id number] DOB:03-23-1946, 78 y.o., male Today's Date: 07/01/2023   PCP: Margarete Sharps, MD REFERRING PROVIDER: Floyce Hutching, DPM  END OF SESSION:  PT End of Session - 07/01/23 0804     Visit Number 4    Number of Visits 7    Date for PT Re-Evaluation 08/05/23    Authorization Type [employer]    PT Start Time 0802    PT Stop Time 0845    PT Time Calculation (min) 43 min    Equipment Utilized During Treatment Gait belt    Activity Tolerance Patient tolerated treatment well;Treatment limited secondary to medical complications (Comment);Patient limited by pain    Behavior During Therapy [facility] for tasks assessed/performed              Past Medical History:  Diagnosis Date   Allergy    Anemia    "as teenager"   Arthritis    hands, hip   Ascending aortic aneurysm (HCC) 04/23/2022   Back pain    BPPV (benign paroxysmal positional vertigo)    History of   Cancer (HCC)    "melanoma removed from leg years ago", malignant mole   Cataract    Chronic back pain    Chronic diastolic heart failure (HCC) 04/23/2022   Complication of anesthesia    woke up and was confused and combative after back surgery   Constipation    DDD (degenerative disc disease), lumbar    Diabetes mellitus without complication (HCC)    Diabetic neuropathy (HCC)    ED (erectile dysfunction)    Essential hypertension 10/16/2015   Fatty liver    First degree AV block    EKG 12/03/16   GERD (gastroesophageal reflux disease)    diet controlled, no meds currently, history of   H/O hiatal hernia    History of elevated PSA    Nocturia more than twice per night 12/21/2013   OA (osteoarthritis)    Obesity hypoventilation syndrome (HCC) 04/07/2014   Obesity, morbid (HCC) 12/21/2013   OSA on CPAP 04/07/2014   Pure hypercholesterolemia 04/18/2021   RBBB    previous EKG   Sleep apnea    uses CPAP nightly    Snoring 12/21/2013   Spondylosis 2014   lumbar back, noted on MR    Ulcer    stomach ulcer "as teenager"   Past Surgical History:  Procedure Laterality Date   BACK SURGERY     "done approx. 24 years ago"   CARDIOVASCULAR STRESS TEST     "done approx 10 years ago, Dr. Mamie Searles"   COLONOSCOPY  2006   hx of "with removal of polyps"/[facility]   COLONOSCOPY WITH PROPOFOL  N/A 09/06/2015   Procedure: COLONOSCOPY WITH PROPOFOL ;  Surgeon: Asencion Blacksmith, MD;  Location: [facility];  Service: Endoscopy;  Laterality: N/A;   lipoma removed     from leg   LUMBAR LAMINECTOMY/DECOMPRESSION MICRODISCECTOMY  06/19/2012   Procedure: LUMBAR LAMINECTOMY/DECOMPRESSION MICRODISCECTOMY 1 LEVEL;  Surgeon: Mort Ards, MD;  Location: [facility] OR;  Service: Orthopedics;  Laterality: Left;  L2-3 LEFT MICRODISCECTOMY   thigh surgery     fatty deposit   TONSILLECTOMY     TOTAL HIP ARTHROPLASTY Right 06/05/2017   Procedure: RIGHT TOTAL HIP ARTHROPLASTY ANTERIOR APPROACH;  Surgeon: Liliane Rei, MD;  Location: [facility];  Service: Orthopedics;  Laterality: Right;  Dr. requested 120 minutes   WISDOM TOOTH EXTRACTION     x 1 tooth  Patient Active Problem List   Diagnosis Date Noted   Thoracic aortic aneurysm without rupture (HCC) 01/14/2023   Chronic diastolic heart failure (HCC) 04/23/2022   Ascending aortic aneurysm (HCC) 04/23/2022   Pure hypercholesterolemia 04/18/2021   Non-insulin  dependent type 2 diabetes mellitus (HCC) 07/22/2018   RBBB (right bundle branch block) 10/10/2017   History of revision of total replacement of right hip joint 06/18/2017   OA (osteoarthritis) of hip 06/05/2017   Essential hypertension 10/16/2015   Hx of adenomatous colonic polyps    Benign neoplasm of transverse colon    Obesity hypoventilation syndrome (HCC) 04/07/2014   OSA on CPAP 04/07/2014   Obesity, morbid (HCC) 12/21/2013   Nocturia more than twice per night 12/21/2013   Intrinsic asthma 12/21/2013   Lumbar disc  herniation with radiculopathy 06/13/2012    ONSET DATE: 06/03/2023 (referral date)   REFERRING DIAG: R26.9 (ICD-10-CM) - Gait disturbance  THERAPY DIAG:  Abnormality of gait and mobility  Unsteadiness on feet  Other low back pain  Chronic pain of both knees  Repeated falls  Dizziness and giddiness  Rationale for Evaluation and Treatment: Rehabilitation  SUBJECTIVE:                                                                                                                                                                                             SUBJECTIVE STATEMENT: Patient goes by "Tommy [name]."   Patient reports that he is doing good overall. He reports soreness is greater today, unrelated to PT. He reports that is otherwise doing well. Wasn't able to do as much this week for homework as a result. Patient reports that he talked with his PCP about patient's oxygen  level, and they are not major concerned at this time.  Pt accompanied by: self - drives self  PERTINENT HISTORY: suspected polyneuropathy, chronic diastolic heart failure, DMII, vertigo. Aortic aneurysm being monitored and stable at this time  PAIN:  Are you having pain?  None currently - reports pain intermittently in bilateral knees and some tightness   PRECAUTIONS: Fall  RED FLAGS: None   WEIGHT BEARING RESTRICTIONS: No  FALLS: Has patient fallen in last 6 months? Yes. Number of falls 2 falls - one in yard putting up inflatables on uneven ground, requires assistance to get up if doesn't have walking stick or tree to get up  LIVING ENVIRONMENT: Lives with: lives with their spouse Lives in: House/apartment Stairs: No  Has following equipment at home: Otho Blitz - 2 wheeled, shower chair, Grab bars, and see above  PLOF: Independent other than putting on socks - retired Teaching laboratory technician   PATIENT  GOALS: "I would like to get back a little bit, stop my degeneration, get active enough to lose my  last 30 lbs to get my knees replaced."   OBJECTIVE:  Note: Objective measures were completed at Evaluation unless otherwise noted.  DIAGNOSTIC FINDINGS:  01/14/2023 CT Aortic Aneurysm:   IMPRESSION: Grossly stable 4.9 cm Ascending thoracic aortic aneurysm. Recommend semi-annual imaging followup by CTA or MRA and referral to cardiothoracic surgery if not already obtained. This recommendation follows 2010 ACCF/AHA/AATS/ACR/ASA/SCA/SCAI/SIR/STS/SVM Guidelines for the Diagnosis and Management of Patients With Thoracic Aortic Disease. Circulation. 2010; 121: Z610-R604. Aortic aneurysm NOS (ICD10-I71.9).   Stable 1.6 cm left adrenal nodule is noted. Attention to this on follow-up imaging is recommended.  COGNITION: Overall cognitive status: Within functional limits for tasks assessed                                                                                                                     TREATMENT:    Vitals:   07/01/23 0809  BP: 139/77  Pulse: (!) 59  SpO2: 94%   Seated on RUE at rest, SPO2 during session 94-96% for majority of session  TherAct: Reactive stepping backwards on tandem foam balance beam Circuit 1: without theraband, Circuit 2: adding green TA shoulder extension pulls Step backs on L leg 2 x 10 reps Step back on R leg 2 x 10 reps Step back alternating 2 x 10 reps bil Rocker board for anterior posterior weight shift training EO NBOS 3 x 30 seconds  EO NBOS with vertical head nods 3 x 5 rounds  One LOB requiring step back on first round, improved on second 2  NMR: Updated HEP for static balance work - Leisure centre manager: Eyes Open With Head Turns  - 2 x 10 reps - Romberg Stance Eyes Closed on Foam Pad  1 x 30 seconds  - Horizontal head turns EC 1 x 10 (increased instability)  - Vertical head turns EC 1 x 10 reps  PATIENT EDUCATION: Education details: Continue HEP + updates HEP Person educated: Patient Education method:  Explanation Education comprehension: verbalized understanding  HOME EXERCISE PROGRAM: Recommend increasing short distance walking 1 lap around home every hour to 30 minutes and 4 outdoor walks this week   Big lateral step return for stepping reaction standing at counter without UE support 1 x 10 reps 1x a day 3 x 10 reps Big backward step return for stepping reaction standing at counter without UE support 1 x 10 reps 1x a day 3 x 10 reps  Access Code: [email or web address] URL: [email or web address] Date: 07/01/2023 Prepared by: Camella Cave  Exercises - Semi-Tandem Corner Balance: Eyes Open With Head Turns  - 1 x daily - 7 x weekly - 2 sets - 5-10 reps - Romberg Stance Eyes Closed on Foam Pad  - 1 x daily - 7 x weekly - 3 sets - 5-10 reps with head turns, 30 seconds with eyes closed only hold  GOALS: Goals reviewed with patient? Yes  SHORT  TERM GOALS: Target date: 07/01/2023  Patient will demonstrate independence with initial HEP to continue to progress between physical therapy sessions.   Baseline: To be provided, reports understanding of exercises thus far Goal status: MET  2. to be assessed and LTG written Baseline: Assessed on 1/28 Goal status: MET  3.  FGA to be assessed and LTG written Baseline: Assessed on 1/28 Goal status: MET   LONG TERM GOALS: Target date: 08/05/2023  Patient will report demonstrate independence with final HEP in order to maintain current gains and continue to progress after physical therapy discharge.   Baseline: To be provided  Goal status: INITIAL  2.  Patient will improve gait speed to 0.88 m/s with LRAD to indicate improvement to the level of community ambulator in order to participate more easily in activities outside of the home.   Baseline: 0.78 m/s without AD Goal status: INITIAL  3.  Patient will improve their 5x Sit to Stand score to less than 16 seconds with SPO2 90% or above to demonstrate a decreased risk for falls and  improved LE strength.   Baseline: 19.66 seconds without UE and SPO2 at 90%  Goal status: INITIAL  4.  Patient will improve by 30 feet with SPO2 > 90% to demonstrate progress towards age reported norms for aerobic capacity and endurance in order to participate more easily in daily walking tasks.    Baseline: 438 feet without AD SPO2 93% Goal status: INITIAL  5.  Patient will improve FGA to greater than 20/30 to indicate a decreased risk of falls and improved dynamic stability.   Baseline: 16/30 Goal status: INITIAL  ASSESSMENT:  CLINICAL IMPRESSION: Emphasis on skilled PT session on progressing posterior stepping reaciton and static balance response to promote available ankle ROM and compensatory hip strategy. Patinet overall tolerated session well with min restriction due to knees. Patient has achieved all STGs thus far and progressing well towards LTGs. Continue POC as able.   OBJECTIVE IMPAIRMENTS: Abnormal gait, decreased balance, decreased endurance, difficulty walking, decreased ROM, decreased strength, dizziness, obesity, and pain.   ACTIVITY LIMITATIONS: carrying, bending, and dressing  PARTICIPATION LIMITATIONS: community activity and yard work  PERSONAL FACTORS: Age, Past/current experiences, Time since onset of injury/illness/exacerbation, and 3+ comorbidities: see above  are also affecting patient's functional outcome.   REHAB POTENTIAL: Fair limited by multiple commodities and SPO2 fluctuations  CLINICAL DECISION MAKING: Evolving/moderate complexity  EVALUATION COMPLEXITY: Moderate  PLAN:  PT FREQUENCY: 1x/week  PT DURATION: 6 weeks  PLANNED INTERVENTIONS: 97164- PT Re-evaluation, 97110-Therapeutic exercises, 97530- Therapeutic activity, 97112- Neuromuscular re-education, 97535- Self Care, 91478- Manual therapy, [phone]- Gait training, [phone]- Canalith repositioning, and Dry Needling  PLAN FOR NEXT SESSION: work on stepping reaction and adding band pull aparts,  balance on compliant surface as tolerated, basic knee exercises as indicated for pain management, trial of trekking poles for outdoor walking, med ball slams as back allows and hip hinging, how is HEP going   Backwards stepping, add to HEP with modified SLS stepping work, updates on follow up with cardiology    Coreen Devoid, PT, DPT 07/01/2023, 9:47 AM

## 2023-07-03 ENCOUNTER — Other Ambulatory Visit: Payer: Self-pay | Admitting: Cardiovascular Disease

## 2023-07-04 DIAGNOSIS — G4733 Obstructive sleep apnea (adult) (pediatric): Secondary | ICD-10-CM | POA: Diagnosis not present

## 2023-07-08 ENCOUNTER — Ambulatory Visit: Payer: Medicare PPO | Admitting: Physical Therapy

## 2023-07-11 ENCOUNTER — Ambulatory Visit: Payer: Self-pay | Admitting: Physical Therapy

## 2023-07-15 DIAGNOSIS — M1712 Unilateral primary osteoarthritis, left knee: Secondary | ICD-10-CM | POA: Diagnosis not present

## 2023-07-16 ENCOUNTER — Encounter: Payer: Self-pay | Admitting: Physical Therapy

## 2023-07-16 ENCOUNTER — Ambulatory Visit: Payer: Medicare PPO | Admitting: Physical Therapy

## 2023-07-16 VITALS — BP 145/81 | HR 65

## 2023-07-16 DIAGNOSIS — M25561 Pain in right knee: Secondary | ICD-10-CM | POA: Diagnosis not present

## 2023-07-16 DIAGNOSIS — G8929 Other chronic pain: Secondary | ICD-10-CM | POA: Diagnosis not present

## 2023-07-16 DIAGNOSIS — H8111 Benign paroxysmal vertigo, right ear: Secondary | ICD-10-CM | POA: Diagnosis not present

## 2023-07-16 DIAGNOSIS — R296 Repeated falls: Secondary | ICD-10-CM

## 2023-07-16 DIAGNOSIS — R2681 Unsteadiness on feet: Secondary | ICD-10-CM

## 2023-07-16 DIAGNOSIS — R42 Dizziness and giddiness: Secondary | ICD-10-CM | POA: Diagnosis not present

## 2023-07-16 DIAGNOSIS — R269 Unspecified abnormalities of gait and mobility: Secondary | ICD-10-CM | POA: Diagnosis not present

## 2023-07-16 DIAGNOSIS — M5459 Other low back pain: Secondary | ICD-10-CM

## 2023-07-16 DIAGNOSIS — M25562 Pain in left knee: Secondary | ICD-10-CM | POA: Diagnosis not present

## 2023-07-16 NOTE — Therapy (Signed)
 OUTPATIENT PHYSICAL THERAPY NEURO TREATMENT   Patient Name: Tommy [name] MRN: [id number] DOB:12-29-1945, 78 y.o., male Today's Date: 07/16/2023   PCP: Creola Corn, MD REFERRING PROVIDER: Edwin Cap, DPM  END OF SESSION:  PT End of Session - 07/16/23 0805     Visit Number 5    Number of Visits 11    Date for PT Re-Evaluation 08/20/23    Authorization Type [employer]    Authorization - Visit Number 4    Authorization - Number of Visits 8    PT Start Time 0806    PT Stop Time 0846    PT Time Calculation (min) 40 min    Equipment Utilized During Treatment Other (comment)   mat level session; gait belt not indicated   Activity Tolerance Patient tolerated treatment well    Behavior During Therapy [facility] for tasks assessed/performed;Anxious              Past Medical History:  Diagnosis Date   Allergy    Anemia    "as teenager"   Arthritis    hands, hip   Ascending aortic aneurysm (HCC) 04/23/2022   Back pain    BPPV (benign paroxysmal positional vertigo)    History of   Cancer (HCC)    "melanoma removed from leg years ago", malignant mole   Cataract    Chronic back pain    Chronic diastolic heart failure (HCC) 04/23/2022   Complication of anesthesia    woke up and was confused and combative after back surgery   Constipation    DDD (degenerative disc disease), lumbar    Diabetes mellitus without complication (HCC)    Diabetic neuropathy (HCC)    ED (erectile dysfunction)    Essential hypertension 10/16/2015   Fatty liver    First degree AV block    EKG 12/03/16   GERD (gastroesophageal reflux disease)    diet controlled, no meds currently, history of   H/O hiatal hernia    History of elevated PSA    Nocturia more than twice per night 12/21/2013   OA (osteoarthritis)    Obesity hypoventilation syndrome (HCC) 04/07/2014   Obesity, morbid (HCC) 12/21/2013   OSA on CPAP 04/07/2014   Pure hypercholesterolemia 04/18/2021   RBBB    previous EKG    Sleep apnea    uses CPAP nightly   Snoring 12/21/2013   Spondylosis 2014   lumbar back, noted on MR    Ulcer    stomach ulcer "as teenager"   Past Surgical History:  Procedure Laterality Date   BACK SURGERY     "done approx. 24 years ago"   CARDIOVASCULAR STRESS TEST     "done approx 10 years ago, Dr. Timothy Lasso"   COLONOSCOPY  2006   hx of "with removal of polyps"/[facility]   COLONOSCOPY WITH PROPOFOL N/A 09/06/2015   Procedure: COLONOSCOPY WITH PROPOFOL;  Surgeon: Meryl Dare, MD;  Location: [facility];  Service: Endoscopy;  Laterality: N/A;   lipoma removed     from leg   LUMBAR LAMINECTOMY/DECOMPRESSION MICRODISCECTOMY  06/19/2012   Procedure: LUMBAR LAMINECTOMY/DECOMPRESSION MICRODISCECTOMY 1 LEVEL;  Surgeon: Venita Lick, MD;  Location: [facility] OR;  Service: Orthopedics;  Laterality: Left;  L2-3 LEFT MICRODISCECTOMY   thigh surgery     fatty deposit   TONSILLECTOMY     TOTAL HIP ARTHROPLASTY Right 06/05/2017   Procedure: RIGHT TOTAL HIP ARTHROPLASTY ANTERIOR APPROACH;  Surgeon: Ollen Gross, MD;  Location: [facility];  Service: Orthopedics;  Laterality: Right;  Dr. requested 120 minutes   WISDOM TOOTH EXTRACTION     x 1 tooth   Patient Active Problem List   Diagnosis Date Noted   Thoracic aortic aneurysm without rupture (HCC) 01/14/2023   Chronic diastolic heart failure (HCC) 04/23/2022   Ascending aortic aneurysm (HCC) 04/23/2022   Pure hypercholesterolemia 04/18/2021   Non-insulin dependent type 2 diabetes mellitus (HCC) 07/22/2018   RBBB (right bundle branch block) 10/10/2017   History of revision of total replacement of right hip joint 06/18/2017   OA (osteoarthritis) of hip 06/05/2017   Essential hypertension 10/16/2015   Hx of adenomatous colonic polyps    Benign neoplasm of transverse colon    Obesity hypoventilation syndrome (HCC) 04/07/2014   OSA on CPAP 04/07/2014   Obesity, morbid (HCC) 12/21/2013   Nocturia more than twice per night 12/21/2013   Intrinsic  asthma 12/21/2013   Lumbar disc herniation with radiculopathy 06/13/2012    ONSET DATE: 06/03/2023 (referral date)   REFERRING DIAG: R26.9 (ICD-10-CM) - Gait disturbance  THERAPY DIAG:  Abnormality of gait and mobility - Plan: PT plan of care cert/re-cert  Unsteadiness on feet - Plan: PT plan of care cert/re-cert  Other low back pain - Plan: PT plan of care cert/re-cert  Chronic pain of both knees - Plan: PT plan of care cert/re-cert  Repeated falls - Plan: PT plan of care cert/re-cert  Dizziness and giddiness - Plan: PT plan of care cert/re-cert  BPPV (benign paroxysmal positional vertigo), right - Plan: PT plan of care cert/re-cert  Rationale for Evaluation and Treatment: Rehabilitation  SUBJECTIVE:                                                                                                                                                                                             SUBJECTIVE STATEMENT: Patient goes by "Tommy [name]."   Patient reports that he was working on his head turn exercise and he got a little dizzy when he moved his head too quickly. Denies room spinning but has to pace himself. Patient reports that he hasn't done much of his exercises since the "vertigo." Patient would like vestibular system assessed; denies falls and near falls.   Pt accompanied by: self - drives self  PERTINENT HISTORY: suspected polyneuropathy, chronic diastolic heart failure, DMII, vertigo. Aortic aneurysm being monitored and stable at this time  PAIN:  Are you having pain?  None currently - reports pain intermittently in bilateral knees and some tightness   PRECAUTIONS: Fall  RED FLAGS: None   WEIGHT BEARING RESTRICTIONS: No  FALLS: Has patient fallen in last 6 months? Yes. Number of falls 2 falls -  one in yard putting up inflatables on uneven ground, requires assistance to get up if doesn't have walking stick or tree to get up  LIVING ENVIRONMENT: Lives with: lives with  their spouse Lives in: House/apartment Stairs: No  Has following equipment at home: Environmental consultant - 2 wheeled, shower chair, Grab bars, and see above  PLOF: Independent other than putting on socks - retired Teaching laboratory technician   PATIENT GOALS: "I would like to get back a little bit, stop my degeneration, get active enough to lose my last 30 lbs to get my knees replaced."   OBJECTIVE:  Note: Objective measures were completed at Evaluation unless otherwise noted.  DIAGNOSTIC FINDINGS:  01/14/2023 CT Aortic Aneurysm:   IMPRESSION: Grossly stable 4.9 cm Ascending thoracic aortic aneurysm. Recommend semi-annual imaging followup by CTA or MRA and referral to cardiothoracic surgery if not already obtained. This recommendation follows 2010 ACCF/AHA/AATS/ACR/ASA/SCA/SCAI/SIR/STS/SVM Guidelines for the Diagnosis and Management of Patients With Thoracic Aortic Disease. Circulation. 2010; 121: W098-J191. Aortic aneurysm NOS (ICD10-I71.9).   Stable 1.6 cm left adrenal nodule is noted. Attention to this on follow-up imaging is recommended.  COGNITION: Overall cognitive status: Within functional limits for tasks assessed                                                                                                                     TREATMENT:    Vitals:   07/16/23 0811  BP: (!) 145/81  Pulse: 65  SpO2: 96%   Seated at rest on RUE   NMR:  VESTIBULAR ASSESSMENT:  GENERAL OBSERVATION: only wears glasses for reading   SYMPTOM BEHAVIOR:  Subjective history: reports first episode over 30 years ago, turns his head especially back and gets room spinning, reports felt worse when taking meclizine  Non-Vestibular symptoms: headaches, nausea/vomiting, and sinus  Type of dizziness: Imbalance (Disequilibrium), Spinning/Vertigo, Unsteady with head/body turns, Lightheadedness/Faint, "Funny feeling in the head", and "Swimmyheaded"  Frequency: a couple times a year  Duration: improves  within a few hours if sat still, still feel funny a couple minutes, worst episode of spinning lasted an hour  Aggravating factors:  tipping head back, bending over car on hood  Relieving factors: staying still  Progression of symptoms: better  Cervical ROM: WFL   OCULOMOTOR EXAM:  Ocular Alignment: normal  Ocular ROM: No Limitations  Spontaneous Nystagmus: absent  Gaze-Induced Nystagmus: absent  Smooth Pursuits: intact  Saccades: intact  Convergence/Divergence: < 5 cm   Test of Skew: normal bilaterally   VBI: negative bilaterally   VESTIBULAR - OCULAR REFLEX:   Slow VOR: mild correction noted intermittently to the R, none going up and down   VOR Cancellation: Normal  Head-Impulse Test: HIT Right: positive HIT Left: positive  Dynamic Visual Acuity:   Static: 7 Dynamic: 4 Comments: 3 line difference = abnormal   POSITIONAL TESTING:  Right Dix-Hallpike: upbeating, right nystagmus Right Roll Test: no nystagmus Left Roll Test: no nystagmus  Patient requested to be sat up once nystagmus started in  right Gilberto Better so unable to assess duration; appears to be consistent with right BPPV, reports mild dizziness when head turned to left but no nystagmus noted, patient open to assessment further in future sessions but requested wife come to session   Self Care: Educated on examination findings including indication of possible underlying hypofunction and BPPV, recommend further assessment in future session with treatment as indicated   PATIENT EDUCATION: Education details: See above Person educated: Patient Education method: Explanation Education comprehension: verbalized understanding  HOME EXERCISE PROGRAM: Recommend increasing short distance walking 1 lap around home every hour to 30 minutes and 4 outdoor walks this week   Big lateral step return for stepping reaction standing at counter without UE support 1 x 10 reps 1x a day 3 x 10 reps Big backward step return for stepping  reaction standing at counter without UE support 1 x 10 reps 1x a day 3 x 10 reps  Access Code: [email or web address] URL: [email or web address] Date: 07/01/2023 Prepared by: Maryruth Eve  Exercises - Semi-Tandem Corner Balance: Eyes Open With Head Turns  - 1 x daily - 7 x weekly - 2 sets - 5-10 reps - Romberg Stance Eyes Closed on Foam Pad  - 1 x daily - 7 x weekly - 3 sets - 5-10 reps with head turns, 30 seconds with eyes closed only hold  GOALS: Goals reviewed with patient? Yes  SHORT TERM GOALS: Target date: 07/01/2023  Patient will demonstrate independence with initial HEP to continue to progress between physical therapy sessions.   Baseline: To be provided, reports understanding of exercises thus far Goal status: MET  2. to be assessed and LTG written Baseline: Assessed on 1/28 Goal status: MET  3.  FGA to be assessed and LTG written Baseline: Assessed on 1/28 Goal status: MET   LONG TERM GOALS: Target date: updated to 08/20/2023 due to findings of BPPV  Patient will report demonstrate independence with final HEP in order to maintain current gains and continue to progress after physical therapy discharge.   Baseline: To be provided, partial compliance limited by dizziness Goal status: IN PROGRESS  2.  Patient will improve gait speed to 0.88 m/s with LRAD to indicate improvement to the level of community ambulator in order to participate more easily in activities outside of the home.   Baseline: 0.78 m/s without AD Goal status: INITIAL  3.  Patient will improve their 5x Sit to Stand score to less than 16 seconds with SPO2 90% or above to demonstrate a decreased risk for falls and improved LE strength.   Baseline: 19.66 seconds without UE and SPO2 at 90%  Goal status: INITIAL  4.  Patient will improve by 30 feet with SPO2 > 90% to demonstrate progress towards age reported norms for aerobic capacity and endurance in order to participate more easily in daily  walking tasks.    Baseline: 438 feet without AD SPO2 93% Goal status: INITIAL  5.  Patient will improve FGA to greater than 20/30 to indicate a decreased risk of falls and improved dynamic stability.   Baseline: 16/30 Goal status: INITIAL  6.  Patient will present with clear positional testing to indicate resolution of BPPV. Baseline: R positive posterior canal  Goal status: INITIAL  ASSESSMENT:  CLINICAL IMPRESSION: Emphasis on skilled PT session on further vestibular assessment as patient reporting dizziness with HEP; patient presenting with bilateral HIT test and no abnormal central findings to indicate possible true underlying bilateral hypofunction. Patient also presenting with likely  R posterior canal BPPV; however, testing limited as patient requested to be sat up once nystagmus started. Patient opening to continue assessment following education in session but requested to have wife present so she can drive him home as needed. Updated POC to allow for more visits to treat accordingly.  Continue POC as able.   OBJECTIVE IMPAIRMENTS: Abnormal gait, decreased balance, decreased endurance, difficulty walking, decreased ROM, decreased strength, dizziness, obesity, and pain.   ACTIVITY LIMITATIONS: carrying, bending, and dressing  PARTICIPATION LIMITATIONS: community activity and yard work  PERSONAL FACTORS: Age, Past/current experiences, Time since onset of injury/illness/exacerbation, and 3+ comorbidities: see above  are also affecting patient's functional outcome.   REHAB POTENTIAL: Fair limited by multiple commodities and SPO2 fluctuations  CLINICAL DECISION MAKING: Evolving/moderate complexity  EVALUATION COMPLEXITY: Moderate  PLAN:  PT FREQUENCY: 1x/week + 2x/week   PT DURATION: 6 weeks + 3 weeks   PLANNED INTERVENTIONS: 97164- PT Re-evaluation, 97110-Therapeutic exercises, 97530- Therapeutic activity, 97112- Neuromuscular re-education, 97535- Self Care, 47829- Manual  therapy, [phone]- Gait training, [phone]- Canalith repositioning, and Dry Needling  PLAN FOR NEXT SESSION: work on stepping reaction and adding band pull aparts, balance on compliant surface as tolerated, basic knee exercises as indicated for pain management, trial of trekking poles for outdoor walking, med Medical illustrator as back allows and hip hinging, how is HEP going   Backwards stepping, add to HEP with modified SLS stepping work, updates on follow up with cardiology    Assess for BPPV and treat as indicated (do not use canalith code - will not work), R positive - test left as indicated, add habituation exercises once BPPV resolved    Carmelia Bake, PT, DPT 07/16/2023, 10:03 AM

## 2023-07-17 ENCOUNTER — Encounter (HOSPITAL_BASED_OUTPATIENT_CLINIC_OR_DEPARTMENT_OTHER): Payer: Self-pay

## 2023-07-18 NOTE — Telephone Encounter (Signed)
 Okay to use 72hold week of 3/10 for sooner OV.   Alver Sorrow, NP

## 2023-07-19 ENCOUNTER — Ambulatory Visit: Payer: Self-pay | Admitting: Physical Therapy

## 2023-07-19 ENCOUNTER — Encounter: Payer: Self-pay | Admitting: Physical Therapy

## 2023-07-19 VITALS — BP 127/67 | HR 61

## 2023-07-19 DIAGNOSIS — H8111 Benign paroxysmal vertigo, right ear: Secondary | ICD-10-CM | POA: Diagnosis not present

## 2023-07-19 DIAGNOSIS — M25562 Pain in left knee: Secondary | ICD-10-CM | POA: Diagnosis not present

## 2023-07-19 DIAGNOSIS — R269 Unspecified abnormalities of gait and mobility: Secondary | ICD-10-CM

## 2023-07-19 DIAGNOSIS — R2681 Unsteadiness on feet: Secondary | ICD-10-CM

## 2023-07-19 DIAGNOSIS — M25561 Pain in right knee: Secondary | ICD-10-CM | POA: Diagnosis not present

## 2023-07-19 DIAGNOSIS — G8929 Other chronic pain: Secondary | ICD-10-CM | POA: Diagnosis not present

## 2023-07-19 DIAGNOSIS — R42 Dizziness and giddiness: Secondary | ICD-10-CM | POA: Diagnosis not present

## 2023-07-19 DIAGNOSIS — M5459 Other low back pain: Secondary | ICD-10-CM | POA: Diagnosis not present

## 2023-07-19 DIAGNOSIS — R296 Repeated falls: Secondary | ICD-10-CM | POA: Diagnosis not present

## 2023-07-19 NOTE — Therapy (Signed)
 OUTPATIENT PHYSICAL THERAPY NEURO TREATMENT   Patient Name: Tommy [name] MRN: [id number] DOB:[birth year], 78 y.o., male Today's Date: 07/19/2023   PCP: Tommy Corn, MD REFERRING PROVIDER: Edwin [name], DPM  END OF SESSION:  PT End of Session - 07/19/23 1101     Visit Number 6    Number of Visits 11    Date for PT Re-Evaluation 08/20/23    Authorization Type [employer]    Authorization - Visit Number 5    Authorization - Number of Visits 8    PT Start Time 1100    PT Stop Time 1133    PT Time Calculation (min) 33 min    Equipment Utilized During Treatment Other (comment)    Activity Tolerance Patient tolerated treatment well    Behavior During Therapy [facility] for tasks assessed/performed;Anxious              Past Medical History:  Diagnosis Date   Allergy    Anemia    "as teenager"   Arthritis    hands, hip   Ascending aortic aneurysm (HCC) 04/23/2022   Back pain    BPPV (benign paroxysmal positional vertigo)    History of   Cancer (HCC)    "melanoma removed from leg years ago", malignant mole   Cataract    Chronic back pain    Chronic diastolic heart failure (HCC) 04/23/2022   Complication of anesthesia    woke up and was confused and combative after back surgery   Constipation    DDD (degenerative disc disease), lumbar    Diabetes mellitus without complication (HCC)    Diabetic neuropathy (HCC)    ED (erectile dysfunction)    Essential hypertension 10/16/2015   Fatty liver    First degree AV block    EKG 12/03/16   GERD (gastroesophageal reflux disease)    diet controlled, no meds currently, history of   H/O hiatal hernia    History of elevated PSA    Nocturia more than twice per night 12/21/2013   OA (osteoarthritis)    Obesity hypoventilation syndrome (HCC) 04/07/2014   Obesity, morbid (HCC) 12/21/2013   OSA on CPAP 04/07/2014   Pure hypercholesterolemia 04/18/2021   RBBB    previous EKG   Sleep apnea    uses CPAP nightly    Snoring 12/21/2013   Spondylosis 2014   lumbar back, noted on MR    Ulcer    stomach ulcer "as teenager"   Past Surgical History:  Procedure Laterality Date   BACK SURGERY     "done approx. 24 years ago"   CARDIOVASCULAR STRESS TEST     "done approx 10 years ago, Dr. Timothy [name]"   COLONOSCOPY  2006   hx of "with removal of polyps"/[facility]   COLONOSCOPY WITH PROPOFOL N/A 09/06/2015   Procedure: COLONOSCOPY WITH PROPOFOL;  Surgeon: Tommy Dare, MD;  Location: [facility];  Service: Endoscopy;  Laterality: N/A;   lipoma removed     from leg   LUMBAR LAMINECTOMY/DECOMPRESSION MICRODISCECTOMY  06/19/2012   Procedure: LUMBAR LAMINECTOMY/DECOMPRESSION MICRODISCECTOMY 1 LEVEL;  Surgeon: Tommy Lick, MD;  Location: [facility];  Service: Orthopedics;  Laterality: Left;  L2-3 LEFT MICRODISCECTOMY   thigh surgery     fatty deposit   TONSILLECTOMY     TOTAL HIP ARTHROPLASTY Right 06/05/2017   Procedure: RIGHT TOTAL HIP ARTHROPLASTY ANTERIOR APPROACH;  Surgeon: Tommy Gross, MD;  Location: [facility];  Service: Orthopedics;  Laterality: Right;  Dr. requested 120 minutes   WISDOM TOOTH  EXTRACTION     x 1 tooth   Patient Active Problem List   Diagnosis Date Noted   Thoracic aortic aneurysm without rupture (HCC) 01/14/2023   Chronic diastolic heart failure (HCC) 04/23/2022   Ascending aortic aneurysm (HCC) 04/23/2022   Pure hypercholesterolemia 04/18/2021   Non-insulin dependent type 2 diabetes mellitus (HCC) 07/22/2018   RBBB (right bundle branch block) 10/10/2017   History of revision of total replacement of right hip joint 06/18/2017   OA (osteoarthritis) of hip 06/05/2017   Essential hypertension 10/16/2015   Hx of adenomatous colonic polyps    Benign neoplasm of transverse colon    Obesity hypoventilation syndrome (HCC) 04/07/2014   OSA on CPAP 04/07/2014   Obesity, morbid (HCC) 12/21/2013   Nocturia more than twice per night 12/21/2013   Intrinsic asthma 12/21/2013   Lumbar disc  herniation with radiculopathy 06/13/2012    ONSET DATE: 06/03/2023 (referral date)   REFERRING DIAG: R26.9 (ICD-10-CM) - Gait disturbance  THERAPY DIAG:  Abnormality of gait and mobility  Unsteadiness on feet  Dizziness and giddiness  BPPV (benign paroxysmal positional vertigo), right  Rationale for Evaluation and Treatment: Rehabilitation  SUBJECTIVE:                                                                                                                                                                                             SUBJECTIVE STATEMENT: Patient goes by "Tommy [name]."   Patient reports that he is doing well. Denies falls and near falls. Still has some mild dizziness and queasiness.   Pt accompanied by: self - drives self  PERTINENT HISTORY: suspected polyneuropathy, chronic diastolic heart failure, DMII, vertigo. Aortic aneurysm being monitored and stable at this time  PAIN:  Are you having pain?  None currently - reports pain intermittently in bilateral knees and some tightness   PRECAUTIONS: Fall  RED FLAGS: None   WEIGHT BEARING RESTRICTIONS: No  FALLS: Has patient fallen in last 6 months? Yes. 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  DIAGNOSTIC FINDINGS:  01/14/2023 CT Aortic Aneurysm:   IMPRESSION: Grossly stable 4.9 cm  Ascending thoracic aortic aneurysm. Recommend semi-annual imaging followup by CTA or MRA and referral to cardiothoracic surgery if not already obtained. This recommendation follows 2010 ACCF/AHA/AATS/ACR/ASA/SCA/SCAI/SIR/STS/SVM Guidelines for the Diagnosis and Management of Patients With Thoracic Aortic Disease. Circulation. 2010; 121: Z610-R604. Aortic aneurysm NOS (ICD10-I71.9).   Stable 1.6 cm left adrenal nodule is noted. Attention to this on follow-up imaging is recommended.  COGNITION: Overall cognitive status: Within functional limits for tasks assessed                                                                                                                     TREATMENT:    Vitals:   07/19/23 1106  BP: 127/67  Pulse: 61   Seated at rest on RUE   NMR:   POSITIONAL TESTING:  Right Dix-Hallpike: upbeating, right nystagmus Left Dix-Hallpike: no nystagmus  VESTIBULAR TREATMENT:  Canalith Repositioning:  Epley Right: Number of Reps: 1, Response to Treatment: symptoms resolved, and Comment: initial BPPV noted in R [employer] with right torsional upbeating nystagmus with 3 second delay and 10 second duration, patient reporting mild dizziness in position 2 and 3 and mild upon sitting up, when rechecked all nystagmus was clear, patient required max cues to keep head in extended position on first rep   SPO2 assessed over course of treatment and held steady at 93% with cues for deep breathing throughout  Educated patient on etiology of BPPV as well as how PT can help with positional maneuvers, showed 3D vestibular system model, provided APTA patient resource on BPPV for further education  PATIENT EDUCATION: Education details: See above Person educated: Patient Education method: Explanation Education comprehension: verbalized understanding  HOME EXERCISE PROGRAM: Recommend increasing short distance walking 1 lap around home every hour to 30 minutes and 4 outdoor  walks this week   Big lateral step return for stepping reaction standing at counter without UE support 1 x 10 reps 1x a day 3 x 10 reps Big backward step return for stepping reaction standing at counter without UE support 1 x 10 reps 1x a day 3 x 10 reps  Access Code: [email or web address] URL: [email or web address] Date: 07/01/2023 Prepared by: Maryruth Eve  Exercises - Semi-Tandem Corner Balance: Eyes Open With Head Turns  - 1 x daily - 7 x weekly - 2 sets - 5-10 reps - Romberg Stance Eyes Closed on Foam Pad  - 1 x daily - 7 x weekly - 3 sets - 5-10 reps with head turns, 30 seconds with eyes closed only hold  GOALS: Goals reviewed with patient? Yes  SHORT TERM GOALS: Target date: 07/01/2023  Patient will demonstrate independence with initial HEP to continue to progress between physical therapy sessions.   Baseline: To be provided, reports understanding of exercises thus far Goal status: MET  2. to be assessed and LTG written Baseline: Assessed on 1/28 Goal status: MET  3.  FGA to be assessed and LTG written Baseline: Assessed on 1/28 Goal status: MET   LONG TERM GOALS: Target date: updated to 08/20/2023 due to findings of BPPV  Patient will report demonstrate independence with final HEP in order to maintain current gains and continue to progress after physical therapy discharge.   Baseline: To be provided, partial compliance limited by dizziness Goal status: IN PROGRESS  2.  Patient will improve gait speed to 0.88 m/s with LRAD to indicate improvement to the level of community ambulator in order to participate more easily in activities outside of the home.   Baseline: 0.78 m/s without AD Goal status: INITIAL  3.  Patient will improve their 5x Sit to Stand score to less than 16 seconds with SPO2 90% or above to demonstrate a decreased risk for falls and improved LE strength.   Baseline: 19.66 seconds without UE and SPO2 at 90%  Goal status: INITIAL  4.  Patient will  improve by 30 feet with SPO2 > 90% to demonstrate progress towards age reported norms for aerobic capacity and endurance in order to participate more easily in daily walking tasks.    Baseline: 438 feet without AD SPO2 93% Goal status: INITIAL  5.  Patient will improve FGA to greater than 20/30 to indicate a decreased risk of falls and improved dynamic stability.   Baseline: 16/30 Goal status: INITIAL  6.  Patient will present with clear positional testing to indicate resolution of BPPV. Baseline: R positive posterior canal  Goal status: INITIAL  ASSESSMENT:  CLINICAL IMPRESSION: Emphasis on skilled PT session on re-assessment of positional testing and treatment as indicated. Patient presenting with symptoms consistent R posterior canalithiasis that resolved with 1 Epley maneuver. Oxygen closely monitored and held steady at 93%. Patient tolerated session well. Continue POC.  OBJECTIVE IMPAIRMENTS: Abnormal gait, decreased balance, decreased endurance, difficulty walking, decreased ROM, decreased strength, dizziness, obesity, and pain.   ACTIVITY LIMITATIONS: carrying, bending, and dressing  PARTICIPATION LIMITATIONS: community activity and yard work  PERSONAL FACTORS: Age, Past/current experiences, Time since onset of injury/illness/exacerbation, and 3+ comorbidities: see above  are also affecting patient's functional outcome.   REHAB POTENTIAL: Fair limited by multiple commodities and SPO2 fluctuations  CLINICAL DECISION MAKING: Evolving/moderate complexity  EVALUATION COMPLEXITY: Moderate  PLAN:  PT FREQUENCY: 1x/week + 2x/week   PT DURATION: 6 weeks + 3 weeks   PLANNED INTERVENTIONS: 97164- PT Re-evaluation, 97110-Therapeutic exercises, 97530- Therapeutic activity, 97112- Neuromuscular re-education, 97535- Self Care, 16109- Manual therapy, [phone]- Gait training, [phone]- Canalith repositioning, and Dry Needling  PLAN FOR NEXT SESSION: work on stepping reaction and adding  band pull aparts, balance on compliant surface as tolerated, basic knee exercises as indicated for pain management, trial of trekking poles for outdoor walking, med Medical illustrator as back allows and hip hinging, how is HEP going   Backwards stepping, add to HEP with modified SLS stepping work, updates on follow up with cardiology    Re-assess for BPPV and treat as indicated (do not use canalith code - will not work), R positive - anticipate D/C next session if still resolved    Carmelia Bake, PT, DPT 07/19/2023, 11:43 AM

## 2023-07-22 ENCOUNTER — Other Ambulatory Visit: Payer: Self-pay | Admitting: Cardiovascular Disease

## 2023-07-22 ENCOUNTER — Ambulatory Visit: Payer: Medicare PPO | Attending: Podiatry | Admitting: Physical Therapy

## 2023-07-22 ENCOUNTER — Encounter: Payer: Self-pay | Admitting: Physical Therapy

## 2023-07-22 VITALS — BP 139/77 | HR 66

## 2023-07-22 DIAGNOSIS — R42 Dizziness and giddiness: Secondary | ICD-10-CM | POA: Diagnosis not present

## 2023-07-22 DIAGNOSIS — R2681 Unsteadiness on feet: Secondary | ICD-10-CM | POA: Diagnosis not present

## 2023-07-22 DIAGNOSIS — R269 Unspecified abnormalities of gait and mobility: Secondary | ICD-10-CM | POA: Diagnosis not present

## 2023-07-22 NOTE — Therapy (Signed)
 OUTPATIENT PHYSICAL THERAPY NEURO TREATMENT   Patient Name: Tommy [name] MRN: [id number] DOB:Sep 23, 1945, 78 y.o., male Today's Date: 07/22/2023   PCP: Creola Corn, MD REFERRING PROVIDER: Edwin Cap, DPM  END OF SESSION:  PT End of Session - 07/22/23 0804     Visit Number 7    Number of Visits 11    Date for PT Re-Evaluation 08/20/23    Authorization Type [employer]    Authorization - Visit Number 6    Authorization - Number of Visits 8    PT Start Time 0801    PT Stop Time 0841    PT Time Calculation (min) 40 min    Equipment Utilized During Treatment Gait belt    Activity Tolerance Patient tolerated treatment well    Behavior During Therapy [facility] for tasks assessed/performed;Anxious              Past Medical History:  Diagnosis Date   Allergy    Anemia    "as teenager"   Arthritis    hands, hip   Ascending aortic aneurysm (HCC) 04/23/2022   Back pain    BPPV (benign paroxysmal positional vertigo)    History of   Cancer (HCC)    "melanoma removed from leg years ago", malignant mole   Cataract    Chronic back pain    Chronic diastolic heart failure (HCC) 04/23/2022   Complication of anesthesia    woke up and was confused and combative after back surgery   Constipation    DDD (degenerative disc disease), lumbar    Diabetes mellitus without complication (HCC)    Diabetic neuropathy (HCC)    ED (erectile dysfunction)    Essential hypertension 10/16/2015   Fatty liver    First degree AV block    EKG 12/03/16   GERD (gastroesophageal reflux disease)    diet controlled, no meds currently, history of   H/O hiatal hernia    History of elevated PSA    Nocturia more than twice per night 12/21/2013   OA (osteoarthritis)    Obesity hypoventilation syndrome (HCC) 04/07/2014   Obesity, morbid (HCC) 12/21/2013   OSA on CPAP 04/07/2014   Pure hypercholesterolemia 04/18/2021   RBBB    previous EKG   Sleep apnea    uses CPAP nightly   Snoring  12/21/2013   Spondylosis 2014   lumbar back, noted on MR    Ulcer    stomach ulcer "as teenager"   Past Surgical History:  Procedure Laterality Date   BACK SURGERY     "done approx. 24 years ago"   CARDIOVASCULAR STRESS TEST     "done approx 10 years ago, Dr. Timothy Lasso"   COLONOSCOPY  2006   hx of "with removal of polyps"/[facility]   COLONOSCOPY WITH PROPOFOL N/A 09/06/2015   Procedure: COLONOSCOPY WITH PROPOFOL;  Surgeon: Meryl Dare, MD;  Location: [facility];  Service: Endoscopy;  Laterality: N/A;   lipoma removed     from leg   LUMBAR LAMINECTOMY/DECOMPRESSION MICRODISCECTOMY  06/19/2012   Procedure: LUMBAR LAMINECTOMY/DECOMPRESSION MICRODISCECTOMY 1 LEVEL;  Surgeon: Venita Lick, MD;  Location: [facility];  Service: Orthopedics;  Laterality: Left;  L2-3 LEFT MICRODISCECTOMY   thigh surgery     fatty deposit   TONSILLECTOMY     TOTAL HIP ARTHROPLASTY Right 06/05/2017   Procedure: RIGHT TOTAL HIP ARTHROPLASTY ANTERIOR APPROACH;  Surgeon: Ollen Gross, MD;  Location: [facility];  Service: Orthopedics;  Laterality: Right;  Dr. requested 120 minutes   WISDOM TOOTH  EXTRACTION     x 1 tooth   Patient Active Problem List   Diagnosis Date Noted   Thoracic aortic aneurysm without rupture (HCC) 01/14/2023   Chronic diastolic heart failure (HCC) 04/23/2022   Ascending aortic aneurysm (HCC) 04/23/2022   Pure hypercholesterolemia 04/18/2021   Non-insulin dependent type 2 diabetes mellitus (HCC) 07/22/2018   RBBB (right bundle branch block) 10/10/2017   History of revision of total replacement of right hip joint 06/18/2017   OA (osteoarthritis) of hip 06/05/2017   Essential hypertension 10/16/2015   Hx of adenomatous colonic polyps    Benign neoplasm of transverse colon    Obesity hypoventilation syndrome (HCC) 04/07/2014   OSA on CPAP 04/07/2014   Obesity, morbid (HCC) 12/21/2013   Nocturia more than twice per night 12/21/2013   Intrinsic asthma 12/21/2013   Lumbar disc herniation with  radiculopathy 06/13/2012    ONSET DATE: 06/03/2023 (referral date)   REFERRING DIAG: R26.9 (ICD-10-CM) - Gait disturbance  THERAPY DIAG:  Abnormality of gait and mobility  Unsteadiness on feet  Dizziness and giddiness  Rationale for Evaluation and Treatment: Rehabilitation  SUBJECTIVE:                                                                                                                                                                                             SUBJECTIVE STATEMENT: Patient goes by "Tommy [name]."   Patient reports that he is doing well overall. Patient denies any other acute changes. Reports only mild swimminess.   Pt accompanied by: self - drives self  PERTINENT HISTORY: suspected polyneuropathy, chronic diastolic heart failure, DMII, vertigo. Aortic aneurysm being monitored and stable at this time  PAIN:  Are you having pain?  None currently - reports pain intermittently in bilateral knees and some tightness   PRECAUTIONS: Fall  RED FLAGS: None   WEIGHT BEARING RESTRICTIONS: No  FALLS: Has patient fallen in last 6 months? Yes. 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  DIAGNOSTIC  FINDINGS:  01/14/2023 CT Aortic Aneurysm:   IMPRESSION: Grossly stable 4.9 cm Ascending thoracic aortic aneurysm. Recommend semi-annual imaging  followup by CTA or MRA and referral to cardiothoracic surgery if not already obtained. This recommendation follows 2010 ACCF/AHA/AATS/ACR/ASA/SCA/SCAI/SIR/STS/SVM Guidelines for the Diagnosis and Management of Patients With Thoracic Aortic Disease. Circulation. 2010; 121: L875-I433. Aortic aneurysm NOS (ICD10-I71.9).   Stable 1.6 cm left adrenal nodule is noted. Attention to this on follow-up imaging is recommended.  COGNITION: Overall cognitive status: Within functional limits for tasks assessed                                                                                                                     TREATMENT:    Self Care: Vitals:   07/22/23 0808  BP: 139/77  Pulse: 66   Seated at rest on RUE   Assessed vitals as noted above; discussed POC over future sessions and educated on how to manage reoccurrence of BPPV  NMR:   POSITIONAL TESTING:  Right Dix-Hallpike: upbeating, right nystagmus  VESTIBULAR TREATMENT:  Canalith Repositioning:  Epley Right: Number of Reps: 2, Response to Treatment: symptoms resolved, and Comment: initial BPPV noted in R [employer] with right torsional upbeating nystagmus with 5 second delay and 7 second duration, patient reporting mild dizziness in position 3 and mild upon sitting up, when rechecked all nystagmus was clear but performed second Epley as sitting straight up tends to aggravate low back, rechecked with bilateral sidelying to L and R and no nystagmus noted   TherAct:   : 541 without AD and modI at 94% SPO2   [facility] PT Assessment - 07/22/23 0001       Standardized Balance Assessment   Standardized Balance Assessment Five Times Sit to Stand;10 meter walk test    Five times sit to stand comments  12.41   seconds without UE use (SBA - SPO2 94%)   10 Meter Walk 1.13   m/s without AD (modI)             PATIENT EDUCATION: Education details: See above Person educated: Patient Education method: Explanation Education  comprehension: verbalized understanding  HOME EXERCISE PROGRAM: Recommend increasing short distance walking 1 lap around home every hour to 30 minutes and 4 outdoor walks this week   Big lateral step return for stepping reaction standing at counter without UE support 1 x 10 reps 1x a day 3 x 10 reps Big backward step return for stepping reaction standing at counter without UE support 1 x 10 reps 1x a day 3 x 10 reps  Access Code: [email or web address] URL: [email or web address] Date: 07/01/2023 Prepared by: Maryruth Eve  Exercises - Semi-Tandem Corner Balance: Eyes Open With Head Turns  - 1 x daily - 7 x weekly - 2 sets - 5-10 reps - Romberg Stance Eyes Closed on Foam Pad  - 1 x daily - 7 x weekly - 3 sets - 5-10 reps with head turns, 30 seconds with eyes closed only hold  GOALS: Goals reviewed  with patient? Yes  SHORT TERM GOALS: Target date: 07/01/2023  Patient will demonstrate independence with initial HEP to continue to progress between physical therapy sessions.   Baseline: To be provided, reports understanding of exercises thus far Goal status: MET  2. to be assessed and LTG written Baseline: Assessed on 1/28 Goal status: MET  3.  FGA to be assessed and LTG written Baseline: Assessed on 1/28 Goal status: MET   LONG TERM GOALS: Target date: updated to 08/20/2023 due to findings of BPPV  Patient will report demonstrate independence with final HEP in order to maintain current gains and continue to progress after physical therapy discharge.   Baseline: To be provided, partial compliance limited by dizziness Goal status: IN PROGRESS  2.  Patient will improve gait speed to 0.88 m/s with LRAD to indicate improvement to the level of community ambulator in order to participate more easily in activities outside of the home.   Baseline: 0.78 m/s without AD Goal status: INITIAL  3.  Patient will improve their 5x Sit to Stand score to less than 16 seconds with SPO2 90% or  above to demonstrate a decreased risk for falls and improved LE strength.   Baseline: 19.66 seconds without UE and SPO2 at 90%; improved to 12.41 seconds without UE use SPO2 94% Goal status: MET  4.  Patient will improve by 30 feet with SPO2 > 90% to demonstrate progress towards age reported norms for aerobic capacity and endurance in order to participate more easily in daily walking tasks.    Baseline: 438 feet without AD SPO2 93%; 541 without AD and modI at 94% SPO2 Goal status: MET  5.  Patient will improve FGA to greater than 20/30 to indicate a decreased risk of falls and improved dynamic stability.   Baseline: 16/30 Goal status: INITIAL  6.  Patient will present with clear positional testing to indicate resolution of BPPV. Baseline: R positive posterior canal  Goal status: INITIAL  ASSESSMENT:  CLINICAL IMPRESSION: Emphasis on skilled PT session on re-assessment of positional testing and treatment as indicated. Patient presenting with symptoms consistent with onoging R posterior canalithiasis that resolved with 1 Epley maneuver. Finished by assessing part of LTG with pateint progressing excellently with improved times and oxygen readings indicating decreased risk for falls and improved cardiorespiratory endurance. Continue POC.  OBJECTIVE IMPAIRMENTS: Abnormal gait, decreased balance, decreased endurance, difficulty walking, decreased ROM, decreased strength, dizziness, obesity, and pain.   ACTIVITY LIMITATIONS: carrying, bending, and dressing  PARTICIPATION LIMITATIONS: community activity and yard work  PERSONAL FACTORS: Age, Past/current experiences, Time since onset of injury/illness/exacerbation, and 3+ comorbidities: see above  are also affecting patient's functional outcome.   REHAB POTENTIAL: Fair limited by multiple commodities and SPO2 fluctuations  CLINICAL DECISION MAKING: Evolving/moderate complexity  EVALUATION COMPLEXITY: Moderate  PLAN:  PT FREQUENCY:  1x/week + 2x/week   PT DURATION: 6 weeks + 3 weeks   PLANNED INTERVENTIONS: 97164- PT Re-evaluation, 97110-Therapeutic exercises, 97530- Therapeutic activity, 97112- Neuromuscular re-education, 97535- Self Care, 60454- Manual therapy, 97116- Gait training, [phone]- Canalith repositioning, and Dry Needling  PLAN FOR NEXT SESSION:   (a Re-assess for BPPV and treat as indicated (do not use canalith code - will not work), R positive - anticipate D/C next session if still resolved (assess FGA)   Carmelia Bake, PT, DPT 07/22/2023, 8:46 AM

## 2023-07-25 ENCOUNTER — Ambulatory Visit: Payer: Self-pay | Admitting: Physical Therapy

## 2023-07-29 ENCOUNTER — Encounter (HOSPITAL_BASED_OUTPATIENT_CLINIC_OR_DEPARTMENT_OTHER): Payer: Self-pay | Admitting: Family

## 2023-07-29 ENCOUNTER — Ambulatory Visit: Payer: Self-pay | Admitting: Physical Therapy

## 2023-07-29 ENCOUNTER — Ambulatory Visit (INDEPENDENT_AMBULATORY_CARE_PROVIDER_SITE_OTHER): Payer: Medicare PPO | Admitting: Family

## 2023-07-29 VITALS — BP 138/82 | HR 74 | Ht 73.5 in | Wt 339.2 lb

## 2023-07-29 DIAGNOSIS — I10 Essential (primary) hypertension: Secondary | ICD-10-CM

## 2023-07-29 DIAGNOSIS — I7121 Aneurysm of the ascending aorta, without rupture: Secondary | ICD-10-CM | POA: Diagnosis not present

## 2023-07-29 DIAGNOSIS — I452 Bifascicular block: Secondary | ICD-10-CM | POA: Insufficient documentation

## 2023-07-29 DIAGNOSIS — G4733 Obstructive sleep apnea (adult) (pediatric): Secondary | ICD-10-CM | POA: Diagnosis not present

## 2023-07-29 DIAGNOSIS — I5032 Chronic diastolic (congestive) heart failure: Secondary | ICD-10-CM

## 2023-07-29 MED ORDER — DOXAZOSIN MESYLATE 2 MG PO TABS: 2.0000 mg | ORAL_TABLET | Freq: Every day | ORAL | 11 refills | Status: DC

## 2023-07-29 MED ORDER — DOXAZOSIN MESYLATE 8 MG PO TABS: 4.0000 mg | ORAL_TABLET | Freq: Two times a day (BID) | ORAL | 3 refills | Status: DC

## 2023-07-29 NOTE — Progress Notes (Signed)
 Cardiology Office Note:  .   Date:  07/29/2023  ID:  Conley Simmonds, DOB [birth year], MRN [id number] PCP: Creola Corn, MD  [facility] Providers Cardiologist:  Chilton Si, MD    History of Present Illness: .   DEWAYNE SEVERE is a 78 y.o. male with history of asthma, HTN, GERD, fatty liver disease, OSA on CPAP, HFpEF, ascending aortic aneurysm, morbid obesity, aortic atherosclerosis.   Prior workup myoview 10/2015 negative for ischemia, but Ef reported 30-44%. Subsequent echo normal LVEf and mild LVH. Diastolic function abnormal but degree of dysfunction not reported.   Doxazosin previously increased to 6mg  BID. Echo 08/2020 LVEF 60-65%, gr1dd, ascending aorta 4.5 unchanged from 2017.   Seen 04/2022 with successful 30 lbs weight loss on Mounjaro. Due to LE edema, repeat echo 04/2022 LVEF 55-60%, mild LVH, gr1DD, trivial AI/MR, ascending aortic aneurysm 49 mm, aneurysm of aortic root 45 mm, ER aneurysm of the aortic arch 46 mm.  CTA chest/aorta 06/2022 revealed ascending thoracic aneurysm 5.9 cm, indeterminate 16mm left adrenal nodule probable for adrenal adenoma, small hiatal hernia.  He was referred to cardiothoracic surgery.  At visit 10/23/22 Doxazosin increased to 8 mg twice daily for BP control.  Seen by cardiothoracic surgery 01/14/2023 with CT images aortic root 4.5 cm, ascending aorta 4.5 cm, ascending aorta 4.7 cm.  Will meet criteria for surgery only if measurements are placed 5.5 cm diameter.  Was recommended for continued BP control and repeat CTA in 1 year.  At visit 03/2023 BP controlled, no changes made.  Presents today for follow up. Recently completed PT for neuropathy related pain and some vestibular therapy. Exercise limited by knee pain, does have improvement with cortisone and gel injections with orthopedics. He noted escalation in BP up to 150s prior to medications and scheduled follow up. He was advised to ensure taking Doxazosin 12 hours apart and since  that time BP after medications most often 130-140. He feels his BP actually escalated after increasing dose of Doxazosin. No recent dietary changes or increased salt intake.   ROS: Please see the history of present illness.    All other systems reviewed and are negative.   Studies Reviewed: .           Risk Assessment/Calculations:             Physical Exam:   VS:  BP 138/82   Pulse 74   Ht 6' 1.5" (1.867 m)   Wt (!) 339 lb 3.2 oz (153.9 kg)   BMI 44.15 kg/m    Wt Readings from Last 3 Encounters:  07/29/23 (!) 339 lb 3.2 oz (153.9 kg)  06/03/23 (!) 335 lb 12.8 oz (152.3 kg)  04/16/23 (!) 335 lb 12.8 oz (152.3 kg)    Vitals:   07/29/23 0838 07/29/23 0855  BP: (!) 144/62 138/82  Pulse: 74   Height: 6' 1.5" (1.867 m)   Weight: (!) 339 lb 3.2 oz (153.9 kg)   BMI (Calculated): 44.14     GEN: Well nourished, overweight, well developed in no acute distress NECK: No JVD; No carotid bruits CARDIAC: RRR, no murmurs, rubs, gallops RESPIRATORY:  Clear to auscultation without rales, wheezing or rhonchi  ABDOMEN: Soft, non-tender, non-distended EXTREMITIES:  No edema; No deformity   ASSESSMENT AND PLAN: .    Ascending aortic aneurysm- Follows with [facility]. Last seen 12/2022 recommended for CT in 1 year and optimal BP control. Defer BB due to bifasicular heart block.   HFpEF/HTN- BP not at goal <  130/80. Euvolemic and well compensated on exam. Feels BP actually increased on Doxaozin when transitioned from 6 to 8mg . Reduce Doxazoisn to 6mg  BID.  Given escalation in BP without clear cause, plan for secondary hypertension workup: renal artery duplex, plasma metanephrines, renin-aldosterone, TSH. Check in via MyChart in 1-2 weeks. If BP persistently elevated, plan to add Spironolactone vs more routine dosing of Lasix.   HLD /aortic atherosclerosis- 01/2023 LDL 34. Continue Rosuvastatin 5 mg daily.   Morbid obesity- Weight loss via diet and exercise encouraged. Discussed the impact being  overweight would have on cardiovascular risk.Feels weight loss has plateued on Mounjaro but A1c has improved to <6.   OSA- CPAP compliance encouraged. Endorses wearing regularly.    Bifascicular block-stable RBBB and LAFB by EKG.  Continue to monitor with periodic EKG.  Would avoid AV nodal blocking therapy.       Dispo: follow up in May as scheduled  Signed, Alver Sorrow, NP

## 2023-07-29 NOTE — Patient Instructions (Addendum)
 Medication Instructions:  Your physician has recommended you make the following change in your medication:   Change: Doxazosin 6mg  daily ( please take 1/2 of 8mg  tablet and 1 2mg  tablet)   *If you need a refill on your cardiac medications before your next appointment, please call your pharmacy*   Lab Work: Your physician recommends that you return for lab work today- TSH, Metanephrines, and Renin- Aldosterone   If you have labs (blood work) drawn today and your tests are completely normal, you will receive your results only by: MyChart Message (if you have MyChart) OR A paper copy in the mail If you have any lab test that is abnormal or we need to change your treatment, we will call you to review the results.   Testing/Procedures: Your physician has requested that you have a renal artery duplex. During this test, an ultrasound is used to evaluate blood flow to the kidneys. Allow one hour for this exam. Do not eat after midnight the day before and avoid carbonated beverages. Take your medications as you usually do.    Follow-Up: At [facility], you and your health needs are our priority.  As part of our continuing mission to provide you with exceptional heart care, we have created designated Provider Care Teams.  These Care Teams include your primary Cardiologist (physician) and Advanced Practice Providers (APPs -  Physician Assistants and Nurse Practitioners) who all work together to provide you with the care you need, when you need it.  We recommend signing up for the patient portal called "MyChart".  Sign up information is provided on this After Visit Summary.  MyChart is used to connect with patients for Virtual Visits (Telemedicine).  Patients are able to view lab/test results, encounter notes, upcoming appointments, etc.  Non-urgent messages can be sent to your provider as well.   To learn more about what you can do with MyChart, go to [email or web address].    Your next  appointment:   Follow up as scheduled   Other Instructions We will check in on blood pressure in about a week.

## 2023-07-31 ENCOUNTER — Ambulatory Visit: Payer: Self-pay | Admitting: Physical Therapy

## 2023-08-04 LAB — TSH: TSH: 3.2 u[IU]/mL (ref 0.450–4.500)

## 2023-08-04 LAB — METANEPHRINES, PLASMA
Metanephrine, Free: <25 pg/mL (ref 0.0–88.0)
Normetanephrine, Free: 60.3 pg/mL (ref 0.0–285.2)

## 2023-08-04 LAB — ALDOSTERONE + RENIN ACTIVITY W/ RATIO
Aldos/Renin Ratio: 27.9 (ref 0.0–30.0)
Aldosterone: 5.6 ng/dL (ref 0.0–30.0)
Renin Activity, Plasma: 0.201 ng/mL/h (ref 0.167–5.380)

## 2023-08-12 ENCOUNTER — Encounter (HOSPITAL_BASED_OUTPATIENT_CLINIC_OR_DEPARTMENT_OTHER): Payer: Self-pay

## 2023-08-12 DIAGNOSIS — I10 Essential (primary) hypertension: Secondary | ICD-10-CM

## 2023-08-12 DIAGNOSIS — Z5181 Encounter for therapeutic drug level monitoring: Secondary | ICD-10-CM

## 2023-08-12 NOTE — Telephone Encounter (Signed)
BP log as requested 

## 2023-08-13 NOTE — Telephone Encounter (Signed)
 Following up

## 2023-08-15 MED ORDER — SPIRONOLACTONE 25 MG PO TABS
12.5000 mg | ORAL_TABLET | Freq: Every day | ORAL | 3 refills | Status: DC
Start: 2023-08-15 — End: 2023-09-23

## 2023-08-15 NOTE — Addendum Note (Signed)
 Addended by: Marlene Lard on: 08/15/2023 08:00 AM   Modules accepted: Orders

## 2023-08-19 NOTE — Telephone Encounter (Signed)
**Note De-identified  [place] Obfuscation** Please advise 

## 2023-08-23 ENCOUNTER — Ambulatory Visit (INDEPENDENT_AMBULATORY_CARE_PROVIDER_SITE_OTHER)

## 2023-08-23 ENCOUNTER — Encounter (HOSPITAL_BASED_OUTPATIENT_CLINIC_OR_DEPARTMENT_OTHER): Payer: Self-pay

## 2023-08-23 DIAGNOSIS — I10 Essential (primary) hypertension: Secondary | ICD-10-CM | POA: Diagnosis not present

## 2023-09-03 ENCOUNTER — Ambulatory Visit: Payer: Medicare PPO | Admitting: Podiatry

## 2023-09-05 DIAGNOSIS — K760 Fatty (change of) liver, not elsewhere classified: Secondary | ICD-10-CM | POA: Diagnosis not present

## 2023-09-05 DIAGNOSIS — E1165 Type 2 diabetes mellitus with hyperglycemia: Secondary | ICD-10-CM | POA: Diagnosis not present

## 2023-09-05 DIAGNOSIS — I10 Essential (primary) hypertension: Secondary | ICD-10-CM | POA: Diagnosis not present

## 2023-09-05 DIAGNOSIS — E662 Morbid (severe) obesity with alveolar hypoventilation: Secondary | ICD-10-CM | POA: Diagnosis not present

## 2023-09-05 DIAGNOSIS — E785 Hyperlipidemia, unspecified: Secondary | ICD-10-CM | POA: Diagnosis not present

## 2023-09-05 DIAGNOSIS — G4733 Obstructive sleep apnea (adult) (pediatric): Secondary | ICD-10-CM | POA: Diagnosis not present

## 2023-09-09 MED ORDER — DOXAZOSIN MESYLATE 4 MG PO TABS
4.0000 mg | ORAL_TABLET | Freq: Two times a day (BID) | ORAL | 3 refills | Status: DC
Start: 2023-09-09 — End: 2024-04-14

## 2023-09-09 MED ORDER — DOXAZOSIN MESYLATE 2 MG PO TABS: 2.0000 mg | ORAL_TABLET | Freq: Every day | ORAL | 3 refills | Status: DC

## 2023-09-09 NOTE — Addendum Note (Signed)
 Addended by: Guss Legacy on: 09/09/2023 07:29 AM   Modules accepted: Orders

## 2023-09-13 ENCOUNTER — Telehealth (HOSPITAL_BASED_OUTPATIENT_CLINIC_OR_DEPARTMENT_OTHER): Payer: Self-pay

## 2023-09-13 DIAGNOSIS — I10 Essential (primary) hypertension: Secondary | ICD-10-CM

## 2023-09-13 NOTE — Telephone Encounter (Signed)
Rx filled by other means

## 2023-09-16 DIAGNOSIS — M19012 Primary osteoarthritis, left shoulder: Secondary | ICD-10-CM | POA: Diagnosis not present

## 2023-09-16 DIAGNOSIS — M170 Bilateral primary osteoarthritis of knee: Secondary | ICD-10-CM | POA: Diagnosis not present

## 2023-09-23 ENCOUNTER — Telehealth: Payer: Self-pay | Admitting: Family

## 2023-09-23 DIAGNOSIS — M170 Bilateral primary osteoarthritis of knee: Secondary | ICD-10-CM | POA: Diagnosis not present

## 2023-09-23 MED ORDER — SPIRONOLACTONE 25 MG PO TABS: 25.0000 mg | ORAL_TABLET | Freq: Every day | ORAL | 30 refills | Status: AC

## 2023-09-23 MED ORDER — DOXAZOSIN MESYLATE 2 MG PO TABS: 2.0000 mg | ORAL_TABLET | Freq: Two times a day (BID) | ORAL | 3 refills | Status: AC

## 2023-09-23 NOTE — Telephone Encounter (Signed)
 Pt c/o medication issue:  1. Name of Medication: doxazosin  (CARDURA ) 2 MG tablet   2. How are you currently taking this medication (dosage and times per day)? -  3. Are you having a reaction (difficulty breathing--STAT)? -  4. What is your medication issue? [facility] calling requesting cb for clarification on script

## 2023-09-23 NOTE — Addendum Note (Signed)
 Addended by: Marci Setter B on: 09/23/2023 04:09 PM   Modules accepted: Orders

## 2023-09-23 NOTE — Telephone Encounter (Signed)
 Called and spoke to Tommy [name] from [facility] needing clarification on Doxazosin . Any and all questions were answered - pt is to take 2 mg and 4 mg making it a total of 6 mg twice a day.

## 2023-09-23 NOTE — Telephone Encounter (Signed)
 Please call pharmacy to ensure Doxazosin  available for him (Rx take 2mg  tablet and 4mg  tablet together daily for total of 6mg  dose).  Please send in Spironolactone  25mg  daily and ask Mr. [facility] to have updated BMET this week to monitor renal function and potassium.  Lamesha Tibbits S Nayelis Bonito, NP

## 2023-09-24 ENCOUNTER — Other Ambulatory Visit (HOSPITAL_COMMUNITY): Payer: Self-pay | Admitting: Internal Medicine

## 2023-09-24 DIAGNOSIS — I10 Essential (primary) hypertension: Secondary | ICD-10-CM | POA: Diagnosis not present

## 2023-09-24 DIAGNOSIS — Z5181 Encounter for therapeutic drug level monitoring: Secondary | ICD-10-CM | POA: Diagnosis not present

## 2023-09-24 LAB — BASIC METABOLIC PANEL WITH GFR
BUN/Creatinine Ratio: 20 (ref 10–24)
BUN: 17 mg/dL (ref 8–27)
CO2: 22 mmol/L (ref 20–29)
Calcium: 8.9 mg/dL (ref 8.6–10.2)
Chloride: 101 mmol/L (ref 96–106)
Creatinine, Ser: 0.86 mg/dL (ref 0.76–1.27)
Glucose: 150 mg/dL — ABNORMAL HIGH (ref 70–99)
Potassium: 4 mmol/L (ref 3.5–5.2)
Sodium: 141 mmol/L (ref 134–144)
eGFR: 89 mL/min/{1.73_m2} (ref >59)

## 2023-09-25 ENCOUNTER — Encounter (HOSPITAL_BASED_OUTPATIENT_CLINIC_OR_DEPARTMENT_OTHER): Payer: Self-pay

## 2023-09-25 NOTE — Telephone Encounter (Signed)
 Pt following up

## 2023-09-30 DIAGNOSIS — M170 Bilateral primary osteoarthritis of knee: Secondary | ICD-10-CM | POA: Diagnosis not present

## 2023-10-04 DIAGNOSIS — J45998 Other asthma: Secondary | ICD-10-CM | POA: Diagnosis not present

## 2023-10-04 DIAGNOSIS — G4733 Obstructive sleep apnea (adult) (pediatric): Secondary | ICD-10-CM | POA: Diagnosis not present

## 2023-10-10 DIAGNOSIS — M5416 Radiculopathy, lumbar region: Secondary | ICD-10-CM | POA: Diagnosis not present

## 2023-10-15 ENCOUNTER — Ambulatory Visit (HOSPITAL_BASED_OUTPATIENT_CLINIC_OR_DEPARTMENT_OTHER): Payer: Medicare PPO | Admitting: Family

## 2023-10-17 ENCOUNTER — Ambulatory Visit (HOSPITAL_BASED_OUTPATIENT_CLINIC_OR_DEPARTMENT_OTHER): Payer: Medicare PPO | Admitting: Family

## 2023-10-17 ENCOUNTER — Encounter (HOSPITAL_BASED_OUTPATIENT_CLINIC_OR_DEPARTMENT_OTHER): Payer: Self-pay | Admitting: Family

## 2023-10-17 VITALS — BP 119/71 | HR 70 | Ht 73.5 in | Wt 342.0 lb

## 2023-10-17 DIAGNOSIS — I4510 Unspecified right bundle-branch block: Secondary | ICD-10-CM

## 2023-10-17 DIAGNOSIS — I7121 Aneurysm of the ascending aorta, without rupture: Secondary | ICD-10-CM

## 2023-10-17 DIAGNOSIS — I5032 Chronic diastolic (congestive) heart failure: Secondary | ICD-10-CM | POA: Diagnosis not present

## 2023-10-17 DIAGNOSIS — I10 Essential (primary) hypertension: Secondary | ICD-10-CM | POA: Diagnosis not present

## 2023-10-17 DIAGNOSIS — E785 Hyperlipidemia, unspecified: Secondary | ICD-10-CM | POA: Diagnosis not present

## 2023-10-17 DIAGNOSIS — G4733 Obstructive sleep apnea (adult) (pediatric): Secondary | ICD-10-CM | POA: Diagnosis not present

## 2023-10-17 DIAGNOSIS — I452 Bifascicular block: Secondary | ICD-10-CM

## 2023-10-17 DIAGNOSIS — I700 Atherosclerosis of aorta: Secondary | ICD-10-CM | POA: Diagnosis not present

## 2023-10-17 NOTE — Patient Instructions (Signed)
 Medication Instructions:  Continue your current medications  *If you need a refill on your cardiac medications before your next appointment, please call your pharmacy*  Follow-Up: At [facility], you and your health needs are our priority.  As part of our continuing mission to provide you with exceptional heart care, our providers are all part of one team.  This team includes your primary Cardiologist (physician) and Advanced Practice Providers or APPs (Physician Assistants and Nurse Practitioners) who all work together to provide you with the care you need, when you need it.  Your next appointment:   1 year(s)  Provider:   Maudine Sos, MD, Slater Duncan, NP, or Neomi Banks, NP    We recommend signing up for the patient portal called "MyChart".  Sign up information is provided on this After Visit Summary.  MyChart is used to connect with patients for Virtual Visits (Telemedicine).  Patients are able to view lab/test results, encounter notes, upcoming appointments, etc.  Non-urgent messages can be sent to your provider as well.   To learn more about what you can do with MyChart, go to [email or web address].   Other Instructions      Exercises to do While Sitting Warm-up Before starting other exercises: Sit up as straight as you can. Have your knees bent at 90 degrees, which is the shape of the capital letter "L." Keep your feet flat on the floor. Sit at the front edge of your chair, if you can. Pull in (tighten) the muscles in your abdomen and stretch your spine and neck as straight as you can. Hold this position for a few minutes. Breathe in and out evenly. Try to concentrate on your breathing, and relax your mind.  Stretching Exercise A: Arm stretch Hold your arms out straight in front of your body. Bend your hands at the wrist with your fingers pointing up, as if signaling someone to stop. Notice the slight tension in your forearms as you hold the  position. Keeping your arms out and your hands bent, rotate your hands outward as far as you can and hold this stretch. Aim to have your thumbs pointing up and your pinkie fingers pointing down. Slowly repeat arm stretches for one minute as tolerated. Exercise B: Leg stretch If you can move your legs, try to "draw" letters on the floor with the toes of your foot. Write your name with one foot. Write your name with the toes of your other foot. Slowly repeat the movements for one minute as tolerated. Exercise C: Reach for the sky Reach your hands as far over your head as you can to stretch your spine. Move your hands and arms as if you are climbing a rope. Slowly repeat the movements for one minute as tolerated.  Range of motion exercises Exercise A: Shoulder roll Let your arms hang loosely at your sides. Lift just your shoulders up toward your ears, then let them relax back down. When your shoulders feel loose, rotate your shoulders in backward and forward circles. Do shoulder rolls slowly for one minute as tolerated. Exercise B: March in place As if you are marching, pump your arms and lift your legs up and down. Lift your knees as high as you can. If you are unable to lift your knees, just pump your arms and move your ankles and feet up and down. March in place for one minute as tolerated. Exercise C: Seated jumping jacks Let your arms hang down straight. Keeping your arms straight, lift them  up over your head. Aim to point your fingers to the ceiling. While you lift your arms, straighten your legs and slide your heels along the floor to your sides, as wide as you can. As you bring your arms back down to your sides, slide your legs back together. If you are unable to use your legs, just move your arms. Slowly repeat seated jumping jacks for one minute as tolerated.  Strengthening exercises Exercise A: Shoulder squeeze Hold your arms straight out from your body to your sides, with  your elbows bent and your fists pointed at the ceiling. Keeping your arms in the bent position, move them forward so your elbows and forearms meet in front of your face. Open your arms back out as wide as you can with your elbows still bent, until you feel your shoulder blades squeezing together. Hold for 5 seconds. Slowly repeat the movements forward and backward for one minute as tolerated.

## 2023-10-17 NOTE — Progress Notes (Signed)
 Cardiology Office Note:  .   Date:  10/17/2023  ID:  Estil Heman, DOB January 08, 1946, MRN [id number] PCP: Margarete Sharps, MD  [facility] Providers Cardiologist:  Maudine Sos, MD    History of Present Illness: .   Tommy [name] is a 78 y.o. male with history of asthma, HTN, GERD, fatty liver disease, OSA on CPAP, HFpEF, ascending aortic aneurysm, morbid obesity, aortic atherosclerosis.   Prior workup myoview  10/2015 negative for ischemia, but Ef reported 30-44%. Subsequent echo normal LVEf and mild LVH. Diastolic function abnormal but degree of dysfunction not reported.   Doxazosin  previously increased to 6mg  BID. Echo 08/2020 LVEF 60-65%, gr1dd, ascending aorta 4.5 unchanged from 2017.   Seen 04/2022 with successful 30 lbs weight loss on Mounjaro . Due to LE edema, repeat echo 04/2022 LVEF 55-60%, mild LVH, gr1DD, trivial AI/MR, ascending aortic aneurysm 49 mm, aneurysm of aortic root 45 mm, ER aneurysm of the aortic arch 46 mm.  CTA chest/aorta 06/2022 revealed ascending thoracic aneurysm 5.9 cm, indeterminate 16mm left adrenal nodule probable for adrenal adenoma, small hiatal hernia.  He was referred to cardiothoracic surgery.  At visit 10/23/22 Doxazosin  increased to 8 mg twice daily for BP control.  Seen by cardiothoracic surgery 01/14/2023 with CT images aortic root 4.5 cm, ascending aorta 4.5 cm, ascending aorta 4.7 cm.  Will meet criteria for surgery only if measurements are placed 5.5 cm diameter.  Was recommended for continued BP control and repeat CTA in 1 year.  At visit 07/29/23 Doxazosin  reduced to 6mg  BID (he felt BP escalated on 8mg  dose). Metanephrines unremarkable, normal renin-aldosterone ratio, normal thyroid . Renal duplex 08/23/23 with no renal artery stenosis though noted limited imaging due to bowel gas.  Presents today for follow up. Doing well since last seen with no shortness of breath nor dyspnea on exertion. Reports no chest pain, pressure, or tightness.  No edema, orthopnea, PND. Reports no palpitations.  BP at home with readings such as 112/72, 116/72. Will have occasional lightheadedness upon first waking prior to medications. Likely orthostatic component. No near syncope, syncope. Notes physical activity limited by knee pain radiating to his back. Recently had injection, hopeful for improvement. Plans to start exercising by walking in his pool when opens for the season.  ROS: Please see the history of present illness.    All other systems reviewed and are negative.   Studies Reviewed: .           Risk Assessment/Calculations:             Physical Exam:   VS:  BP 119/71   Pulse 70   Ht 6' 1.5" (1.867 m)   Wt (!) 342 lb (155.1 kg)   SpO2 95%   BMI 44.51 kg/m    Wt Readings from Last 3 Encounters:  10/17/23 (!) 342 lb (155.1 kg)  07/29/23 (!) 339 lb 3.2 oz (153.9 kg)  06/03/23 (!) 335 lb 12.8 oz (152.3 kg)    Vitals:   10/17/23 0814  BP: 119/71  Pulse: 70  Height: 6' 1.5" (1.867 m)  Weight: (!) 342 lb (155.1 kg)  SpO2: 95%  BMI (Calculated): 44.5     GEN: Well nourished, overweight, well developed in no acute distress NECK: No JVD; No carotid bruits CARDIAC: RRR, no murmurs, rubs, gallops RESPIRATORY:  Clear to auscultation without rales, wheezing or rhonchi  ABDOMEN: Soft, non-tender, non-distended EXTREMITIES:  No edema; No deformity   ASSESSMENT AND PLAN: .    Ascending aortic aneurysm- Follows  with [facility]. Last seen 12/2022 recommended for CT in 1 year and optimal BP control. Continue Nebivolol  20mg  daily. Encouraged to schedule August follow up with their team.   HFpEF/HTN- BP at goal <130/80. Euvolemic and well compensated on exam. Continue Doxazosin  6mg  BID, spironolactone  25 mg daily, valsartan -HCTZ 320-25 mg daily, Amlodipine  10mg  daily.  Reports rarely using PRN Lasix.  Prior renal artery duplex, plasma metanephrines, renin-aldosterone, TSH unremarkable If develops persistent lightheadedness, consider reducing  Doxazosin  dose.   HLD /aortic atherosclerosis- 01/2023 LDL 34. Continue Rosuvastatin 5 mg daily.   Morbid obesity- Weight loss via diet and exercise encouraged. Discussed the impact being overweight would have on cardiovascular risk. Plans to start walking in his pool when it opens for the season. Handout provided on seated exercises.   OSA- CPAP compliance encouraged. Endorses wearing regularly.    Bifascicular block-Continue to monitor with periodic EKG. No near syncope, syncope.        Dispo: follow up in 1 year  Signed, Clearnce Curia, NP

## 2023-10-22 DIAGNOSIS — E662 Morbid (severe) obesity with alveolar hypoventilation: Secondary | ICD-10-CM | POA: Diagnosis not present

## 2023-10-22 DIAGNOSIS — I872 Venous insufficiency (chronic) (peripheral): Secondary | ICD-10-CM | POA: Diagnosis not present

## 2023-10-22 DIAGNOSIS — G629 Polyneuropathy, unspecified: Secondary | ICD-10-CM | POA: Diagnosis not present

## 2023-10-22 DIAGNOSIS — I10 Essential (primary) hypertension: Secondary | ICD-10-CM | POA: Diagnosis not present

## 2023-10-22 DIAGNOSIS — M549 Dorsalgia, unspecified: Secondary | ICD-10-CM | POA: Diagnosis not present

## 2023-10-22 DIAGNOSIS — E1140 Type 2 diabetes mellitus with diabetic neuropathy, unspecified: Secondary | ICD-10-CM | POA: Diagnosis not present

## 2023-10-22 DIAGNOSIS — I5189 Other ill-defined heart diseases: Secondary | ICD-10-CM | POA: Diagnosis not present

## 2023-10-22 DIAGNOSIS — K760 Fatty (change of) liver, not elsewhere classified: Secondary | ICD-10-CM | POA: Diagnosis not present

## 2023-10-22 DIAGNOSIS — I712 Thoracic aortic aneurysm, without rupture, unspecified: Secondary | ICD-10-CM | POA: Diagnosis not present

## 2023-12-03 ENCOUNTER — Other Ambulatory Visit: Payer: Self-pay | Admitting: Surgery

## 2023-12-03 DIAGNOSIS — I7121 Aneurysm of the ascending aorta, without rupture: Secondary | ICD-10-CM

## 2023-12-20 DIAGNOSIS — M19012 Primary osteoarthritis, left shoulder: Secondary | ICD-10-CM | POA: Diagnosis not present

## 2023-12-23 ENCOUNTER — Telehealth: Payer: Self-pay

## 2023-12-23 ENCOUNTER — Telehealth: Payer: Self-pay | Admitting: *Deleted

## 2023-12-23 NOTE — Telephone Encounter (Signed)
 Patient is returning call.

## 2023-12-23 NOTE — Telephone Encounter (Signed)
1st attempt to reach pt regarding surgical clearance and the need for a tele visit.  Left a message for pt to call back and ask for the preop team. 

## 2023-12-23 NOTE — Telephone Encounter (Signed)
   Pre-operative Risk Assessment    Patient Name: Tommy [name]  DOB: 22-Sep-1945 MRN: [id number]   Date of last office visit: Reche Finder, NP 10/17/2023 Date of next office visit: NONE   Request for Surgical Clearance    Procedure:  Left reverse total shoulder arthroplasty   Date of Surgery:  Clearance TBD                                Surgeon: Dr. Franky Pointer Surgeon's Group or Practice Name: [facility] Phone number: [phone] Fax number: [phone]   Type of Clearance Requested:   - Medical  - Pharmacy:  Hold Aspirin     Type of Anesthesia:  Not Indicated   Additional requests/questions:    Bonney Asberry KANDICE Ethelene   12/23/2023, 2:15 PM

## 2023-12-23 NOTE — Telephone Encounter (Signed)
   Name: Tommy [name]  DOB: April 11, 1946  MRN: [id number]  Primary Cardiologist: Annabella Scarce, MD   Preoperative team, please contact this patient and set up a phone call appointment for further preoperative risk assessment. Please obtain consent and complete medication review. Thank you for your help.  I confirm that guidance regarding antiplatelet and oral anticoagulation therapy has been completed and, if necessary, noted below.  Regarding ASA therapy, we recommend continuation of ASA throughout the perioperative period. However, if the surgeon feels that cessation of ASA is required in the perioperative period, it may be stopped 5-7 days prior to surgery with a plan to resume it as soon as felt to be feasible from a surgical standpoint in the post-operative period.    I also confirmed the patient resides in the state of [place] . As per [facility] Medical Board telemedicine laws, the patient must reside in the state in which the provider is licensed.   Tommy CHRISTELLA Beauvais, NP 12/23/2023, 3:04 PM [facility]

## 2023-12-23 NOTE — Telephone Encounter (Signed)
 Pt has been scheduled for a tele visit, 12/25/23 3:20.  Consent on file / medications reconciled.

## 2023-12-23 NOTE — Telephone Encounter (Signed)
 Pt has been scheduled for a tele visit, 12/25/23 3:20.  Consent on file / medications reconciled.    Patient Consent for Virtual Visit        Tommy [name] has provided verbal consent on 12/23/2023 for a virtual visit (video or telephone).   CONSENT FOR VIRTUAL VISIT FOR:  Tommy [name]  By participating in this virtual visit I agree to the following:  I hereby voluntarily request, consent and authorize [facility] and its employed or contracted physicians, physician assistants, nurse practitioners or other licensed health care professionals (the Practitioner), to provide me with telemedicine health care services (the "Services) as deemed necessary by the treating Practitioner. I acknowledge and consent to receive the Services by the Practitioner via telemedicine. I understand that the telemedicine visit will involve communicating with the Practitioner through live audiovisual communication technology and the disclosure of certain medical information by electronic transmission. I acknowledge that I have been given the opportunity to request an in-person assessment or other available alternative prior to the telemedicine visit and am voluntarily participating in the telemedicine visit.  I understand that I have the right to withhold or withdraw my consent to the use of telemedicine in the course of my care at any time, without affecting my right to future care or treatment, and that the Practitioner or I may terminate the telemedicine visit at any time. I understand that I have the right to inspect all information obtained and/or recorded in the course of the telemedicine visit and may receive copies of available information for a reasonable fee.  I understand that some of the potential risks of receiving the Services via telemedicine include:  Delay or interruption in medical evaluation due to technological equipment failure or disruption; Information transmitted may not be  sufficient (e.g. poor resolution of images) to allow for appropriate medical decision making by the Practitioner; and/or  In rare instances, security protocols could fail, causing a breach of personal health information.  Furthermore, I acknowledge that it is my responsibility to provide information about my medical history, conditions and care that is complete and accurate to the best of my ability. I acknowledge that Practitioner's advice, recommendations, and/or decision may be based on factors not within their control, such as incomplete or inaccurate data provided by me or distortions of diagnostic images or specimens that may result from electronic transmissions. I understand that the practice of medicine is not an exact science and that Practitioner makes no warranties or guarantees regarding treatment outcomes. I acknowledge that a copy of this consent can be made available to me via my patient portal [facility] MyChart), or I can request a printed copy by calling the office of [facility].    I understand that my insurance will be billed for this visit.   I have read or had this consent read to me. I understand the contents of this consent, which adequately explains the benefits and risks of the Services being provided via telemedicine.  I have been provided ample opportunity to ask questions regarding this consent and the Services and have had my questions answered to my satisfaction. I give my informed consent for the services to be provided through the use of telemedicine in my medical care

## 2023-12-25 ENCOUNTER — Ambulatory Visit: Attending: Cardiology

## 2023-12-25 DIAGNOSIS — Z01810 Encounter for preprocedural cardiovascular examination: Secondary | ICD-10-CM | POA: Diagnosis not present

## 2023-12-25 NOTE — Progress Notes (Signed)
 Virtual Visit via Telephone Note   Because of Tommy [name] co-morbid illnesses, he is at least at moderate risk for complications without adequate follow up.  This format is felt to be most appropriate for this patient at this time.  Due to technical limitations with video connection (technology), today's appointment will be conducted as an audio only telehealth visit, and Tommy [name] verbally agreed to proceed in this manner.   All issues noted in this document were discussed and addressed.  No physical exam could be performed with this format.  Evaluation Performed:  Preoperative cardiovascular risk assessment _____________   Date:  12/25/2023   Patient ID:  Tommy [name], DOB [birth year], MRN [id number] Patient Location:  Home Provider location:   Office  Primary Care Provider:  Onita Rush, MD Primary Cardiologist:  Annabella Scarce, MD  Chief Complaint / Patient Profile   78 y.o. y/o male with a h/o asthma, HTN, GERD, fatty liver disease, OSA on CPAP, HFpEF, ascending aortic aneurysm, morbid obesity, and aortic atherosclerosis who is pending left reverse total shoulder arthroplasty and presents today for telephonic preoperative cardiovascular risk assessment.  History of Present Illness    Tommy [name] is a 78 y.o. male who presents via audio/video conferencing for a telehealth visit today.  Pt was last seen in cardiology clinic on 10/17/2023 by Reche Finder, NP.  At that time Tommy [name] was doing well.  The patient is now pending procedure as outlined above. Since his last visit, he tells me he has been doing okay from a heart standpoint.  No chest pain or shortness of breath.  He is able to surpass 4 METS on the DASI.  He is somewhat limited due to back pain, knee pain, and he also needs a right shoulder replacement eventually.   Regarding ASA therapy, we recommend continuation of ASA throughout the perioperative period. However, if the  surgeon feels that cessation of ASA is required in the perioperative period, it may be stopped 5-7 days prior to surgery with a plan to resume it as soon as felt to be feasible from a surgical standpoint in the post-operative period.   Past Medical History    Past Medical History:  Diagnosis Date   Allergy    Anemia    as teenager   Arthritis    hands, hip   Ascending aortic aneurysm (HCC) 04/23/2022   Back pain    BPPV (benign paroxysmal positional vertigo)    History of   Cancer (HCC)    melanoma removed from leg years ago, malignant mole   Cataract    Chronic back pain    Chronic diastolic heart failure (HCC) 04/23/2022   Complication of anesthesia    woke up and was confused and combative after back surgery   Constipation    DDD (degenerative disc disease), lumbar    Diabetes mellitus without complication (HCC)    Diabetic neuropathy (HCC)    ED (erectile dysfunction)    Essential hypertension 10/16/2015   Fatty liver    First degree AV block    EKG 12/03/16   GERD (gastroesophageal reflux disease)    diet controlled, no meds currently, history of   H/O hiatal hernia    History of elevated PSA    Nocturia more than twice per night 12/21/2013   OA (osteoarthritis)    Obesity hypoventilation syndrome (HCC) 04/07/2014   Obesity, morbid (HCC) 12/21/2013   OSA on CPAP 04/07/2014   Pure hypercholesterolemia 04/18/2021  RBBB    previous EKG   Sleep apnea    uses CPAP nightly   Snoring 12/21/2013   Spondylosis 2014   lumbar back, noted on MR    Ulcer    stomach ulcer as teenager   Past Surgical History:  Procedure Laterality Date   BACK SURGERY     done approx. 24 years ago   CARDIOVASCULAR STRESS TEST     done approx 10 years ago, Dr. Onita   COLONOSCOPY  2006   hx of with removal of polyps/[facility]   COLONOSCOPY WITH PROPOFOL  N/A 09/06/2015   Procedure: COLONOSCOPY WITH PROPOFOL ;  Surgeon: Gwendlyn ONEIDA Buddy, MD;  Location: [facility];  Service:  Endoscopy;  Laterality: N/A;   lipoma removed     from leg   LUMBAR LAMINECTOMY/DECOMPRESSION MICRODISCECTOMY  06/19/2012   Procedure: LUMBAR LAMINECTOMY/DECOMPRESSION MICRODISCECTOMY 1 LEVEL;  Surgeon: Donaciano Sprang, MD;  Location: [facility];  Service: Orthopedics;  Laterality: Left;  L2-3 LEFT MICRODISCECTOMY   thigh surgery     fatty deposit   TONSILLECTOMY     TOTAL HIP ARTHROPLASTY Right 06/05/2017   Procedure: RIGHT TOTAL HIP ARTHROPLASTY ANTERIOR APPROACH;  Surgeon: Melodi Lerner, MD;  Location: [facility];  Service: Orthopedics;  Laterality: Right;  Dr. requested 120 minutes   WISDOM TOOTH EXTRACTION     x 1 tooth    Allergies  Allergies  Allergen Reactions   Linaclotide Other (See Comments)   Micardis [Telmisartan] Hives and Rash    Home Medications    Prior to Admission medications   Medication Sig Start Date End Date Taking? Authorizing Provider  ACCU-CHEK AVIVA PLUS test strip  01/20/20   [provider]  Accu-Chek Softclix Lancets lancets  01/20/20   [provider]  amLODipine  (NORVASC ) 10 MG tablet Take 10 mg by mouth every morning.     [provider]  aspirin EC 81 MG tablet Take 81 mg by mouth daily.    [provider]  cyclobenzaprine (FLEXERIL) 5 MG tablet Take 5 mg by mouth as needed for muscle spasms.    [provider]  doxazosin  (CARDURA ) 2 MG tablet Take 1 tablet (2 mg total) by mouth 2 (two) times daily. Take with 4 mg tablet to total 6 mg twice a day 09/23/23   Walker, Caitlin S, NP  doxazosin  (CARDURA ) 4 MG tablet Take 1 tablet (4 mg total) by mouth 2 (two) times daily. Take one tablet with your 2mg  tablet to make 6mg  09/09/23   Walker, Caitlin S, NP  FIBER PO Take 2 tablets by mouth every morning.    [provider]  furosemide (LASIX) 40 MG tablet Take 40 mg by mouth as needed for fluid or edema. Prn    [provider]  gabapentin (NEURONTIN) 600 MG tablet Take 600 mg by mouth 3 (three) times daily. Take  600mg  am and 900mg  pm    [provider]  Glucosamine-Chondroit-Vit C-Mn (GLUCOSAMINE 1500 COMPLEX PO) Take by mouth.    [provider]  INVOKANA  100 MG TABS tablet  06/02/18   [provider]  ipratropium (ATROVENT ) 0.03 % nasal spray Place 1 spray into both nostrils at bedtime as needed for rhinitis.  09/19/13   [provider]  mometasone (NASONEX) 50 MCG/ACT nasal spray Place 2 sprays into the nose daily.    [provider]  montelukast  (SINGULAIR ) 10 MG tablet Take 10 mg by mouth every morning.    [provider]  MOUNJARO  15 MG/0.5ML Pen Inject  15 mg into the skin once a week.    [provider]  Multiple Vitamin (MULTIVITAMIN) capsule Take 1 capsule by mouth daily.    [provider]  naproxen (NAPROSYN) 500 MG tablet Take 500 mg by mouth 2 (two) times daily with a meal.    [provider]  Nebivolol  HCl 20 MG TABS TAKE 1 TABLET (20 MG TOTAL) BY MOUTH DAILY. 07/03/23   Raford Riggs, MD  rosuvastatin (CRESTOR) 5 MG tablet rosuvastatin 5 mg tablet    [provider]  spironolactone  (ALDACTONE ) 25 MG tablet Take 1 tablet (25 mg total) by mouth daily. 09/23/23 12/23/23  Walker, Caitlin S, NP  valsartan -hydrochlorothiazide  (DIOVAN -HCT) 320-25 MG per tablet Take 1 tablet by mouth every morning.    [provider]    Physical Exam    Vital Signs:  Tommy [name] does not have vital signs available for review today.  Given telephonic nature of communication, physical exam is limited. AAOx3. NAD. Normal affect.  Speech and respirations are unlabored.  Accessory Clinical Findings    None  Assessment & Plan    1.  Preoperative Cardiovascular Risk Assessment:   Mr. Gwaltney perioperative risk of a major cardiac event is 0.4% according to the Revised Cardiac Risk Index (RCRI).  Therefore, he is at low risk for perioperative complications.   His functional capacity is good at 5.62 METs  according to the [place] Activity Status Index (DASI). Recommendations: According to ACC/AHA guidelines, no further cardiovascular testing needed.  The patient may proceed to surgery at acceptable risk.   Antiplatelet and/or Anticoagulation Recommendations: Aspirin can be held for 5-7 days prior to his surgery.  Please resume Aspirin post operatively when it is felt to be safe from a bleeding standpoint.   The patient was advised that if he develops new symptoms prior to surgery to contact our office to arrange for a follow-up visit, and he verbalized understanding.   A copy of this note will be routed to requesting surgeon.  Time:   Today, I have spent 7 minutes with the patient with telehealth technology discussing medical history, symptoms, and management plan.     Orren LOISE Fabry, PA-C  12/25/2023, 2:56 PM

## 2023-12-31 NOTE — Progress Notes (Signed)
 SABRA

## 2024-01-01 ENCOUNTER — Ambulatory Visit: Payer: Medicare PPO | Admitting: Family Medicine

## 2024-01-01 ENCOUNTER — Encounter: Payer: Self-pay | Admitting: Family Medicine

## 2024-01-01 VITALS — BP 115/78 | HR 70 | Ht 73.5 in | Wt 338.0 lb

## 2024-01-01 DIAGNOSIS — G4733 Obstructive sleep apnea (adult) (pediatric): Secondary | ICD-10-CM

## 2024-01-01 NOTE — Patient Instructions (Addendum)
 Please continue using your CPAP regularly. While your insurance requires that you use CPAP at least 4 hours each night on 70% of the nights, I recommend, that you not skip any nights and use it throughout the night if you can. Getting used to CPAP and staying with the treatment long term does take time and patience and discipline. Untreated obstructive sleep apnea when it is moderate to severe can have an adverse impact on cardiovascular health and raise her risk for heart disease, arrhythmias, hypertension, congestive heart failure, stroke and diabetes. Untreated obstructive sleep apnea causes sleep disruption, nonrestorative sleep, and sleep deprivation. This can have an impact on your day to day functioning and cause daytime sleepiness and impairment of cognitive function, memory loss, mood disturbance, and problems focussing. Using CPAP regularly can improve these symptoms.  We will update supply orders, today. Consider melatonin over the counter as needed for sleep aid. Keep an eye on your mask seal. Consider talking with [facility] is mask leak continues.   Follow up in 1 year

## 2024-01-01 NOTE — Progress Notes (Signed)
 PATIENT: Tommy [name] DOB: [birth year]  REASON FOR VISIT: follow up HISTORY FROM: patient  Chief Complaint  Patient presents with   Follow-up    [name]. Alone. Here for cpap follow up. Pt scheduled for left shoulder replacement in Oct. Pt said sleep has been hard due to left shoulder pain. Otherwise doing okay.     HISTORY OF PRESENT ILLNESS:  12/26/2021 ALL: Tommy [name] returns for follow up for OSA on CPAP. He continues to do well on therapy. He is using CPAP nightly for about 6 hours, on average. He does reports more interrupted sleep, recently, due to shoulder pain. He is planning replacement 02/2024. He has noticed more of a leak. Using nasal pillow. He reports headgear is different than in the past. He is not overly bothered by leak. Could be related to tossing and turing all night.     12/31/2022 ALL: Tommy [name] returns for follow up for OSA on CPAP. He continues to do very well on therapy. He is using CPAP nightly for about 6-7 hours, on average. No trouble with machine or supplies. He does have an extra water  chamber to use if needed during the night. He continues to have disrupted sleep due to bilateral knee pain. Also having some shoulder pain. His wife had back surgery at the beginning of the year and required inpatient rehab following. Unfortunately, she recently had a fall and needs TKR. Tommy [name] has postponed his surgery until she is better. He continues close follow up with PCP, cardiology and ortho.     12/26/2021 ALL: Tommy Acklin returns for follow up for OSA on CPAP. He reports doing very well on therapy. He is having a harder time sleeping due to bilateral knee and back pain. He tosses and turns all night. He may sleep 6 hours a night. He is preparing for knee replacement. He is getting nerve blocks and knee injections that have helped a little. He has lost 36lbs. He is on Mounjaro  and planning to increase dose soon.   Compliance report over the past 30  days shows excellent compliance. 100% with daily and 4 hour usage. Residual AHI 0.9/hr. Leak is slightly elevated at 21l/min.   12/21/2020 ALL: Tommy Bloodworth returns for follow up for OSA on CPAP. He received new CPAP machine 09/2020. He is doing well. He feels that he tolerates new machine much better. He is now using [employer] for DME. He does have some congestion in the mornings. Using Nasonex and Atrovent  nasal spray.     09/23/2019 ALL: Tommy [name] is a 78 y.o. male here today for follow up for OSA on CPAP.  He is doing very well today.  He continues CPAP therapy nightly and for greater than 4 hours each night.  He reports he cannot sleep without using CPAP.  He has noted that his machine does not power on at times.  He has to press the power button several times before it will start.  His machine is approximately 78 years old.  He would like to consider getting a new CPAP machine.  Otherwise he is doing very well and has no concerns.  Compliance report dated 08/23/2019 through 09/21/2019 reveals that he is used CPAP therapy every night for compliance of 100%.  He has used CPAP 4 hours every night.  Average usage was 6 hours and 33 minutes.  Residual AHI was 3.0 on 10 to 16 cm of water  and an EPR of 3.  There was  no significant leak noted.  HISTORY: (copied from my note on 10/12/2018)  Tommy [name] is a 78 y.o. male for follow up for OSA on CPAP. He continues to do well with therapy and has no concerns today.    09/15/2018 - 10/14/2018  - 10/14/2018 Usage days 30/30 days (100%) >= 4 hours 30 days (100%) < 4 hours 0 days (0%) Usage hours 203 hours 26 minutes Average usage (total days) 6 hours 47 minutes Average usage (days used) 6 hours 47 minutes Median usage (days used) 6 hours 48 minutes Total used hours (value since last reset - 10/14/2018) 11,335 hours AirSense 10 AutoSet Serial number [id number] Mode AutoSet Min Pressure 10 cmH2O Max Pressure 16 cmH2O EPR Fulltime EPR  level 3 Therapy Pressure - cmH2O Median: 11.0 95th percentile: 12.5 Maximum: 13.6 Leaks - L/min Median: 3.4 95th percentile: 12.1 Maximum: 16.4 Events per hour AI: 2.2 HI: 0.6 AHI: 2.8 Apnea Index Central: 0.8 Obstructive: 1.3 Unknown: 0.0 RERA Index 0.0 Cheyne-Stokes respiration (average duration per night) 10 minutes (3%)   History (copied from Dr Dohmeier's note on 10/10/2017)    Interval history from 10 Oct 2017, I have the pleasure of meeting with Tommy. Breyer [name]. [name] today who underwent a replacement surgery under the guidance of Dr. Mervyn.  His right hip is now healed recovered and he is fairly mobile.  He reports that in the last 6 weeks he has done more project in the whole year before.  In order to get ready for surgery and limit his surgical risk he was able to lose weight but he has already gained back 20 pounds.  But it was previously hip pain that interrupted and fragmented his sleep it is now nocturia.  He states that he has no longer blocks of 4 interrupted interrupted hours of sleep but only about 2.   He has been as in the years before 100% compliant with CPAP he is using an AutoSet between 10 and 16 cmH2O with 3 cm EPR and this window covers his current pressure needs.  His pressure at the 95th percentile is 11.9 cmH2O so well was in the window.  Residual apnea and hypercapnia index AHI was 1.5/h and they were 3 minutes or 1% of the night of Cheyne-Stokes respirations. He takes a muscle relaxant at night.  DME is [facility]-mask type nasal pillow, air fit P 10  Epworth 5 points, not bad. FSS at 35, geriatric depression score is 2/15. He sleeps so much better with CPAP, noted the difference when his home lost power.    Fam history: Brother has now OSA with CPAP.  Patient reports vivid dreams in AM - not enacting them.    Interval history from 10/10/2016 area at the pleasure of seeing Tommy. Tommy [name] today, who is diabetic, hypertensive and was diagnosed with obstructive sleep apnea  in 2015. His diabetes is currently treated with invokana - this causes some nocturia. He is compliant with his CPAP use, uses an AutoSet between 10 and 16 cm water  with 3 cm EPR achieving a residual AHI of 1.2 apneas per hour. His 100% compliant by days and hours of use was an average user time of 6 hours and 50 minutes. He is using an air sense machine by [employer] and nasal pillows. No adjustments have to be made on this side. He still uses CPAP without ramp function and with a high starting pressure. I will repeat an ONO to see if he still needs oxygen . He likes to travel  and oxygen  need complicates things. He has hip pain, awaiting a consultation with Dr. Hiram.   His wife is on CPAP, too.    HISTORY OF PRESENT ILLNESS: EDRAS WILFORD is a 78 y.o.caucasian , right handed  male , who is seen here as a referral from Dr. Onita for sleep evaluation.  The patient  Is a retired [employer] manager, Editor, commissioning. He has sleep problems of nocturia, snoring and GERD in sleep, EDS . He also has a history of osteoarthritis allergic rhinitis, GERD, hypertension, fatty liver disease. He has a family history of heart disease or hypertension. Tommy. Mcconathy reports that she had back surgery ( laminectomy in  level L4-L5  ) and had reported to his orthopedists that he was excessively daytime sleepy and had trouble staying awake when driving longer distances. Dr. Burnetta asked him to make his primary care physician aware of this problem.   Prior to his back surgery in 2014 the patient was used to sleep in a recliner mainly because he could not find a comfortable position in bed. Since his back surgery this has improved tremendously and he is now sleeping in a regular bed. He has aches and pain when exercising, gained weight even after back surgery, he gained 60 pounds before surgery in only 3 years. He is frequently short of breath when just speaking, not exercising.He has a sister with heart disease, a brother with  obstructive sleep apnea ,  mother but was diagnosed as obstructive sleep apnea and his father is deceased with congestive heart failure.    Interval history 04-07-14 CD Tommy. Berti underwent a split night polysomnography on 02-01-14 after endorsing the Epworth Sleepiness Scale at 16 points and presenting with a neck circumference of 20 inches and a BMI of 48.5. The patient's AHI was 43.1 he did not have any REM sleep, he did not sleep supine for these 2 hours of diagnostic study - his CO2 increased to 54.44. His oxygen  nadir was 84% for 43.1 minutes the patient was titrated and interestingly his oxygen  saturations actually decreased further 164 minutes of desaturations were found during the CPAP titration with a nadir at 71% [name]. And he had nocturia several times at night. He was fitted with an O2 pressure window between 10 and 16 cm water . We also recommended possible oxygen  therapy should his hypoxemia continue on CPAP. Today is the patient's first visit after sleep study and he has noted some remarkable differences for example his Epworth Sleepiness Scale is now 10 and his fatigue severity score 41 points. The first 30 days of use he struggled with his CPAP and mostly slept in a recliner. He now has adjusted quite well at 30 day download dated 04-05-16 chills 5 hours and 33 minutes of daily use 100% compliance of full-time EPR level of 3 cm water  and a pressure of 12.9 cm water  at the 91st percentile. His residual AHI is 1.3. There were no Cheyne-Stokes respirations noted. An adjustment of settings is not necessary but the patient wishes to not have a ramp function that. He has likely to use some additional oxygen . ONO showed on CPAP low oxygenation.  He has taken off the RAMP function and reduced the humidity.   He has not fallen asleep in church since being on CPAP.    REVIEW OF SYSTEMS: Out of a complete 14 system review of symptoms, the patient complains  only of the following symptoms, morning congestion, chronic  hip pain, and all other reviewed systems are negative.  ESS: 7/24, previously 9/24  ALLERGIES: Allergies  Allergen Reactions   Linaclotide Other (See Comments)   Micardis [Telmisartan] Hives and Rash    HOME MEDICATIONS: Outpatient Medications Prior to Visit  Medication Sig Dispense Refill   ACCU-CHEK AVIVA PLUS test strip      Accu-Chek Softclix Lancets lancets      amLODipine  (NORVASC ) 10 MG tablet Take 10 mg by mouth every morning.      aspirin EC 81 MG tablet Take 81 mg by mouth daily.     cyclobenzaprine (FLEXERIL) 5 MG tablet Take 5 mg by mouth as needed for muscle spasms.     doxazosin  (CARDURA ) 2 MG tablet Take 1 tablet (2 mg total) by mouth 2 (two) times daily. Take with 4 mg tablet to total 6 mg twice a day 180 tablet 3   doxazosin  (CARDURA ) 4 MG tablet Take 1 tablet (4 mg total) by mouth 2 (two) times daily. Take one tablet with your 2mg  tablet to make 6mg  90 tablet 3   FIBER PO Take 2 tablets by mouth every morning.     furosemide (LASIX) 40 MG tablet Take 40 mg by mouth as needed for fluid or edema. Prn     gabapentin (NEURONTIN) 600 MG tablet Take 600 mg by mouth 3 (three) times daily. Take 600mg  am and 900mg  pm (Patient taking differently: Take 600 mg by mouth 3 (three) times daily. Takes 100 mg in morning and 100 mg in evening)     Glucosamine-Chondroit-Vit C-Mn (GLUCOSAMINE 1500 COMPLEX PO) Take by mouth.     INVOKANA  100 MG TABS tablet      ipratropium (ATROVENT ) 0.03 % nasal spray Place 1 spray into both nostrils at bedtime as needed for rhinitis.      mometasone (NASONEX) 50 MCG/ACT nasal spray Place 2 sprays into the nose daily.     montelukast  (SINGULAIR ) 10 MG tablet Take 10 mg by mouth every morning.     MOUNJARO  15 MG/0.5ML Pen Inject 15 mg into the skin once a week.     Multiple Vitamin (MULTIVITAMIN) capsule Take 1 capsule by mouth daily.     naproxen (NAPROSYN) 500 MG tablet Take 500 mg by mouth 2  (two) times daily with a meal.     Nebivolol  HCl 20 MG TABS TAKE 1 TABLET (20 MG TOTAL) BY MOUTH DAILY. 90 tablet 1   rosuvastatin (CRESTOR) 5 MG tablet rosuvastatin 5 mg tablet     spironolactone  (ALDACTONE ) 25 MG tablet Take 1 tablet (25 mg total) by mouth daily. 90 tablet 30   valsartan -hydrochlorothiazide  (DIOVAN -HCT) 320-25 MG per tablet Take 1 tablet by mouth every morning.     No facility-administered medications prior to visit.    PAST MEDICAL HISTORY: Past Medical History:  Diagnosis Date   Allergy    Anemia    as teenager   Arthritis    hands, hip   Ascending aortic aneurysm (HCC) 04/23/2022   Back pain    BPPV (benign paroxysmal positional vertigo)    History of   Cancer (HCC)    melanoma removed from leg years ago, malignant mole   Cataract    Chronic back pain    Chronic diastolic heart failure (HCC) 04/23/2022   Complication of anesthesia    woke up and was confused and combative after back surgery   Constipation    DDD (degenerative disc disease), lumbar    Diabetes mellitus without complication (HCC)  Diabetic neuropathy [facility])    ED (erectile dysfunction)    Essential hypertension 10/16/2015   Fatty liver    First degree AV block    EKG 12/03/16   GERD (gastroesophageal reflux disease)    diet controlled, no meds currently, history of   H/O hiatal hernia    History of elevated PSA    Nocturia more than twice per night 12/21/2013   OA (osteoarthritis)    Obesity hypoventilation syndrome (HCC) 04/07/2014   Obesity, morbid (HCC) 12/21/2013   OSA on CPAP 04/07/2014   Pure hypercholesterolemia 04/18/2021   RBBB    previous EKG   Sleep apnea    uses CPAP nightly   Snoring 12/21/2013   Spondylosis 2014   lumbar back, noted on Tommy    Ulcer    stomach ulcer as teenager    PAST SURGICAL HISTORY: Past Surgical History:  Procedure Laterality Date   BACK SURGERY     done approx. 24 years ago   CARDIOVASCULAR STRESS TEST     done approx 10  years ago, Dr. Onita   COLONOSCOPY  2006   hx of with removal of polyps/[facility]   COLONOSCOPY WITH PROPOFOL  N/A 09/06/2015   Procedure: COLONOSCOPY WITH PROPOFOL ;  Surgeon: Gwendlyn ONEIDA Buddy, MD;  Location: [facility] END[facility];  Service: Endoscopy;  Laterality: N/A;   lipoma removed     from leg   LUMBAR LAMINECTOMY/DECOMPRESSION MICRODISCECTOMY  06/19/2012   Procedure: LUMBAR LAMINECTOMY/DECOMPRESSION MICRODISCECTOMY 1 LEVEL;  Surgeon: Donaciano Sprang, MD;  Location: [facility] OR;  Service: Orthopedics;  Laterality: Left;  L2-3 LEFT MICRODISCECTOMY   thigh surgery     fatty deposit   TONSILLECTOMY     TOTAL HIP ARTHROPLASTY Right 06/05/2017   Procedure: RIGHT TOTAL HIP ARTHROPLASTY ANTERIOR APPROACH;  Surgeon: Melodi Lerner, MD;  Location: [facility] ORS;  Service: Orthopedics;  Laterality: Right;  Dr. requested 120 minutes   WISDOM TOOTH EXTRACTION     x 1 tooth    FAMILY HISTORY: Family History  Problem Relation Age of Onset   Dementia Mother    Colon polyps Mother    Heart failure Father    Breast cancer Sister    Heart attack Sister    Thyroid  disease Sister    Sleep apnea Brother    Colon cancer Neg Hx    Rectal cancer Neg Hx    Stomach cancer Neg Hx    Esophageal cancer Neg Hx     SOCIAL HISTORY: Social History   Socioeconomic History   Marital status: Married    Spouse name: Roselie   Number of children: 2   Years of education: Masters   Highest education level: Not on file  Occupational History    Employer: RETIRED  Tobacco Use   Smoking status: Never   Smokeless tobacco: Never  Vaping Use   Vaping status: Never Used  Substance and Sexual Activity   Alcohol  use: No    Alcohol /week: 0.0 standard drinks of alcohol    Drug use: No   Sexual activity: Not on file  Other Topics Concern   Not on file  Social History Narrative   Patient is married Pensions consultant) and lives at home with his wife.   Patient has two adult children.   Patient is retired.   Patient has a Master's  degree.   Patient is right-handed   Patient does not drink any caffeine.   Social Drivers of Corporate investment banker Strain: Not on file  Food Insecurity: Not on file  Transportation  Needs: Not on file  Physical Activity: Not on file  Stress: Not on file  Social Connections: Unknown (10/12/2022)   Received from [facility]   Social Network    Social Network: Not on file  Intimate Partner Violence: Unknown (10/12/2022)   Received from [facility]   HITS    Physically Hurt: Not on file    Insult or Talk Down To: Not on file    Threaten Physical Harm: Not on file    Scream or Curse: Not on file      PHYSICAL EXAM  Vitals:   01/01/24 0942  BP: 115/78  Pulse: 70  SpO2: 93%  Weight: (!) 338 lb (153.3 kg)  Height: 6' 1.5 (1.867 m)       Body mass index is 43.99 kg/m.  Generalized: Well developed, in no acute distress  Cardiology: normal rate and rhythm, no murmur noted Respiratory: clear to auscultation bilaterally  Neurological examination  Mentation: Alert oriented to time, place, history taking. Follows all commands speech and language fluent Cranial nerve II-XII: Pupils were equal round reactive to light. Extraocular movements were full, visual field were full  Motor: The motor testing reveals 5 over 5 strength of all 4 extremities. Good symmetric motor tone is noted throughout.  Gait and station: Gait is arthritic but stable without assistive device.   DIAGNOSTIC DATA (LABS, IMAGING, TESTING) - I reviewed patient records, labs, notes, testing and imaging myself where available.      No data to display           Lab Results  Component Value Date   WBC 17.2 (H) 06/06/2017   HGB 13.9 06/06/2017   HCT 41.8 06/06/2017   MCV 90.3 06/06/2017   PLT 248 06/06/2017      Component Value Date/Time   NA 141 09/24/2023 1129   K 4.0 09/24/2023 1129   CL 101 09/24/2023 1129   CO2 22 09/24/2023 1129   GLUCOSE 150 (H) 09/24/2023 1129   GLUCOSE 155 (H)  06/06/2017 0601   BUN 17 09/24/2023 1129   CREATININE 0.86 09/24/2023 1129   CALCIUM 8.9 09/24/2023 1129   PROT 7.4 05/30/2017 0902   ALBUMIN 4.2 05/30/2017 0902   AST 21 05/30/2017 0902   ALT 17 05/30/2017 0902   ALKPHOS 115 05/30/2017 0902   BILITOT 1.2 05/30/2017 0902   GFRNONAA >60 06/06/2017 0601   GFRAA >60 06/06/2017 0601   No results found for: CHOL, HDL, LDLCALC, LDLDIRECT, TRIG, CHOLHDL Lab Results  Component Value Date   HGBA1C 5.2 05/30/2017   No results found for: VITAMINB12 Lab Results  Component Value Date   TSH 3.200 07/29/2023       ASSESSMENT AND PLAN 78 y.o. year old male  has a past medical history of Allergy, Anemia, Arthritis, Ascending aortic aneurysm (HCC) (04/23/2022), Back pain, BPPV (benign paroxysmal positional vertigo), Cancer (HCC), Cataract, Chronic back pain, Chronic diastolic heart failure (HCC) (87/95/7976), Complication of anesthesia, Constipation, DDD (degenerative disc disease), lumbar, Diabetes mellitus without complication (HCC), Diabetic neuropathy (HCC), ED (erectile dysfunction), Essential hypertension (10/16/2015), Fatty liver, First degree AV block, GERD (gastroesophageal reflux disease), H/O hiatal hernia, History of elevated PSA, Nocturia more than twice per night (12/21/2013), OA (osteoarthritis), Obesity hypoventilation syndrome (HCC) (04/07/2014), Obesity, morbid (HCC) (12/21/2013), OSA on CPAP (04/07/2014), Pure hypercholesterolemia (04/18/2021), RBBB, Sleep apnea, Snoring (12/21/2013), Spondylosis (2014), and Ulcer. here with     ICD-10-CM   1. OSA on CPAP  G47.33 For home use only DME continuous positive airway pressure (  CPAP)      Anoop is doing well on CPAP therapy.  Compliance report reveals excellent compliance.  He was encouraged to continue using CPAP nightly and for greater than 4 hours each night. ESS has improved. He will monitor closely. Continue working toward [employer] goals. May consider  melatonin as needed. Healthy lifestyle habits with well-balanced diet regular exercise advised.  He will return to see me in 1 year.  He verbalizes understanding and agreement with this plan.   Orders Placed This Encounter  Procedures   For home use only DME continuous positive airway pressure (CPAP)    Heated Humidity with all supplies as needed    Length of Need:   Lifetime    Patient has OSA or probable OSA:   Yes    Is the patient currently using CPAP in the home:   Yes    Settings:   Other see comments    CPAP supplies needed:   Mask, headgear, cushions, filters, heated tubing and water  chamber      No orders of the defined types were placed in this encounter.    Greig Forbes, FNP-C 01/01/2024, 10:21 AM [facility] [address], [address] [place], [place] [address] [phone]

## 2024-01-02 DIAGNOSIS — J45998 Other asthma: Secondary | ICD-10-CM | POA: Diagnosis not present

## 2024-01-02 DIAGNOSIS — G4733 Obstructive sleep apnea (adult) (pediatric): Secondary | ICD-10-CM | POA: Diagnosis not present

## 2024-01-07 DIAGNOSIS — E662 Morbid (severe) obesity with alveolar hypoventilation: Secondary | ICD-10-CM | POA: Diagnosis not present

## 2024-01-07 DIAGNOSIS — K760 Fatty (change of) liver, not elsewhere classified: Secondary | ICD-10-CM | POA: Diagnosis not present

## 2024-01-07 DIAGNOSIS — E1140 Type 2 diabetes mellitus with diabetic neuropathy, unspecified: Secondary | ICD-10-CM | POA: Diagnosis not present

## 2024-01-07 DIAGNOSIS — I10 Essential (primary) hypertension: Secondary | ICD-10-CM | POA: Diagnosis not present

## 2024-01-07 DIAGNOSIS — I5189 Other ill-defined heart diseases: Secondary | ICD-10-CM | POA: Diagnosis not present

## 2024-01-07 DIAGNOSIS — E785 Hyperlipidemia, unspecified: Secondary | ICD-10-CM | POA: Diagnosis not present

## 2024-01-07 DIAGNOSIS — G4733 Obstructive sleep apnea (adult) (pediatric): Secondary | ICD-10-CM | POA: Diagnosis not present

## 2024-01-09 ENCOUNTER — Ambulatory Visit (HOSPITAL_COMMUNITY)
Admission: RE | Admit: 2024-01-09 | Discharge: 2024-01-09 | Disposition: A | Discharge status: Home / Self Care | Source: Ambulatory Visit | Attending: Cardiology | Admitting: Cardiology

## 2024-01-09 DIAGNOSIS — I7121 Aneurysm of the ascending aorta, without rupture: Secondary | ICD-10-CM | POA: Insufficient documentation

## 2024-01-09 DIAGNOSIS — I2699 Other pulmonary embolism without acute cor pulmonale: Secondary | ICD-10-CM | POA: Diagnosis not present

## 2024-01-09 DIAGNOSIS — K449 Diaphragmatic hernia without obstruction or gangrene: Secondary | ICD-10-CM | POA: Diagnosis not present

## 2024-01-09 MED ORDER — IOHEXOL 350 MG/ML SOLN
100.0000 mL | Freq: Once | INTRAVENOUS | Status: AC | PRN
  Administered 2024-01-09: 100 mL via INTRAVENOUS

## 2024-01-16 ENCOUNTER — Ambulatory Visit

## 2024-01-16 VITALS — BP 124/64 | HR 75 | Resp 20 | Ht 73.5 in | Wt 339.0 lb

## 2024-01-16 DIAGNOSIS — I7121 Aneurysm of the ascending aorta, without rupture: Secondary | ICD-10-CM | POA: Diagnosis not present

## 2024-01-16 NOTE — Patient Instructions (Signed)

## 2024-01-16 NOTE — Progress Notes (Signed)
 [address] [place] [address]             [phone]            Tommy [name] [id number] 05-Jul-1945   History of Present Illness: [name] is a 78 year old male with medical history of hypertension, right bundle branch block, chronic diastolic heart failure, OSA, type 2 diabetes, lumbar disc herniation, osteoarthritis and obesity who presents for continued follow-up of ascending thoracic aortic aneurysm.  Aneurysm has stayed stable in size at 4.7 cm.  His blood pressure is well-controlled with current medication therapy.  He was recently started on spironolactone  and noticed a dramatic decrease.  Home readings are in the 110s-120s/60s-70s.  He is not able to exercise much due to his back surgeries and he is about to have shoulder replacement surgery.  He denies chest pain, shortness of breath and lower leg swelling    Current Outpatient Medications on File Prior to Visit  Medication Sig Dispense Refill   ACCU-CHEK AVIVA PLUS test strip      Accu-Chek Softclix Lancets lancets      amLODipine  (NORVASC ) 10 MG tablet Take 10 mg by mouth every morning.      aspirin EC 81 MG tablet Take 81 mg by mouth daily.     cyclobenzaprine (FLEXERIL) 5 MG tablet Take 5 mg by mouth as needed for muscle spasms.     doxazosin  (CARDURA ) 2 MG tablet Take 1 tablet (2 mg total) by mouth 2 (two) times daily. Take with 4 mg tablet to total 6 mg twice a day 180 tablet 3   doxazosin  (CARDURA ) 4 MG tablet Take 1 tablet (4 mg total) by mouth 2 (two) times daily. Take one tablet with your 2mg  tablet to make 6mg  90 tablet 3   FIBER PO Take 2 tablets by mouth every morning.     furosemide (LASIX) 40 MG tablet Take 40 mg by mouth as needed for fluid or edema. Prn     Glucosamine-Chondroit-Vit C-Mn (GLUCOSAMINE 1500 COMPLEX PO) Take by mouth.     INVOKANA  100 MG TABS tablet      ipratropium (ATROVENT ) 0.03 % nasal spray Place 1 spray into both nostrils at  bedtime as needed for rhinitis.      mometasone (NASONEX) 50 MCG/ACT nasal spray Place 2 sprays into the nose daily.     montelukast  (SINGULAIR ) 10 MG tablet Take 10 mg by mouth every morning.     MOUNJARO  15 MG/0.5ML Pen Inject 15 mg into the skin once a week.     Multiple Vitamin (MULTIVITAMIN) capsule Take 1 capsule by mouth daily.     naproxen (NAPROSYN) 500 MG tablet Take 500 mg by mouth 2 (two) times daily with a meal.     Nebivolol  HCl 20 MG TABS TAKE 1 TABLET (20 MG TOTAL) BY MOUTH DAILY. 90 tablet 1   rosuvastatin (CRESTOR) 5 MG tablet rosuvastatin 5 mg tablet     spironolactone  (ALDACTONE ) 25 MG tablet Take 1 tablet (25 mg total) by mouth daily. 90 tablet 30   valsartan -hydrochlorothiazide  (DIOVAN -HCT) 320-25 MG per tablet Take 1 tablet by mouth every morning.     No current facility-administered medications on file prior to visit.     ROS: Review of Systems  Constitutional: Negative.  Negative for malaise/fatigue.  Respiratory: Negative.  Negative for cough and shortness of breath.   Cardiovascular: Negative.  Negative for chest pain and leg  swelling.  Musculoskeletal:  Positive for back pain and joint pain.     BP 124/64   Pulse 75   Resp 20   Ht 6' 1.5 (1.867 m)   Wt (!) 339 lb (153.8 kg)   SpO2 95% Comment: RA  BMI 44.12 kg/m   Physical Exam Constitutional:      Appearance: Normal appearance.  HENT:     Head: Normocephalic and atraumatic.  Cardiovascular:     Rate and Rhythm: Normal rate and regular rhythm.     Heart sounds: Normal heart sounds, S1 normal and S2 normal.  Pulmonary:     Effort: Pulmonary effort is normal.     Breath sounds: Normal breath sounds.  Skin:    General: Skin is warm and dry.  Neurological:     General: No focal deficit present.     Mental Status: He is alert and oriented to person, place, and time.      Imaging: CLINICAL DATA:  Aortic aneurysm suspected   EXAM: CT ANGIOGRAPHY CHEST WITH CONTRAST    TECHNIQUE: Multidetector CT imaging through the chest was performed using the standard protocol during bolus administration of intravenous contrast. Multiplanar reconstructed images and MIPs were obtained and reviewed to evaluate the vascular anatomy.   RADIATION DOSE REDUCTION: This exam was performed according to the departmental dose-optimization program which includes automated exposure control, adjustment of the mA and/or kV according to patient size and/or use of iterative reconstruction technique.   CONTRAST:  OMNIPAQUE  IOHEXOL  350 MG/ML SOLN   COMPARISON:  January 14, 2023, June 21, 2022   FINDINGS: Pulmonary Embolism: Nondiagnostic evaluation for pulmonary embolus due to phase of contrast.   Cardiovascular: No cardiomegaly or pericardial effusion. Similar fusiform dilation of the ascending aorta, measuring 4.7 cm. The aortic arch and descending aorta are dilated, measuring 4.1 cm. Similar dilation of the distal descending aorta, measuring 3.8 cm. No aortic dissection. No hemodynamically significant stenosis.   Mediastinum/Nodes: No mediastinal mass. No mediastinal, hilar, or axillary lymphadenopathy. Small hiatal hernia.   Lungs/Pleura: The midline trachea and bronchi are patent. Small amount of layering debris or secretion in the upper trachea. No focal airspace consolidation, pleural effusion, or pneumothorax. Bilateral upper and lower lobe posterior, dependent atelectasis.   Musculoskeletal: No acute fracture or destructive bone lesion. Severe bilateral glenohumeral joint osteoarthritis. Multilevel degenerative disc disease of the spine. Moderate volume bilateral gynecomastia.   Upper Abdomen: No acute abnormality within the partially visualized upper abdomen.   Review of the MIP images confirms the above findings.   IMPRESSION: 1. Unchanged thoracic aortic aneurysm, most pronounced within the ascending aorta, which measures 4.7 cm at maximal  dimension. No aortic dissection. Continued follow-up could be considered, as documented below. 2. No pneumonia, pulmonary edema, or pleural effusion. 3. Small hiatal hernia.   Ascending thoracic aortic aneurysm. Recommend semi-annual imaging followup by CTA or MRA and referral to cardiothoracic surgery if not already obtained. This recommendation follows 2010 ACCF/AHA/AATS/ACR/ASA/SCA/SCAI/SIR/STS/SVM Guidelines for the Diagnosis and Management of Patients With Thoracic Aortic Disease. Circulation. 2010; 121: Z733-z630. Aortic aneurysm NOS (ICD10-I71.9)     Electronically Signed   By: Rogelia Myers M.D.   On: 01/09/2024 11:5     A/P: Aneurysm of ascending aorta without rupture (HCC) - 4.7 cm ascending thoracic aortic aneurysm on CTA of chest. We discussed the natural history and and risk factors for growth of ascending aortic aneurysms. Discussed recommendations to minimize the risk of further expansion or dissection including careful blood pressure control, avoidance  of contact sports and heavy lifting, attention to lipid management.  We covered the importance of staying never user tobacco.  The patient does not yet meet surgical criteria of >5.5cm. The patient is aware of signs and symptoms of aortic dissection and when to present to the emergency department    - Follow-up in 1 year with CTA of chest for continued surveillance of ascending aortic aneurysm.   Risk Modification:  Statin:  rosuvastatin  Smoking cessation instruction/counseling given:  never user  Patient was counseled on importance of Blood Pressure Control  They are instructed to contact their Primary Care Physician if they start to have blood pressure readings over 130s/90s. Do not ever stop blood pressure medications on your own, unless instructed by healthcare professional.  Please avoid use of Fluoroquinolones as this can potentially increase your risk of Aortic Rupture and/or Dissection  Patient educated  on signs and symptoms of Aortic Dissection, handout also provided in AVS  Manuelita CHRISTELLA Rough, PA-C 01/16/24

## 2024-02-03 ENCOUNTER — Other Ambulatory Visit: Payer: Self-pay | Admitting: Cardiovascular Disease

## 2024-02-18 DIAGNOSIS — E1165 Type 2 diabetes mellitus with hyperglycemia: Secondary | ICD-10-CM | POA: Diagnosis not present

## 2024-02-18 DIAGNOSIS — Z125 Encounter for screening for malignant neoplasm of prostate: Secondary | ICD-10-CM | POA: Diagnosis not present

## 2024-02-18 DIAGNOSIS — E7849 Other hyperlipidemia: Secondary | ICD-10-CM | POA: Diagnosis not present

## 2024-02-18 DIAGNOSIS — I119 Hypertensive heart disease without heart failure: Secondary | ICD-10-CM | POA: Diagnosis not present

## 2024-02-18 DIAGNOSIS — E785 Hyperlipidemia, unspecified: Secondary | ICD-10-CM | POA: Diagnosis not present

## 2024-02-18 DIAGNOSIS — K219 Gastro-esophageal reflux disease without esophagitis: Secondary | ICD-10-CM | POA: Diagnosis not present

## 2024-02-18 DIAGNOSIS — Z1389 Encounter for screening for other disorder: Secondary | ICD-10-CM | POA: Diagnosis not present

## 2024-02-18 DIAGNOSIS — Z1212 Encounter for screening for malignant neoplasm of rectum: Secondary | ICD-10-CM | POA: Diagnosis not present

## 2024-02-24 DIAGNOSIS — I712 Thoracic aortic aneurysm, without rupture, unspecified: Secondary | ICD-10-CM | POA: Diagnosis not present

## 2024-02-24 DIAGNOSIS — Z1331 Encounter for screening for depression: Secondary | ICD-10-CM | POA: Diagnosis not present

## 2024-02-24 DIAGNOSIS — E1165 Type 2 diabetes mellitus with hyperglycemia: Secondary | ICD-10-CM | POA: Diagnosis not present

## 2024-02-24 DIAGNOSIS — R82998 Other abnormal findings in urine: Secondary | ICD-10-CM | POA: Diagnosis not present

## 2024-02-24 DIAGNOSIS — I872 Venous insufficiency (chronic) (peripheral): Secondary | ICD-10-CM | POA: Diagnosis not present

## 2024-02-24 DIAGNOSIS — I5189 Other ill-defined heart diseases: Secondary | ICD-10-CM | POA: Diagnosis not present

## 2024-02-24 DIAGNOSIS — I10 Essential (primary) hypertension: Secondary | ICD-10-CM | POA: Diagnosis not present

## 2024-02-24 DIAGNOSIS — Z0000 Encounter for general adult medical examination without abnormal findings: Secondary | ICD-10-CM | POA: Diagnosis not present

## 2024-02-24 DIAGNOSIS — E662 Morbid (severe) obesity with alveolar hypoventilation: Secondary | ICD-10-CM | POA: Diagnosis not present

## 2024-02-24 DIAGNOSIS — E1140 Type 2 diabetes mellitus with diabetic neuropathy, unspecified: Secondary | ICD-10-CM | POA: Diagnosis not present

## 2024-02-28 NOTE — Progress Notes (Addendum)
 PCP - Norleen Russo,MD Cardiologist - Annabella Scarce  Televisit 12-25-23 preop epic  PPM/ICD -  Device Orders -  Rep Notified -   Chest x-ray - CTA chest 01-09-24 epic EKG - 04-16-23 epic Stress Test -  ECHO - 05-10-22 epic Cardiac Cath -  CBC,CMP ,HGbA1c (6.2) 02-18-24 in chart   Sleep Study - Y CPAP - yes  Fasting Blood Sugar - 116 or less Checks Blood Sugar _1____ times a day  Blood Thinner Instructions: Aspirin Instructions:81 mg asa   ERAS Protcol - PRE-SURGERY  G2-   MOUNJARO - LAST DOSE-02-29-24 Invokana  - Last dose 03-08-24  COVID vaccine -  Activity--Able to climb a flight of stairs with no CP or SOB Anesthesia review: Aortic Aneurysm , DM , HTN, OSA, CHF  Patient denies shortness of breath, fever, cough and chest pain at PAT appointment   All instructions explained to the patient, with a verbal understanding of the material. Patient agrees to go over the instructions while at home for a better understanding. Patient also instructed to self quarantine after being tested for COVID-19. The opportunity to ask questions was provided.

## 2024-02-28 NOTE — Patient Instructions (Addendum)
 SURGICAL WAITING ROOM VISITATION  Patients having surgery or a procedure may have no more than 2 support people in the waiting area - these visitors may rotate.    Children under the age of 95 must have an adult with them who is not the patient.  Visitors with respiratory illnesses are discouraged from visiting and should remain at home.  If the patient needs to stay at the hospital during part of their recovery, the visitor guidelines for inpatient rooms apply. Pre-op nurse will coordinate an appropriate time for 1 support person to accompany patient in pre-op.  This support person may not rotate.    Please refer to the [facility] website for the visitor guidelines for Inpatients (after your surgery is over and you are in a regular room).       Your procedure is scheduled on: 03-12-24   Report to [facility] Main Entrance    Report to admitting at AM   Call this number if you have problems the morning of surgery [phone]   Do not eat food :After Midnight.   After Midnight you may have the following liquids until _0430 _____ AM/  DAY OF SURGERY  then nothing by mouth  Water  Non-Citrus Juices (without pulp, NO RED-Apple, White grape, White cranberry) Black Coffee (NO MILK/CREAM OR CREAMERS, sugar ok)  Clear Tea (NO MILK/CREAM OR CREAMERS, sugar ok) regular and decaf                             Plain Jell-O (NO RED)                                           Fruit ices (not with fruit pulp, NO RED)                                     Popsicles (NO RED)                                                               Sports drinks like Gatorade (NO RED)                    The day of surgery:  Drink ONE (1) Pre-Surgery G2 BY   0430 AM the morning of surgery. Drink in one sitting. Do not sip.  This drink was given to you during your hospital  pre-op appointment visit. Nothing else to drink after completing the  Pre-Surgery  G2.          If you have questions, please  contact your surgeon's office.   FOLLOW  ANY ADDITIONAL PRE OP INSTRUCTIONS YOU RECEIVED FROM YOUR SURGEON'S OFFICE!!!     Oral Hygiene is also important to reduce your risk of infection.                                    Remember - BRUSH YOUR TEETH THE MORNING OF SURGERY WITH YOUR REGULAR TOOTHPASTE  DENTURES WILL BE  REMOVED PRIOR TO SURGERY PLEASE DO NOT APPLY Poly grip OR ADHESIVES!!!   Do NOT smoke after Midnight   Stop all vitamins and herbal supplements 7 days before surgery.   Take these medicines the morning of surgery with A SIP OF WATER : nebivolol , singulair , nasal spray, doxazosin , amlodipine   How to Manage Your Diabetes Before and After Surgery  Why is it important to control my blood sugar before and after surgery? Improving blood sugar levels before and after surgery helps healing and can limit problems. A way of improving blood sugar control is eating a healthy diet by:  Eating less sugar and carbohydrates  Increasing activity/exercise  Talking with your doctor about reaching your blood sugar goals High blood sugars (greater than 180 mg/dL) can raise your risk of infections and slow your recovery, so you will need to focus on controlling your diabetes during the weeks before surgery. Make sure that the doctor who takes care of your diabetes knows about your planned surgery including the date and location.  How do I manage my blood sugar before surgery? Check your blood sugar at least 4 times a day, starting 2 days before surgery, to make sure that the level is not too high or low. Check your blood sugar the morning of your surgery when you wake up and every 2 hours until you get to the Short Stay unit. If your blood sugar is less than 70 mg/dL, you will need to treat for low blood sugar: Do not take insulin . Treat a low blood sugar (less than 70 mg/dL) with  cup of clear juice (cranberry or apple), 4 glucose tablets, OR glucose gel. Recheck blood sugar in 15  minutes after treatment (to make sure it is greater than 70 mg/dL). If your blood sugar is not greater than 70 mg/dL on recheck, call [phone] for further instructions. Report your blood sugar to the short stay nurse when you get to Short Stay.  If you are admitted to the hospital after surgery: Your blood sugar will be checked by the staff and you will probably be given insulin  after surgery (instead of oral diabetes medicines) to make sure you have good blood sugar levels. The goal for blood sugar control after surgery is 80-180 mg/dL.   WHAT DO I DO ABOUT MY DIABETES MEDICATION?  HOLD INOVOKANA 72 HOURS PRIOR TO DAY OF SURGERY LAST DOSE-03-08-24   DO NOT TAKE THE FOLLOWING 7 DAYS PRIOR TO SURGERY:  Mounjaro  (Tirzepatide )   Bring CPAP mask and tubing day of surgery.                              You may not have any metal on your body including jewelry, and body piercing             Do not wear  lotions, powders, /cologne, or deodorant               Men may shave face and neck.   Do not bring valuables to the hospital. [facility] IS NOT             RESPONSIBLE   FOR VALUABLES.   Contacts, glasses, dentures or bridgework may not be worn into surgery.   Bring small overnight bag day of surgery.   DO NOT BRING YOUR HOME MEDICATIONS TO THE HOSPITAL. PHARMACY WILL DISPENSE MEDICATIONS LISTED ON YOUR MEDICATION LIST TO YOU DURING YOUR ADMISSION IN THE HOSPITAL!  Patients discharged on the day of surgery will not be allowed to drive home.  Someone NEEDS to stay with you for the first 24 hours after anesthesia.   Special Instructions: Bring a copy of your healthcare power of attorney and living will documents the day of surgery if you haven't scanned them before.              Please read over the following fact sheets you were given: IF YOU HAVE QUESTIONS ABOUT YOUR PRE-OP INSTRUCTIONS PLEASE CALL [phone].   . If you test positive for Covid or have been in contact with  anyone that has tested positive in the last 10 days please notify you surgeon.   [facility]- Preparing for Total Shoulder Arthroplasty    Before surgery, you can play an important role. Because skin is not sterile, your skin needs to be as free of germs as possible. You can reduce the number of germs on your skin by using the following products. Benzoyl Peroxide Gel Reduces the number of germs present on the skin Applied twice a day to shoulder area starting two days before surgery    ==================================================================  Please follow these instructions carefully:  BENZOYL PEROXIDE 5% GEL  Please do not use if you have an allergy to benzoyl peroxide.   If your skin becomes reddened/irritated stop using the benzoyl peroxide.  Starting two days before surgery, apply as follows: Apply benzoyl peroxide in the morning and at night. Apply after taking a shower. If you are not taking a shower clean entire shoulder front, back, and side along with the armpit with a clean wet washcloth.  Place a quarter-sized dollop on your shoulder and rub in thoroughly, making sure to cover the front, back, and side of your shoulder, along with the armpit.   2 days before ____ AM   ____ PM              1 day before ____ AM   ____ PM                         Do this twice a day for two days.  (Last application is the night before surgery, AFTER using the CHG soap as described below).  Do NOT apply benzoyl peroxide gel on the day of surgery.      Pre-operative 4 CHG Bath Instructions  DYNA-Hex 4 Chlorhexidine  Gluconate 4% Solution Antiseptic 4 fl. oz   You can play a key role in reducing the risk of infection after surgery. Your skin needs to be as free of germs as possible. You can reduce the number of germs on your skin by washing with CHG (chlorhexidine  gluconate) soap before surgery. CHG is an antiseptic soap that kills germs and continues to kill germs even after washing.    DO NOT use if you have an allergy to chlorhexidine /CHG or antibacterial soaps. If your skin becomes reddened or irritated, stop using the CHG and notify one of our RNs at   Please shower with the CHG soap starting 4 days before surgery using the following schedule:     Please keep in mind the following:  DO NOT shave, including legs and underarms, starting the day of your first shower.   You may shave your face at any point before/day of surgery.  Place clean sheets on your bed the day you start using CHG soap. Use a clean washcloth (not used since being washed) for each shower. DO  NOT sleep with pets once you start using the CHG.  CHG Shower Instructions:  If you choose to wash your hair and private area, wash first with your normal shampoo/soap.  After you use shampoo/soap, rinse your hair and body thoroughly to remove shampoo/soap residue.  Turn the water  OFF and apply about 3 tablespoons (45 ml) of CHG soap to a CLEAN washcloth.  Apply CHG soap ONLY FROM YOUR NECK DOWN TO YOUR TOES (washing for 3-5 minutes)  DO NOT use CHG soap on face, private areas, open wounds, or sores.  Pay special attention to the area where your surgery is being performed.  If you are having back surgery, having someone wash your back for you may be helpful. Wait 2 minutes after CHG soap is applied, then you may rinse off the CHG soap.  Pat dry with a clean towel  Put on clean clothes/pajamas   If you choose to wear lotion, please use ONLY the CHG-compatible lotions on the back of this paper.     Additional instructions for the day of surgery: DO NOT APPLY any lotions, deodorants, cologne, or perfumes.   Put on clean/comfortable clothes.  Brush your teeth.  Ask your nurse before applying any prescription medications to the skin.   CHG Compatible Lotions   Aveeno Moisturizing lotion  Cetaphil Moisturizing Cream  Cetaphil Moisturizing Lotion  Clairol Herbal Essence Moisturizing Lotion, Dry Skin   Clairol Herbal Essence Moisturizing Lotion, Extra Dry Skin  Clairol Herbal Essence Moisturizing Lotion, Normal Skin  Curel Age Defying Therapeutic Moisturizing Lotion with Alpha Hydroxy  Curel Extreme Care Body Lotion  Curel Soothing Hands Moisturizing Hand Lotion  Curel Therapeutic Moisturizing Cream, Fragrance-Free  Curel Therapeutic Moisturizing Lotion, Fragrance-Free  Curel Therapeutic Moisturizing Lotion, Original Formula  Eucerin Daily Replenishing Lotion  Eucerin Dry Skin Therapy Plus Alpha Hydroxy Crme  Eucerin Dry Skin Therapy Plus Alpha Hydroxy Lotion  Eucerin Original Crme  Eucerin Original Lotion  Eucerin Plus Crme Eucerin Plus Lotion  Eucerin TriLipid Replenishing Lotion  Keri Anti-Bacterial Hand Lotion  Keri Deep Conditioning Original Lotion Dry Skin Formula Softly Scented  Keri Deep Conditioning Original Lotion, Fragrance Free Sensitive Skin Formula  Keri Lotion Fast Absorbing Fragrance Free Sensitive Skin Formula  Keri Lotion Fast Absorbing Softly Scented Dry Skin Formula  Keri Original Lotion  Keri Skin Renewal Lotion Keri Silky Smooth Lotion  Keri Silky Smooth Sensitive Skin Lotion  Nivea Body Creamy Conditioning Oil  Nivea Body Extra Enriched Lotion  Nivea Body Original Lotion  Nivea Body Sheer Moisturizing Lotion Nivea Crme  Nivea Skin Firming Lotion  NutraDerm 30 Skin Lotion  NutraDerm Skin Lotion  NutraDerm Therapeutic Skin Cream  NutraDerm Therapeutic Skin Lotion  ProShield Protective Hand Cream  Provon moisturizing lotion   Incentive Spirometer  An incentive spirometer is a tool that can help keep your lungs clear and active. This tool measures how well you are filling your lungs with each breath. Taking long deep breaths may help reverse or decrease the chance of developing breathing (pulmonary) problems (especially infection) following: A long period of time when you are unable to move or be active. BEFORE THE PROCEDURE  If the spirometer  includes an indicator to show your best effort, your nurse or respiratory therapist will set it to a desired goal. If possible, sit up straight or lean slightly forward. Try not to slouch. Hold the incentive spirometer in an upright position. INSTRUCTIONS FOR USE  Sit on the edge of your bed if possible, or sit up  as far as you can in bed or on a chair. Hold the incentive spirometer in an upright position. Breathe out normally. Place the mouthpiece in your mouth and seal your lips tightly around it. Breathe in slowly and as deeply as possible, raising the piston or the ball toward the top of the column. Hold your breath for 3-5 seconds or for as long as possible. Allow the piston or ball to fall to the bottom of the column. Remove the mouthpiece from your mouth and breathe out normally. Rest for a few seconds and repeat Steps 1 through 7 at least 10 times every 1-2 hours when you are awake. Take your time and take a few normal breaths between deep breaths. The spirometer may include an indicator to show your best effort. Use the indicator as a goal to work toward during each repetition. After each set of 10 deep breaths, practice coughing to be sure your lungs are clear. If you have an incision (the cut made at the time of surgery), support your incision when coughing by placing a pillow or rolled up towels firmly against it. Once you are able to get out of bed, walk around indoors and cough well. You may stop using the incentive spirometer when instructed by your caregiver.  RISKS AND COMPLICATIONS Take your time so you do not get dizzy or light-headed. If you are in pain, you may need to take or ask for pain medication before doing incentive spirometry. It is harder to take a deep breath if you are having pain. AFTER USE Rest and breathe slowly and easily. It can be helpful to keep track of a log of your progress. Your caregiver can provide you with a simple table to help with this. If you are  using the spirometer at home, follow these instructions: SEEK MEDICAL CARE IF:  You are having difficultly using the spirometer. You have trouble using the spirometer as often as instructed. Your pain medication is not giving enough relief while using the spirometer. You develop fever of 100.5 F (38.1 C) or higher. SEEK IMMEDIATE MEDICAL CARE IF:  You cough up bloody sputum that had not been present before. You develop fever of 102 F (38.9 C) or greater. You develop worsening pain at or near the incision site. MAKE SURE YOU:  Understand these instructions. Will watch your condition. Will get help right away if you are not doing well or get worse. Document Released: 09/17/2006 Document Revised: 07/30/2011 Document Reviewed: 11/18/2006 [facility] Patient Information 2014 [place], [place].   ________________________________________________________________________

## 2024-03-02 ENCOUNTER — Encounter (HOSPITAL_COMMUNITY): Payer: Self-pay

## 2024-03-02 ENCOUNTER — Encounter (HOSPITAL_COMMUNITY)
Admission: RE | Admit: 2024-03-02 | Discharge: 2024-03-02 | Disposition: A | Discharge status: Home / Self Care | Source: Ambulatory Visit | Attending: Orthopedic Surgery | Admitting: Orthopedic Surgery

## 2024-03-02 ENCOUNTER — Other Ambulatory Visit: Payer: Self-pay

## 2024-03-02 VITALS — BP 112/63 | HR 66 | Temp 98.2°F | Resp 16 | Ht 73.0 in | Wt 336.0 lb

## 2024-03-02 DIAGNOSIS — Z01812 Encounter for preprocedural laboratory examination: Secondary | ICD-10-CM | POA: Diagnosis not present

## 2024-03-02 DIAGNOSIS — I4510 Unspecified right bundle-branch block: Secondary | ICD-10-CM | POA: Insufficient documentation

## 2024-03-02 DIAGNOSIS — G4733 Obstructive sleep apnea (adult) (pediatric): Secondary | ICD-10-CM | POA: Insufficient documentation

## 2024-03-02 DIAGNOSIS — Z01818 Encounter for other preprocedural examination: Secondary | ICD-10-CM

## 2024-03-02 DIAGNOSIS — I110 Hypertensive heart disease with heart failure: Secondary | ICD-10-CM | POA: Insufficient documentation

## 2024-03-02 DIAGNOSIS — I714 Abdominal aortic aneurysm, without rupture, unspecified: Secondary | ICD-10-CM | POA: Insufficient documentation

## 2024-03-02 DIAGNOSIS — I5032 Chronic diastolic (congestive) heart failure: Secondary | ICD-10-CM | POA: Diagnosis not present

## 2024-03-02 DIAGNOSIS — I712 Thoracic aortic aneurysm, without rupture, unspecified: Secondary | ICD-10-CM | POA: Diagnosis not present

## 2024-03-02 DIAGNOSIS — E1140 Type 2 diabetes mellitus with diabetic neuropathy, unspecified: Secondary | ICD-10-CM | POA: Diagnosis not present

## 2024-03-02 DIAGNOSIS — E119 Type 2 diabetes mellitus without complications: Secondary | ICD-10-CM

## 2024-03-02 DIAGNOSIS — M75102 Unspecified rotator cuff tear or rupture of left shoulder, not specified as traumatic: Secondary | ICD-10-CM | POA: Insufficient documentation

## 2024-03-02 DIAGNOSIS — M12812 Other specific arthropathies, not elsewhere classified, left shoulder: Secondary | ICD-10-CM | POA: Insufficient documentation

## 2024-03-02 HISTORY — DX: Nausea with vomiting, unspecified: R11.2

## 2024-03-02 LAB — SURGICAL PCR SCREEN
MRSA, PCR: NEGATIVE
Staphylococcus aureus: NEGATIVE

## 2024-03-02 LAB — GLUCOSE, CAPILLARY: Glucose-Capillary: 104 mg/dL — ABNORMAL HIGH (ref 70–99)

## 2024-03-03 NOTE — Progress Notes (Signed)
 Anesthesia Chart Review   Case: [id number] Date/Time: 03/12/24 0715   Procedure: ARTHROPLASTY, SHOULDER, TOTAL, REVERSE (Left: Shoulder)   Anesthesia type: General   Diagnosis:      Rotator cuff tear arthropathy of left shoulder [M75.102, M12.812]     Primary osteoarthritis of left shoulder [M19.012]   Pre-op diagnosis: rotator cuff tear arthropathy advanced osteoarthritis   Location: [facility] ROOM 06 / [facility]   Surgeons: Melita Drivers, MD       DISCUSSION:78 y.o. never smoker with h/o PONV, HTN, RBBB, OSA on CPAP, DM II, CHF, AAA, left shoulder OA scheduled for above procedure 03/12/24 with Dr. Drivers Melita.   H/o L2-3 left microdiscectomy, no hardware in place.    Pt advised to hold Mounjaro  1 week prior to procedure.   Pt follows with vascular for ascheding thoracic aortic aneurysm. Last seen 01/16/2024, 4.7 cm ascending thoracic aortic aneurysm on recent CTA. Will follow up with repeat CTA in 1 year.   Per cardiology preoperative evaluation 12/25/2023, Mr. Dillinger perioperative risk of a major cardiac event is 0.4% according to the Revised Cardiac Risk Index (RCRI).  Therefore, he is at low risk for perioperative complications.   His functional capacity is good at 5.62 METs according to the Duke Activity Status Index (DASI). Recommendations: According to ACC/AHA guidelines, no further cardiovascular testing needed.  The patient may proceed to surgery at acceptable risk.   Antiplatelet and/or Anticoagulation Recommendations: Aspirin can be held for 5-7 days prior to his surgery.  Please resume Aspirin post operatively when it is felt to be safe from a bleeding standpoint.  VS: BP 112/63   Pulse 66   Temp 36.8 C (Oral)   Resp 16   Ht 6' 1 (1.854 m)   Wt (!) 152.4 kg   SpO2 96%   BMI 44.33 kg/m   PROVIDERS: Onita Rush, MD is PCP   Cardiologist - Annabella Scarce, MD  LABS: Labs reviewed: Acceptable for surgery. (all labs ordered are listed, but only abnormal results are  displayed)  Labs Reviewed  GLUCOSE, CAPILLARY - Abnormal; Notable for the following components:      Result Value   Glucose-Capillary 104 (*)    All other components within normal limits  SURGICAL PCR SCREEN     IMAGES: CT Angio 01/09/2024 IMPRESSION: 1. Unchanged thoracic aortic aneurysm, most pronounced within the ascending aorta, which measures 4.7 cm at maximal dimension. No aortic dissection. Continued follow-up could be considered, as documented below. 2. No pneumonia, pulmonary edema, or pleural effusion. 3. Small hiatal hernia.    EKG:   CV:  Past Medical History:  Diagnosis Date   Allergy    Anemia    as teenager   Arthritis    hands, hip   Ascending aortic aneurysm 04/23/2022   Back pain    BPPV (benign paroxysmal positional vertigo)    History of   Cancer (HCC)    melanoma removed from leg years ago, malignant mole   Cataract    Chronic back pain    Chronic diastolic heart failure (HCC) 04/23/2022   Complication of anesthesia    woke up and was confused and combative after back surgery   Constipation    DDD (degenerative disc disease), lumbar    Diabetes mellitus without complication (HCC)    Diabetic neuropathy (HCC)    ED (erectile dysfunction)    Essential hypertension 10/16/2015   Fatty liver    First degree AV block    EKG 12/03/16   GERD (gastroesophageal  reflux disease)    diet controlled, no meds currently, history of   H/O hiatal hernia    History of elevated PSA    Nocturia more than twice per night 12/21/2013   OA (osteoarthritis)    Obesity hypoventilation syndrome (HCC) 04/07/2014   Obesity, morbid (HCC) 12/21/2013   OSA on CPAP 04/07/2014   PONV (postoperative nausea and vomiting)    Pure hypercholesterolemia 04/18/2021   RBBB    previous EKG   Sleep apnea    uses CPAP nightly   Snoring 12/21/2013   Spondylosis 2014   lumbar back, noted on MR    Ulcer    stomach ulcer as teenager    Past Surgical History:   Procedure Laterality Date   BACK SURGERY     done approx. 24 years ago   1998   CARDIOVASCULAR STRESS TEST     done approx 10 years ago, Dr. Onita   COLONOSCOPY  2006   hx of with removal of polyps/[facility]   COLONOSCOPY WITH PROPOFOL  N/A 09/06/2015   Procedure: COLONOSCOPY WITH PROPOFOL ;  Surgeon: Gwendlyn ONEIDA Buddy, MD;  Location: [facility];  Service: Endoscopy;  Laterality: N/A;   lipoma removed     from leg   LUMBAR LAMINECTOMY/DECOMPRESSION MICRODISCECTOMY  06/19/2012   Procedure: LUMBAR LAMINECTOMY/DECOMPRESSION MICRODISCECTOMY 1 LEVEL;  Surgeon: Donaciano Sprang, MD;  Location: [facility];  Service: Orthopedics;  Laterality: Left;  L2-3 LEFT MICRODISCECTOMY   thigh surgery     fatty deposit   TONSILLECTOMY     TOTAL HIP ARTHROPLASTY Right 06/05/2017   Procedure: RIGHT TOTAL HIP ARTHROPLASTY ANTERIOR APPROACH;  Surgeon: Melodi Lerner, MD;  Location: [facility];  Service: Orthopedics;  Laterality: Right;  Dr. requested 120 minutes   WISDOM TOOTH EXTRACTION     x 1 tooth    MEDICATIONS:  ACCU-CHEK AVIVA PLUS test strip   Accu-Chek Softclix Lancets lancets   amLODipine  (NORVASC ) 10 MG tablet   aspirin EC 81 MG tablet   cyclobenzaprine (FLEXERIL) 5 MG tablet   docusate sodium  (COLACE) 100 MG capsule   doxazosin  (CARDURA ) 2 MG tablet   doxazosin  (CARDURA ) 4 MG tablet   FIBER PO   furosemide (LASIX) 40 MG tablet   GLUCOSAMINE-CHONDROITIN PO   INVOKANA  100 MG TABS tablet   ipratropium (ATROVENT ) 0.03 % nasal spray   montelukast  (SINGULAIR ) 10 MG tablet   MOUNJARO  15 MG/0.5ML Pen   Multiple Vitamin (MULTIVITAMIN) capsule   naproxen (NAPROSYN) 500 MG tablet   Nebivolol  HCl 20 MG TABS   NON FORMULARY   rosuvastatin (CRESTOR) 5 MG tablet   spironolactone  (ALDACTONE ) 25 MG tablet   valsartan -hydrochlorothiazide  (DIOVAN -HCT) 320-25 MG per tablet   No current facility-administered medications for this encounter.    Harlene Hoots Ward, PA-C [facility] [phone]

## 2024-03-03 NOTE — Anesthesia Preprocedure Evaluation (Addendum)
 Anesthesia Evaluation  Patient identified by MRN, date of birth, ID band Patient awake    Reviewed: Allergy & Precautions, NPO status , Patient's Chart, lab work & pertinent test results  Airway Mallampati: II  TM Distance: >3 FB Neck ROM: Full    Dental no notable dental hx. (+) Teeth Intact, Dental Advisory Given   Pulmonary asthma , sleep apnea and Continuous Positive Airway Pressure Ventilation    Pulmonary exam normal breath sounds clear to auscultation       Cardiovascular hypertension, + Peripheral Vascular Disease (4.7 cm ascending thoracic aortic aneurysm on recent CTA.) and +CHF  Normal cardiovascular exam+ dysrhythmias (RBBB)  Rhythm:Regular Rate:Normal  2023 TTE  1. Left ventricular ejection fraction, by estimation, is 55 to 60%. The  left ventricle has normal function. The left ventricle has no regional  wall motion abnormalities. The left ventricular internal cavity size was  mildly dilated. There is mild left  ventricular hypertrophy. Left ventricular diastolic parameters are  consistent with Grade I diastolic dysfunction (impaired relaxation).   2. Right ventricular systolic function is normal. The right ventricular  size is normal. Tricuspid regurgitation signal is inadequate for assessing  PA pressure.   3. The mitral valve is normal in structure. Trivial mitral valve  regurgitation.   4. The aortic valve is tricuspid. Aortic valve regurgitation is trivial.  No aortic stenosis is present.   5. The inferior vena cava is dilated in size with >50% respiratory  variability, suggesting right atrial pressure of 8 mmHg.   6. Aortic dilatation noted. Aneurysm of the ascending aorta, measuring 49  mm. Aneurysm of the aortic root, measuring 45 mm. Aneurysm of aortic arch  measuring 46mm.     Neuro/Psych    GI/Hepatic   Endo/Other  diabetes, Type 2  Class 3 obesity  Renal/GU      Musculoskeletal  (+)  Arthritis ,    Abdominal   Peds  Hematology   Anesthesia Other Findings All: Quinolones, Micardis, Linaclotide  Reproductive/Obstetrics                              Anesthesia Physical Anesthesia Plan  ASA: 3  Anesthesia Plan: General and Regional   Post-op Pain Management: Regional block* and Minimal or no pain anticipated   Induction: Intravenous  PONV Risk Score and Plan: Treatment may vary due to age or medical condition, Ondansetron  and Dexamethasone   Airway Management Planned: Oral ETT  Additional Equipment: None  Intra-op Plan:   Post-operative Plan: Extubation in OR  Informed Consent: I have reviewed the patients History and Physical, chart, labs and discussed the procedure including the risks, benefits and alternatives for the proposed anesthesia with the patient or authorized representative who has indicated his/her understanding and acceptance.     Dental advisory given  Plan Discussed with:   Anesthesia Plan Comments: (See PAT note 03/02/2024  GA w L ISB w exparel )         Anesthesia Quick Evaluation

## 2024-03-12 ENCOUNTER — Ambulatory Visit (HOSPITAL_COMMUNITY)
Admission: RE | Admit: 2024-03-12 | Discharge: 2024-03-12 | Disposition: A | Discharge status: Home / Self Care | Source: Ambulatory Visit | Attending: Orthopedic Surgery | Admitting: Orthopedic Surgery

## 2024-03-12 ENCOUNTER — Ambulatory Visit (HOSPITAL_COMMUNITY): Payer: Self-pay | Admitting: Physician Assistant

## 2024-03-12 ENCOUNTER — Ambulatory Visit (HOSPITAL_COMMUNITY): Payer: Self-pay | Admitting: Anesthesiology

## 2024-03-12 ENCOUNTER — Encounter (HOSPITAL_COMMUNITY): Payer: Self-pay | Admitting: Orthopedic Surgery

## 2024-03-12 ENCOUNTER — Encounter (HOSPITAL_COMMUNITY)
Admission: RE | Disposition: A | Discharge status: Home / Self Care | Payer: Self-pay | Source: Ambulatory Visit | Attending: Orthopedic Surgery

## 2024-03-12 DIAGNOSIS — E119 Type 2 diabetes mellitus without complications: Secondary | ICD-10-CM

## 2024-03-12 DIAGNOSIS — Z7984 Long term (current) use of oral hypoglycemic drugs: Secondary | ICD-10-CM | POA: Insufficient documentation

## 2024-03-12 DIAGNOSIS — Z79899 Other long term (current) drug therapy: Secondary | ICD-10-CM | POA: Insufficient documentation

## 2024-03-12 DIAGNOSIS — M19012 Primary osteoarthritis, left shoulder: Secondary | ICD-10-CM | POA: Diagnosis present

## 2024-03-12 DIAGNOSIS — Z6841 Body Mass Index (BMI) 40.0 and over, adult: Secondary | ICD-10-CM | POA: Insufficient documentation

## 2024-03-12 DIAGNOSIS — M75102 Unspecified rotator cuff tear or rupture of left shoulder, not specified as traumatic: Secondary | ICD-10-CM | POA: Diagnosis not present

## 2024-03-12 DIAGNOSIS — M12812 Other specific arthropathies, not elsewhere classified, left shoulder: Secondary | ICD-10-CM | POA: Diagnosis present

## 2024-03-12 DIAGNOSIS — Z7985 Long-term (current) use of injectable non-insulin antidiabetic drugs: Secondary | ICD-10-CM | POA: Diagnosis not present

## 2024-03-12 DIAGNOSIS — I4510 Unspecified right bundle-branch block: Secondary | ICD-10-CM | POA: Insufficient documentation

## 2024-03-12 DIAGNOSIS — Z791 Long term (current) use of non-steroidal anti-inflammatories (NSAID): Secondary | ICD-10-CM | POA: Diagnosis not present

## 2024-03-12 DIAGNOSIS — I5032 Chronic diastolic (congestive) heart failure: Secondary | ICD-10-CM | POA: Diagnosis not present

## 2024-03-12 DIAGNOSIS — G8918 Other acute postprocedural pain: Secondary | ICD-10-CM | POA: Diagnosis not present

## 2024-03-12 DIAGNOSIS — E66813 Obesity, class 3: Secondary | ICD-10-CM | POA: Insufficient documentation

## 2024-03-12 DIAGNOSIS — Z7982 Long term (current) use of aspirin: Secondary | ICD-10-CM | POA: Diagnosis not present

## 2024-03-12 DIAGNOSIS — G4733 Obstructive sleep apnea (adult) (pediatric): Secondary | ICD-10-CM | POA: Insufficient documentation

## 2024-03-12 DIAGNOSIS — E1151 Type 2 diabetes mellitus with diabetic peripheral angiopathy without gangrene: Secondary | ICD-10-CM | POA: Diagnosis not present

## 2024-03-12 DIAGNOSIS — I110 Hypertensive heart disease with heart failure: Secondary | ICD-10-CM | POA: Diagnosis not present

## 2024-03-12 DIAGNOSIS — J45909 Unspecified asthma, uncomplicated: Secondary | ICD-10-CM | POA: Insufficient documentation

## 2024-03-12 HISTORY — PX: REVERSE SHOULDER ARTHROPLASTY: SHX5054

## 2024-03-12 LAB — GLUCOSE, CAPILLARY: Glucose-Capillary: 191 mg/dL — ABNORMAL HIGH (ref 70–99)

## 2024-03-12 SURGERY — ARTHROPLASTY, SHOULDER, TOTAL, REVERSE
Anesthesia: Regional | Site: Shoulder | Laterality: Left

## 2024-03-12 MED ORDER — EPHEDRINE SULFATE-NACL 50-0.9 MG/10ML-% IV SOSY
PREFILLED_SYRINGE | INTRAVENOUS | Status: DC | PRN
Start: 2024-03-12 — End: 2024-03-12
  Administered 2024-03-12: 10 mg via INTRAVENOUS

## 2024-03-12 MED ORDER — OXYCODONE-ACETAMINOPHEN 5-325 MG PO TABS: 1.0000 | ORAL_TABLET | ORAL | 0 refills | Status: AC | PRN

## 2024-03-12 MED ORDER — ONDANSETRON HCL 4 MG/2ML IJ SOLN
INTRAMUSCULAR | Status: DC | PRN
  Administered 2024-03-12: 4 mg via INTRAVENOUS

## 2024-03-12 MED ORDER — FENTANYL CITRATE (PF) 50 MCG/ML IJ SOSY: 25.0000 ug | PREFILLED_SYRINGE | INTRAMUSCULAR | Status: DC | PRN

## 2024-03-12 MED ORDER — ALBUMIN HUMAN 5 % IV SOLN
INTRAVENOUS | Status: AC
  Filled 2024-03-12: qty 250

## 2024-03-12 MED ORDER — SUGAMMADEX SODIUM 200 MG/2ML IV SOLN
INTRAVENOUS | Status: DC | PRN
  Administered 2024-03-12: 300 mg via INTRAVENOUS

## 2024-03-12 MED ORDER — LIDOCAINE HCL (CARDIAC) PF 100 MG/5ML IV SOSY
PREFILLED_SYRINGE | INTRAVENOUS | Status: DC | PRN
  Administered 2024-03-12: 60 mg via INTRAVENOUS

## 2024-03-12 MED ORDER — DEXMEDETOMIDINE HCL IN NACL 80 MCG/20ML IV SOLN
INTRAVENOUS | Status: DC | PRN
Start: 2024-03-12 — End: 2024-03-12
  Administered 2024-03-12: 8 ug via INTRAVENOUS

## 2024-03-12 MED ORDER — INSULIN ASPART 100 UNIT/ML IJ SOLN: 0.0000 [IU] | INTRAMUSCULAR | Status: DC | PRN

## 2024-03-12 MED ORDER — ONDANSETRON HCL 4 MG/2ML IJ SOLN: 4.0000 mg | Freq: Once | INTRAMUSCULAR | Status: DC | PRN

## 2024-03-12 MED ORDER — PROPOFOL 10 MG/ML IV BOLUS
INTRAVENOUS | Status: DC | PRN
  Administered 2024-03-12: 150 mg via INTRAVENOUS

## 2024-03-12 MED ORDER — ACETAMINOPHEN 10 MG/ML IV SOLN
1000.0000 mg | Freq: Once | INTRAVENOUS | Status: DC | PRN
Start: 2024-03-12 — End: 2024-03-12

## 2024-03-12 MED ORDER — FENTANYL CITRATE (PF) 100 MCG/2ML IJ SOLN
INTRAMUSCULAR | Status: AC
  Filled 2024-03-12: qty 2

## 2024-03-12 MED ORDER — LACTATED RINGERS IV SOLN: INTRAVENOUS | Status: DC

## 2024-03-12 MED ORDER — ALBUMIN HUMAN 5 % IV SOLN: INTRAVENOUS | Status: DC | PRN

## 2024-03-12 MED ORDER — PROPOFOL 10 MG/ML IV BOLUS
INTRAVENOUS | Status: AC
  Filled 2024-03-12: qty 20

## 2024-03-12 MED ORDER — 0.9 % SODIUM CHLORIDE (POUR BTL) OPTIME
TOPICAL | Status: DC | PRN
  Administered 2024-03-12: 1000 mL

## 2024-03-12 MED ORDER — ONDANSETRON HCL 4 MG PO TABS: 4.0000 mg | ORAL_TABLET | Freq: Three times a day (TID) | ORAL | 0 refills | Status: AC | PRN

## 2024-03-12 MED ORDER — VASOPRESSIN 20 UNIT/ML IV SOLN
INTRAVENOUS | Status: AC
Start: 2024-03-12 — End: 2024-03-12
  Filled 2024-03-12: qty 1

## 2024-03-12 MED ORDER — BUPIVACAINE HCL (PF) 0.5 % IJ SOLN
INTRAMUSCULAR | Status: DC | PRN
  Administered 2024-03-12: 12 mL via PERINEURAL

## 2024-03-12 MED ORDER — CYCLOBENZAPRINE HCL 10 MG PO TABS: 5.0000 mg | ORAL_TABLET | Freq: Three times a day (TID) | ORAL | 1 refills | Status: AC | PRN

## 2024-03-12 MED ORDER — FENTANYL CITRATE (PF) 100 MCG/2ML IJ SOLN
INTRAMUSCULAR | Status: DC | PRN
  Administered 2024-03-12: 50 ug via INTRAVENOUS

## 2024-03-12 MED ORDER — PHENYLEPHRINE HCL-NACL 20-0.9 MG/250ML-% IV SOLN
INTRAVENOUS | Status: DC | PRN
  Administered 2024-03-12: 35 ug/min via INTRAVENOUS

## 2024-03-12 MED ORDER — SUCCINYLCHOLINE CHLORIDE 200 MG/10ML IV SOSY
PREFILLED_SYRINGE | INTRAVENOUS | Status: DC | PRN
  Administered 2024-03-12: 150 mg via INTRAVENOUS

## 2024-03-12 MED ORDER — CHLORHEXIDINE GLUCONATE 0.12 % MT SOLN
15.0000 mL | Freq: Once | OROMUCOSAL | Status: AC
  Administered 2024-03-12: 15 mL via OROMUCOSAL

## 2024-03-12 MED ORDER — DEXAMETHASONE SODIUM PHOSPHATE 4 MG/ML IJ SOLN
INTRAMUSCULAR | Status: DC | PRN
Start: 2024-03-12 — End: 2024-03-12
  Administered 2024-03-12: 8 mg via INTRAVENOUS

## 2024-03-12 MED ORDER — ONDANSETRON HCL 4 MG/2ML IJ SOLN
INTRAMUSCULAR | Status: AC
  Filled 2024-03-12: qty 2

## 2024-03-12 MED ORDER — CEFAZOLIN SODIUM-DEXTROSE 3-4 GM/150ML-% IV SOLN
3.0000 g | INTRAVENOUS | Status: AC
  Administered 2024-03-12: 3 g via INTRAVENOUS
  Filled 2024-03-12: qty 150

## 2024-03-12 MED ORDER — TRANEXAMIC ACID-NACL 1000-0.7 MG/100ML-% IV SOLN
1000.0000 mg | INTRAVENOUS | Status: AC
  Administered 2024-03-12: 1000 mg via INTRAVENOUS
  Filled 2024-03-12: qty 100

## 2024-03-12 MED ORDER — VANCOMYCIN HCL 1000 MG IV SOLR
INTRAVENOUS | Status: AC
  Filled 2024-03-12: qty 20

## 2024-03-12 MED ORDER — LACTATED RINGERS IV SOLN: INTRAVENOUS | Status: DC | PRN

## 2024-03-12 MED ORDER — ROCURONIUM BROMIDE 100 MG/10ML IV SOLN
INTRAVENOUS | Status: DC | PRN
  Administered 2024-03-12: 20 mg via INTRAVENOUS
  Administered 2024-03-12: 50 mg via INTRAVENOUS
  Administered 2024-03-12: 30 mg via INTRAVENOUS

## 2024-03-12 MED ORDER — ORAL CARE MOUTH RINSE: 15.0000 mL | Freq: Once | OROMUCOSAL | Status: AC

## 2024-03-12 MED ORDER — BUPIVACAINE LIPOSOME 1.3 % IJ SUSP
INTRAMUSCULAR | Status: DC | PRN
  Administered 2024-03-12: 10 mL via PERINEURAL

## 2024-03-12 MED ORDER — STERILE WATER FOR IRRIGATION IR SOLN
Status: DC | PRN
  Administered 2024-03-12: 2000 mL

## 2024-03-12 MED ORDER — TRANEXAMIC ACID 1000 MG/10ML IV SOLN: 1000.0000 mg | INTRAVENOUS | Status: DC

## 2024-03-12 MED ORDER — VASOPRESSIN 20 UNIT/ML IV SOLN
INTRAVENOUS | Status: DC | PRN
  Administered 2024-03-12 (×4): 1 [IU] via INTRAVENOUS

## 2024-03-12 SURGICAL SUPPLY — 59 items
BAG COUNTER SPONGE SURGICOUNT (BAG) IMPLANT
BAG ZIPLOCK 12X15 (MISCELLANEOUS) ×1 IMPLANT
BIT DRILL AR 3 NS (BIT) IMPLANT
BLADE SAW SGTL 83.5X18.5 (BLADE) ×1 IMPLANT
BNDG COHESIVE 4X5 TAN STRL LF (GAUZE/BANDAGES/DRESSINGS) ×1 IMPLANT
COOLER ICEMAN CLASSIC (MISCELLANEOUS) ×1 IMPLANT
COVER BACK TABLE 60X90IN (DRAPES) ×1 IMPLANT
COVER SURGICAL LIGHT HANDLE (MISCELLANEOUS) ×1 IMPLANT
CUP SUT UNIV REVERS 42 NEUT (Shoulder) IMPLANT
DRAPE SHEET LG 3/4 BI-LAMINATE (DRAPES) ×1 IMPLANT
DRAPE SURG 17X11 SM STRL (DRAPES) ×1 IMPLANT
DRAPE SURG ORHT 6 SPLT 77X108 (DRAPES) ×2 IMPLANT
DRAPE TOP 10253 STERILE (DRAPES) ×1 IMPLANT
DRAPE U-SHAPE 47X51 STRL (DRAPES) ×1 IMPLANT
DRESSING AQUACEL AG SP 3.5X6 (GAUZE/BANDAGES/DRESSINGS) ×1 IMPLANT
DRSG AQUACEL AG ADV 3.5X10 (GAUZE/BANDAGES/DRESSINGS) IMPLANT
DURAPREP 26ML APPLICATOR (WOUND CARE) ×1 IMPLANT
ELECT BLADE TIP CTD 4 INCH (ELECTRODE) ×1 IMPLANT
ELECT PENCIL ROCKER SW 15FT (MISCELLANEOUS) ×1 IMPLANT
ELECT REM PT RETURN 15FT ADLT (MISCELLANEOUS) ×1 IMPLANT
FACESHIELD WRAPAROUND OR TEAM (MASK) ×5 IMPLANT
GLENOID SYS 42 +4 LAT/24 SHLDR (Miscellaneous) IMPLANT
GLENOID UNI REV MOD 24 +2 LAT (Joint) IMPLANT
GLOVE BIO SURGEON STRL SZ7.5 (GLOVE) ×1 IMPLANT
GLOVE BIO SURGEON STRL SZ8 (GLOVE) ×1 IMPLANT
GLOVE SS BIOGEL STRL SZ 7 (GLOVE) ×1 IMPLANT
GLOVE SS BIOGEL STRL SZ 7.5 (GLOVE) ×1 IMPLANT
GOWN STRL SURGICAL XL XLNG (GOWN DISPOSABLE) ×2 IMPLANT
INSERT HUM CONST L/42 +3 (Insert) IMPLANT
KIT BASIN OR (CUSTOM PROCEDURE TRAY) ×1 IMPLANT
KIT TURNOVER KIT A (KITS) ×1 IMPLANT
MANIFOLD NEPTUNE II (INSTRUMENTS) ×1 IMPLANT
NDL TAPERED W/ NITINOL LOOP (MISCELLANEOUS) ×1 IMPLANT
NEEDLE TAPERED W/ NITINOL LOOP (MISCELLANEOUS) ×1 IMPLANT
NS IRRIG 1000ML POUR BTL (IV SOLUTION) ×1 IMPLANT
PACK SHOULDER (CUSTOM PROCEDURE TRAY) ×1 IMPLANT
PAD ARMBOARD POSITIONER FOAM (MISCELLANEOUS) ×1 IMPLANT
PAD COLD SHLDR WRAP-ON (PAD) ×1 IMPLANT
PIN NITINOL TARGETER 2.8 (PIN) IMPLANT
PIN SET MODULAR GLENOID SYSTEM (PIN) IMPLANT
RESTRAINT HEAD UNIVERSAL NS (MISCELLANEOUS) ×1 IMPLANT
SCREW CENTRAL MOD 30MM (Screw) IMPLANT
SCREW PERI LOCK 5.5X32 (Screw) IMPLANT
SCREW PERIPHERAL 5.5X20 LOCK (Screw) IMPLANT
SLING ARM FOAM STRAP LRG (SOFTGOODS) IMPLANT
SLING ARM FOAM STRAP MED (SOFTGOODS) IMPLANT
SLING ARM FOAM STRAP XLG (SOFTGOODS) IMPLANT
SPACER SHLD UNI REV 42 +6 (Shoulder) IMPLANT
STEM HUM UNIV REV SZ 12 (Stem) IMPLANT
STRIP CLOSURE SKIN 1/2X4 (GAUZE/BANDAGES/DRESSINGS) ×1 IMPLANT
SUT MNCRL AB 3-0 PS2 18 (SUTURE) ×1 IMPLANT
SUT MON AB 2-0 CT1 36 (SUTURE) ×1 IMPLANT
SUT VIC AB 1 CT1 36 (SUTURE) ×1 IMPLANT
SUTURE TAPE 1.3 40 TPR END (SUTURE) ×2 IMPLANT
TOWEL GREEN STERILE FF (TOWEL DISPOSABLE) ×1 IMPLANT
TOWEL OR 17X26 10 PK STRL BLUE (TOWEL DISPOSABLE) ×1 IMPLANT
TUBE SUCTION HIGH CAP CLEAR NV (SUCTIONS) ×1 IMPLANT
TUBING CONNECTING 10 (TUBING) ×1 IMPLANT
WATER STERILE IRR 1000ML POUR (IV SOLUTION) ×2 IMPLANT

## 2024-03-12 NOTE — Evaluation (Addendum)
 Occupational Therapy Evaluation Patient Details Name: Tommy [name] MRN: [id number] DOB: 09-01-1945 Today's Date: 03/12/2024   History of Present Illness   The patient is a 78 y.o. male with chronic and progressive increasing left shoulder pain related to severe glenohumeral arthritis and associated rotator cuff dysfunction with profound loss of motion.  Due to increasing functional limitations and failure to respond to prolonged attempts of conservative management, he is brought to the operating room today for planned left shoulder reverse arthroplasty     Clinical Impressions OT eval initiated and completed. PTA pt lives at home with his wife and was Ind with ADLs, IADLs, home mgt, drives and used no AD for walking. Pt currently requires min A with ADLs/selfcare tasks with L UE NWB and in sling. Pt and family educated on sling wear, position for sleep, compensatory ADL techniques, ROM of L hand/wrist/elbow; per ortho surgeon: If sitting in controlled environment, ok to come out of sling to give neck a break. Please sleep in it to protect until follow up in MD office. OK to use operative arm for feeding, hygiene and ADLs. Ok for Pendulums and lap slides as exercises. Ok to use operative arm within the following parameters for ADL purposes, Ok for PROM, AAROM, AROM within pain tolerance and within the following ROM ER 20 ABD 45 FE 60; all handouts provided. Pt and family verbalize and demo understanding. All education completed and no further acute OT services are indicated at this time.      If plan is discharge home, recommend the following:   A little help with bathing/dressing/bathroom;Assist for transportation     Functional Status Assessment   Patient has had a recent decline in their functional status and demonstrates the ability to make significant improvements in function in a reasonable and predictable amount of time.     Equipment Recommendations   None recommended by  OT     Recommendations for Other Services         Precautions/Restrictions   Precautions Precautions: Shoulder Shoulder Interventions: Shoulder sling/immobilizer;Off for dressing/bathing/exercises Precaution Booklet Issued: Yes (comment) Recall of Precautions/Restrictions: Intact Precaution/Restrictions Comments: per ortho surgeon: If sitting in controlled environment, ok to come out of sling to give neck a break. Please sleep in it to protect until follow up in MD office. OK to use operative arm for feeding, hygiene and ADLs. Ok for Pendulums and lap slides as exercises. Ok to use operative arm within the following parameters for ADL purposes, Ok for PROM, AAROM, AROM within pain tolerance and within the following ROM ER 20 ABD 45 FE 60; all handouts provided Required Braces or Orthoses: Sling Restrictions Weight Bearing Restrictions Per Provider Order: Yes LUE Weight Bearing Per Provider Order: Non weight bearing     Mobility Bed Mobility               General bed mobility comments: pt in chair in PACU    Transfers Overall transfer level: Modified independent                 General transfer comment: slow to initiate, no physical assist or AD needed      Balance Overall balance assessment: Modified Independent                                         ADL either performed or assessed with clinical judgement   ADL Overall  ADL's : Needs assistance/impaired Eating/Feeding: Set up;Independent   Grooming: Wash/dry hands;Wash/dry face;Set up   Upper Body Bathing: Minimal assistance   Lower Body Bathing: Minimal assistance   Upper Body Dressing : Minimal assistance   Lower Body Dressing: Minimal assistance   Toilet Transfer: Independent   Toileting- Clothing Manipulation and Hygiene: Modified independent       Functional mobility during ADLs: Independent General ADL Comments: pt and his son educated on compensatory ADL techniques,  positioning for sleep     Vision Baseline Vision/History: 1 Wears glasses Ability to See in Adequate Light: 0 Adequate Patient Visual Report: No change from baseline       Perception         Praxis         Pertinent Vitals/Pain Pain Assessment Pain Assessment: No/denies pain     Extremity/Trunk Assessment Upper Extremity Assessment Upper Extremity Assessment: Right hand dominant;LUE deficits/detail LUE Deficits / Details: NWB, sling LUE: Unable to fully assess due to pain           Communication Communication Communication: No apparent difficulties   Cognition Arousal: Alert Behavior During Therapy: WFL for tasks assessed/performed Cognition: No apparent impairments                               Following commands: Intact       Cueing  General Comments          Exercises     Shoulder Instructions Shoulder Instructions Donning/doffing shirt without moving shoulder: Minimal assistance;Caregiver independent with task Method for sponge bathing under operated UE: Minimal assistance;Caregiver independent with task Donning/doffing sling/immobilizer: Minimal assistance;Caregiver independent with task Pendulum exercises (written home exercise program): Independent ROM for elbow, wrist and digits of operated UE: Supervision/safety;Caregiver independent with task Sling wearing schedule (on at all times/off for ADL's): Independent;Caregiver independent with task Positioning of UE while sleeping: Independent;Caregiver independent with task    Home Living Family/patient expects to be discharged to:: Private residence Living Arrangements: Spouse/significant other Available Help at Discharge: Family Type of Home: House Home Access: Stairs to enter     Home Layout: One level     Bathroom Shower/Tub: Producer, television/film/video: Handicapped height     Home Equipment: BSC/3in1;Cane - single Librarian, academic (2 wheels)           Prior Functioning/Environment Prior Level of Function : Independent/Modified Independent;Driving             Mobility Comments: no AD needed for walking ADLs Comments: Ind with ADLs/selfcare, IADLs    OT Problem List: Decreased strength;Decreased range of motion;Impaired UE functional use   OT Treatment/Interventions:        OT Goals(Current goals can be found in the care plan section)   Acute Rehab OT Goals Patient Stated Goal: go home OT Goal Formulation: All assessment and education complete, DC therapy   OT Frequency:       Co-evaluation              AM-PAC OT 6 Clicks Daily Activity     Outcome Measure Help from another person eating meals?: None Help from another person taking care of personal grooming?: None Help from another person toileting, which includes using toliet, bedpan, or urinal?: A Little Help from another person bathing (including washing, rinsing, drying)?: A Little Help from another person to put on and taking off regular upper body clothing?: A Little Help from another person  to put on and taking off regular lower body clothing?: A Little 6 Click Score: 20   End of Session Equipment Utilized During Treatment: Other (comment) (L UE sling)  Activity Tolerance: Patient tolerated treatment well Patient left: in chair;with call bell/phone within reach;with family/visitor present  OT Visit Diagnosis: Muscle weakness (generalized) (M62.81)                Time: 8767-8688 OT Time Calculation (min): 39 min Charges:  OT General Charges $OT Visit: 1 Visit OT Evaluation $OT Eval Low Complexity: 1 Low OT Treatments $Self Care/Home Management : 8-22 mins $Therapeutic Activity: 8-22 mins   Jacques Karna Loose 03/12/2024, 1:36 PM

## 2024-03-12 NOTE — Care Plan (Signed)
 Ortho Bundle Case Management Note  Patient Details  Name: Tommy [name] MRN: [id number] Date of Birth: 03-12-1946  Lt Reverse Shoulder Arthroplasty on 03/12/24  DCP: Home with wife DME: No needs PT: EO                   DME Arranged:  N/A DME Agency:  NA  HH Arranged:    HH Agency:     Additional Comments: Please contact me with any questions of if this plan should need to change.  Burnard Dross, Case Manager [facility]  [phone]  Ext. [phone]   03/12/2024, 8:10 AM

## 2024-03-12 NOTE — Op Note (Signed)
 03/12/2024  9:44 AM  PATIENT:   Tommy [name]  78 y.o. male  PRE-OPERATIVE DIAGNOSIS: Left shoulder advanced osteoarthritis with severe chronic rotator cuff dysfunction  POST-OPERATIVE DIAGNOSIS: Same  PROCEDURE: Left shoulder reverse arthroplasty lysing a press-fit size 12 Arthrex stem, neutral metathesis, +6 spacer, +3 constrained polyethylene insert, 42/+4 glenosphere on a small/+2 baseplate  SURGEON:  Garan Frappier, Franky BATTLE M.D.  ASSISTANTS: Randine Ricks, PA-C  Randine Ricks, PA-C was utilized as an Geophysicist/field seismologist throughout this case, essential for help with positioning the patient, positioning extremity, tissue manipulation, implantation of the prosthesis, suture management, wound closure, and intraoperative decision-making.  ANESTHESIA:   General Endotracheal and interscalene block with Exparel   EBL: 300 cc  SPECIMEN: None  Drains: None   PATIENT DISPOSITION:  PACU - hemodynamically stable.    PLAN OF CARE: Discharge to home after PACU  Brief history:  Tommy [name] is a 78 year old gentleman with a long history of progressive increasing left shoulder pain related to severe osteoarthritis and associated rotator cuff dysfunction.  Due to his increasing pain and limitations and failure to respond to prolonged attempts at conservative management.  He is brought to the operating today for planned left shoulder reverse arthroplasty.  Preoperatively, I counseled the patient regarding treatment options and risks versus benefits thereof.  Possible surgical complications were all reviewed including potential for bleeding, infection, neurovascular injury, persistent pain, loss of motion, anesthetic complication, failure of the implant, and possible need for additional surgery. They understand and accept and agrees with our planned procedure.   Procedure in detail:  After undergoing routine preop evaluation the patient received prophylactic antibiotics and interscalene block with  Exparel  was established in the holding area by the anesthesia department.  Subsequently placed spine on the operating table and underwent the smooth induction of a general endotracheal anesthesia.  Placed in the beachchair position and appropriately padded and protected.  Left shoulder girdle region was sterilely prepped and draped in standard fashion.  Timeout was called.  A deltopectoral approach left shoulder was made for an approximately 12 cm incision.  Skin flaps elevated dissection carried deeply and deltopectoral interval was then developed from proximal to this with the vein taken laterally.  The conjoined tendon was mobilized and retracted medially and adhesions were divided beneath the deltoid.  The long head biceps tendon was then tenodesed at the upper border of the pectoralis major tendon with the proximal segment unroofed and excised.  The subscapularis was then separated from the lesser tuberosity and tagged with a pair of grasping suture tape sutures.  Superior rotator cuff was then split to the base of the coracoid and capsular attachments were then divided from the anterior inferior margin of the humeral neck allowing delivery of the humeral head through the wound.  An extra medullary guide was then used to outline the proposed humeral head resection which was performed at approximately 20 degrees of retroversion with an oscillating saw and marginally osteophytes were then removed with a rondure.  A metal cap was then placed over the cut proximal humeral surface and we then exposed the glenoid.  A circumferential labral resection was performed.  Of note there was severe proliferative synovitis and the tissues were all extremely friable associated with chronic inflammation.  Once complete exposure was obtained a guidepin was then directed into the center of the glenoid and the glenoid was then prepared with the central followed by the peripheral reamer to a stable subchondral bony bed in  preparation completed with a drill  and tapped for a 30 mm lag screw.  Our baseplate was then assembled.  There was a relatively large central glenoid cyst that I curetted and packed with bone graft harvested from the resected humeral head and then the baseplate was inserted with excellent fixation.  The peripheral locking screws were all then placed using standard technique with excellent fixation.  A 42/+4 glenosphere was then impacted onto the baseplate and a central locking screw was placed.  We returned our attention back to the proximal humerus where the canal was opened and we ultimately broached up to a size 12 short stem at approximately 20 degrees of retroversion.  A neutral metaphyseal reaming guide was then used to repair the metaphysis.  A trial implant was then placed in a trial implant showed good motion stability and soft tissue balance.  This point the trial was removed.  A final implant was assembled.  The canal was irrigated cleaned and dried with vancomycin powder applied in the canal.  Final implant was seated with excellent fixation.  We then performed a series of trial reductions and ultimately felt that a +6 spacer with a +3 poly gave us  the best motion stability and soft tissue balance.  The final implants were assembled and a final +3 constrained poly was impacted onto the stem and a final reduction showed excellent motion ability and soft tissue balance all much to our satisfaction.  The joint was then copiously irrigated cleaned and dried.  The subscapularis was then mobilized and confirmed to have good elasticity for repair and is repaired back to the eyelets on the collar of the implant using the previously placed suture tape sutures.  The balance of the vancomycin powder was then sprayed liberally throughout the deep soft tissue planes.  The deltopectoral interval was reapproximated with a series of figure-of-eight number Vicryl sutures within it should note that there was compromise of  the cephalic vein which required ligating in its midpoint.  2-0 Monocryl used to close the subcu layer and intracuticular 3-0 Monocryl used to close the skin followed by Steri-Strips and an Aquacel dressing.  The left arm was placed into a sling.  The patient was awakened, extubated, and taken to the recovery room in stable condition.  Franky CHRISTELLA Pointer MD   Contact # [phone]

## 2024-03-12 NOTE — Anesthesia Postprocedure Evaluation (Signed)
 Anesthesia Post Note  Patient: Tommy [name]  Procedure(s) Performed: ARTHROPLASTY, SHOULDER, TOTAL, REVERSE (Left: Shoulder)     Patient location during evaluation: PACU Anesthesia Type: Regional and General Level of consciousness: awake and alert Pain management: pain level controlled Vital Signs Assessment: post-procedure vital signs reviewed and stable Respiratory status: spontaneous breathing, nonlabored ventilation, respiratory function stable and patient connected to nasal cannula oxygen  Cardiovascular status: blood pressure returned to baseline and stable Postop Assessment: no apparent nausea or vomiting Anesthetic complications: no   No notable events documented.  Last Vitals:  Vitals:   03/12/24 1030 03/12/24 1045  BP: (!) 98/55 (!) 104/53  Pulse: 69 67  Resp: 18 18  Temp:    SpO2: 91% 91%    Last Pain:  Vitals:   03/12/24 1045  TempSrc:   PainSc: 0-No pain                 Tommy [name]

## 2024-03-12 NOTE — Transfer of Care (Signed)
 Immediate Anesthesia Transfer of Care Note  Patient: Tommy [name]  Procedure(s) Performed: ARTHROPLASTY, SHOULDER, TOTAL, REVERSE (Left: Shoulder)  Patient Location: PACU  Anesthesia Type:General and Regional  Level of Consciousness: awake and confused  Airway & Oxygen  Therapy: Patient Spontanous Breathing and Patient connected to face mask oxygen   Post-op Assessment: Report given to RN and Post -op Vital signs reviewed and stable  Post vital signs: Reviewed and stable  Last Vitals:  Vitals Value Taken Time  BP 106/67 03/12/24 09:56  Temp    Pulse 70 03/12/24 09:57  Resp 17 03/12/24 09:58  SpO2 88 % 03/12/24 09:57  Vitals shown include unfiled device data.  Last Pain:  Vitals:   03/12/24 0646  TempSrc: Oral  PainSc:          Complications: No notable events documented.

## 2024-03-12 NOTE — Discharge Instructions (Signed)

## 2024-03-12 NOTE — Anesthesia Procedure Notes (Addendum)
 Anesthesia Regional Block: Interscalene brachial plexus block   Pre-Anesthetic Checklist: , timeout performed,  Correct Patient, Correct Site, Correct Laterality,  Correct Procedure, Correct Position, site marked,  Risks and benefits discussed,  Surgical consent,  Pre-op evaluation,  At surgeon's request and post-op pain management  Laterality: Left and Upper  Prep: Maximum Sterile Barrier Precautions used, chloraprep       Needles:  Injection technique: Single-shot  Needle Type: Echogenic Needle     Needle Length: 5cm  Needle Gauge: 21     Additional Needles:   Procedures:,,,, ultrasound used (permanent image in chart),,    Narrative:  Start time: 03/12/2024 7:09 AM End time: 03/12/2024 7:15 AM Injection made incrementally with aspirations every 5 mL.  Performed by: Personally  Anesthesiologist: Jefm Garnette LABOR, MD  Additional Notes: Block assessed prior to procedure. Patient tolerated procedure well.

## 2024-03-12 NOTE — H&P (Signed)
 Tommy [name]    Chief Complaint: rotator cuff tear arthropathy advanced osteoarthritis HPI: The patient is a 78 y.o. male with chronic and progressive increasing left shoulder pain related to severe glenohumeral arthritis and associated rotator cuff dysfunction with profound loss of motion.  Due to increasing functional limitations and failure to respond to prolonged attempts of conservative management, he is brought to the operating room today for planned left shoulder reverse arthroplasty  Past Medical History:  Diagnosis Date   Allergy    Anemia    as teenager   Arthritis    hands, hip   Ascending aortic aneurysm 04/23/2022   Back pain    BPPV (benign paroxysmal positional vertigo)    History of   Cancer (HCC)    melanoma removed from leg years ago, malignant mole   Cataract    Chronic back pain    Chronic diastolic heart failure (HCC) 04/23/2022   Complication of anesthesia    woke up and was confused and combative after back surgery   Constipation    DDD (degenerative disc disease), lumbar    Diabetes mellitus without complication (HCC)    Diabetic neuropathy (HCC)    ED (erectile dysfunction)    Essential hypertension 10/16/2015   Fatty liver    First degree AV block    EKG 12/03/16   GERD (gastroesophageal reflux disease)    diet controlled, no meds currently, history of   H/O hiatal hernia    History of elevated PSA    Nocturia more than twice per night 12/21/2013   OA (osteoarthritis)    Obesity hypoventilation syndrome (HCC) 04/07/2014   Obesity, morbid (HCC) 12/21/2013   OSA on CPAP 04/07/2014   PONV (postoperative nausea and vomiting)    Pure hypercholesterolemia 04/18/2021   RBBB    previous EKG   Sleep apnea    uses CPAP nightly   Snoring 12/21/2013   Spondylosis 2014   lumbar back, noted on MR    Ulcer    stomach ulcer as teenager      Past Surgical History:  Procedure Laterality Date   BACK SURGERY     done approx. 24 years ago    1998   CARDIOVASCULAR STRESS TEST     done approx 10 years ago, Dr. Onita   COLONOSCOPY  2006   hx of with removal of polyps/[facility]   COLONOSCOPY WITH PROPOFOL  N/A 09/06/2015   Procedure: COLONOSCOPY WITH PROPOFOL ;  Surgeon: Gwendlyn ONEIDA Buddy, MD;  Location: [facility];  Service: Endoscopy;  Laterality: N/A;   lipoma removed     from leg   LUMBAR LAMINECTOMY/DECOMPRESSION MICRODISCECTOMY  06/19/2012   Procedure: LUMBAR LAMINECTOMY/DECOMPRESSION MICRODISCECTOMY 1 LEVEL;  Surgeon: Donaciano Sprang, MD;  Location: [facility];  Service: Orthopedics;  Laterality: Left;  L2-3 LEFT MICRODISCECTOMY   thigh surgery     fatty deposit   TONSILLECTOMY     TOTAL HIP ARTHROPLASTY Right 06/05/2017   Procedure: RIGHT TOTAL HIP ARTHROPLASTY ANTERIOR APPROACH;  Surgeon: Melodi Lerner, MD;  Location: [facility];  Service: Orthopedics;  Laterality: Right;  Dr. requested 120 minutes   WISDOM TOOTH EXTRACTION     x 1 tooth    Family History  Problem Relation Age of Onset   Dementia Mother    Colon polyps Mother    Heart failure Father    Breast cancer Sister    Heart attack Sister    Thyroid  disease Sister    Sleep apnea Brother    Colon cancer Neg Hx  Rectal cancer Neg Hx    Stomach cancer Neg Hx    Esophageal cancer Neg Hx     Social History:  reports that he has never smoked. He has never used smokeless tobacco. He reports that he does not drink alcohol  and does not use drugs.  BMI: Estimated body mass index is 44.33 kg/m as calculated from the following:   Height as of 03/02/24: 6' 1 (1.854 m).   Weight as of 03/02/24: 152.4 kg.  Lab Results  Component Value Date   ALBUMIN 4.2 05/30/2017   Diabetes:   Patient has a diagnosis of diabetes,  Lab Results  Component Value Date   HGBA1C 5.2 05/30/2017   Smoking Status:   reports that he has never smoked. He has never used smokeless tobacco.     Medications Prior to Admission  Medication Sig Dispense Refill   amLODipine  (NORVASC ) 10  MG tablet Take 10 mg by mouth every morning.      aspirin EC 81 MG tablet Take 81 mg by mouth daily.     cyclobenzaprine (FLEXERIL) 5 MG tablet Take 5 mg by mouth daily as needed for muscle spasms.     docusate sodium  (COLACE) 100 MG capsule Take 200 mg by mouth at bedtime.     doxazosin  (CARDURA ) 2 MG tablet Take 1 tablet (2 mg total) by mouth 2 (two) times daily. Take with 4 mg tablet to total 6 mg twice a day 180 tablet 3   doxazosin  (CARDURA ) 4 MG tablet Take 1 tablet (4 mg total) by mouth 2 (two) times daily. Take one tablet with your 2mg  tablet to make 6mg  90 tablet 3   FIBER PO Take 2 tablets by mouth every morning.     furosemide (LASIX) 40 MG tablet Take 40 mg by mouth once a week. Prn     GLUCOSAMINE-CHONDROITIN PO Take 2 tablets by mouth daily.     INVOKANA  100 MG TABS tablet Take 100 mg by mouth daily before breakfast.     ipratropium (ATROVENT ) 0.03 % nasal spray Place 1 spray into both nostrils 2 (two) times daily.     montelukast  (SINGULAIR ) 10 MG tablet Take 10 mg by mouth every morning.     MOUNJARO  15 MG/0.5ML Pen Inject 15 mg into the skin every Saturday.     Multiple Vitamin (MULTIVITAMIN) capsule Take 1 capsule by mouth daily.     naproxen (NAPROSYN) 500 MG tablet Take 500 mg by mouth 2 (two) times daily with a meal.     Nebivolol  HCl 20 MG TABS TAKE 1 TABLET (20 MG TOTAL) BY MOUTH DAILY. 90 tablet 2   NON FORMULARY Pt uses a cpap nightly     rosuvastatin (CRESTOR) 5 MG tablet Take 5 mg by mouth once a week.     spironolactone  (ALDACTONE ) 25 MG tablet Take 1 tablet (25 mg total) by mouth daily. 90 tablet 30   valsartan -hydrochlorothiazide  (DIOVAN -HCT) 320-25 MG per tablet Take 1 tablet by mouth every morning.     ACCU-CHEK AVIVA PLUS test strip      Accu-Chek Softclix Lancets lancets        Physical Exam: Left shoulder demonstrates painful and severely restricted motion as noted at his recent office visits.  He maintains relatively good strength to manual muscle testing.   With passive motion there for mechanical endpoints with pain.  Otherwise neurovascular intact in the left upper extremity.  Radiographs  Imaging studies confirm changes consistent with severe osteoarthritis to include complete obliteration of  joint space, subchondral sclerosis, and peripheral osteophyte formation.  Vitals     Assessment/Plan  Impression: rotator cuff tear arthropathy advanced osteoarthritis  Plan of Action: Procedure(s): ARTHROPLASTY, SHOULDER, TOTAL, REVERSE  Bridgitt Raggio M Emmely Bittinger 03/12/2024, 5:54 AM Contact # [phone]

## 2024-03-12 NOTE — Anesthesia Procedure Notes (Signed)
 Procedure Name: Intubation Date/Time: 03/12/2024 7:49 AM  Performed by: Dartha Meckel, CRNAPre-anesthesia Checklist: Patient identified, Emergency Drugs available, Suction available and Patient being monitored Patient Re-evaluated:Patient Re-evaluated prior to induction Oxygen  Delivery Method: Circle system utilized Preoxygenation: Pre-oxygenation with 100% oxygen  Induction Type: IV induction Ventilation: Mask ventilation without difficulty Laryngoscope Size: Glidescope and 4 Grade View: Grade I Tube type: Oral Tube size: 7.5 mm Number of attempts: 1 Airway Equipment and Method: Stylet and Oral airway Placement Confirmation: ETT inserted through vocal cords under direct vision, positive ETCO2 and breath sounds checked- equal and bilateral Secured at: 24 cm Tube secured with: Tape Dental Injury: Teeth and Oropharynx as per pre-operative assessment

## 2024-03-13 ENCOUNTER — Encounter (HOSPITAL_COMMUNITY): Payer: Self-pay | Admitting: Orthopedic Surgery

## 2024-03-18 DIAGNOSIS — L304 Erythema intertrigo: Secondary | ICD-10-CM | POA: Diagnosis not present

## 2024-03-18 DIAGNOSIS — L814 Other melanin hyperpigmentation: Secondary | ICD-10-CM | POA: Diagnosis not present

## 2024-03-18 DIAGNOSIS — L918 Other hypertrophic disorders of the skin: Secondary | ICD-10-CM | POA: Diagnosis not present

## 2024-03-18 DIAGNOSIS — D224 Melanocytic nevi of scalp and neck: Secondary | ICD-10-CM | POA: Diagnosis not present

## 2024-03-18 DIAGNOSIS — L738 Other specified follicular disorders: Secondary | ICD-10-CM | POA: Diagnosis not present

## 2024-03-18 DIAGNOSIS — D225 Melanocytic nevi of trunk: Secondary | ICD-10-CM | POA: Diagnosis not present

## 2024-03-18 DIAGNOSIS — L821 Other seborrheic keratosis: Secondary | ICD-10-CM | POA: Diagnosis not present

## 2024-03-18 DIAGNOSIS — Z85820 Personal history of malignant melanoma of skin: Secondary | ICD-10-CM | POA: Diagnosis not present

## 2024-03-18 DIAGNOSIS — L720 Epidermal cyst: Secondary | ICD-10-CM | POA: Diagnosis not present

## 2024-03-25 DIAGNOSIS — Z5189 Encounter for other specified aftercare: Secondary | ICD-10-CM | POA: Diagnosis not present

## 2024-03-25 DIAGNOSIS — M25511 Pain in right shoulder: Secondary | ICD-10-CM | POA: Diagnosis not present

## 2024-03-25 DIAGNOSIS — Z96612 Presence of left artificial shoulder joint: Secondary | ICD-10-CM | POA: Diagnosis not present

## 2024-03-28 DIAGNOSIS — G5601 Carpal tunnel syndrome, right upper limb: Secondary | ICD-10-CM | POA: Diagnosis not present

## 2024-04-01 DIAGNOSIS — J45998 Other asthma: Secondary | ICD-10-CM | POA: Diagnosis not present

## 2024-04-06 DIAGNOSIS — G5601 Carpal tunnel syndrome, right upper limb: Secondary | ICD-10-CM | POA: Diagnosis not present

## 2024-04-09 DIAGNOSIS — D3131 Benign neoplasm of right choroid: Secondary | ICD-10-CM | POA: Diagnosis not present

## 2024-04-09 DIAGNOSIS — E119 Type 2 diabetes mellitus without complications: Secondary | ICD-10-CM | POA: Diagnosis not present

## 2024-04-09 DIAGNOSIS — H5203 Hypermetropia, bilateral: Secondary | ICD-10-CM | POA: Diagnosis not present

## 2024-04-09 DIAGNOSIS — H52203 Unspecified astigmatism, bilateral: Secondary | ICD-10-CM | POA: Diagnosis not present

## 2024-04-13 ENCOUNTER — Other Ambulatory Visit (HOSPITAL_BASED_OUTPATIENT_CLINIC_OR_DEPARTMENT_OTHER): Payer: Self-pay | Admitting: Family

## 2024-04-13 DIAGNOSIS — I10 Essential (primary) hypertension: Secondary | ICD-10-CM

## 2024-04-14 DIAGNOSIS — R29898 Other symptoms and signs involving the musculoskeletal system: Secondary | ICD-10-CM | POA: Diagnosis not present

## 2024-04-14 DIAGNOSIS — M25612 Stiffness of left shoulder, not elsewhere classified: Secondary | ICD-10-CM | POA: Diagnosis not present

## 2024-04-14 DIAGNOSIS — M170 Bilateral primary osteoarthritis of knee: Secondary | ICD-10-CM | POA: Diagnosis not present

## 2024-04-21 DIAGNOSIS — M25612 Stiffness of left shoulder, not elsewhere classified: Secondary | ICD-10-CM | POA: Diagnosis not present

## 2024-04-21 DIAGNOSIS — M25512 Pain in left shoulder: Secondary | ICD-10-CM | POA: Diagnosis not present

## 2024-04-21 DIAGNOSIS — R29898 Other symptoms and signs involving the musculoskeletal system: Secondary | ICD-10-CM | POA: Diagnosis not present

## 2024-04-23 DIAGNOSIS — R29898 Other symptoms and signs involving the musculoskeletal system: Secondary | ICD-10-CM | POA: Diagnosis not present

## 2024-04-23 DIAGNOSIS — M25612 Stiffness of left shoulder, not elsewhere classified: Secondary | ICD-10-CM | POA: Diagnosis not present

## 2024-04-23 DIAGNOSIS — M25512 Pain in left shoulder: Secondary | ICD-10-CM | POA: Diagnosis not present

## 2024-12-29 ENCOUNTER — Ambulatory Visit: Admitting: Family Medicine

## 2024-12-31 ENCOUNTER — Ambulatory Visit: Admitting: Family Medicine
